# Patient Record
Sex: Female | Born: 1990 | Race: White | Hispanic: No | Marital: Married | Smoking: Former smoker
Health system: Southern US, Community
[De-identification: ages and names within clinical notes are randomized; demographics above are authoritative.]

## PROBLEM LIST (undated history)

## (undated) DIAGNOSIS — J45909 Unspecified asthma, uncomplicated: Secondary | ICD-10-CM

## (undated) DIAGNOSIS — F988 Other specified behavioral and emotional disorders with onset usually occurring in childhood and adolescence: Secondary | ICD-10-CM

## (undated) DIAGNOSIS — K219 Gastro-esophageal reflux disease without esophagitis: Secondary | ICD-10-CM

## (undated) DIAGNOSIS — E119 Type 2 diabetes mellitus without complications: Secondary | ICD-10-CM

## (undated) DIAGNOSIS — K589 Irritable bowel syndrome without diarrhea: Secondary | ICD-10-CM

## (undated) DIAGNOSIS — N915 Oligomenorrhea, unspecified: Secondary | ICD-10-CM

## (undated) DIAGNOSIS — G43909 Migraine, unspecified, not intractable, without status migrainosus: Secondary | ICD-10-CM

## (undated) DIAGNOSIS — F329 Major depressive disorder, single episode, unspecified: Secondary | ICD-10-CM

## (undated) DIAGNOSIS — N390 Urinary tract infection, site not specified: Secondary | ICD-10-CM

## (undated) DIAGNOSIS — F419 Anxiety disorder, unspecified: Secondary | ICD-10-CM

## (undated) DIAGNOSIS — Z22322 Carrier or suspected carrier of Methicillin resistant Staphylococcus aureus: Secondary | ICD-10-CM

## (undated) DIAGNOSIS — K311 Adult hypertrophic pyloric stenosis: Secondary | ICD-10-CM

## (undated) DIAGNOSIS — F32A Depression, unspecified: Secondary | ICD-10-CM

## (undated) HISTORY — DX: Other specified behavioral and emotional disorders with onset usually occurring in childhood and adolescence: F98.8

## (undated) HISTORY — PX: WISDOM TOOTH EXTRACTION: SHX21

## (undated) HISTORY — DX: Anxiety disorder, unspecified: F41.9

## (undated) HISTORY — DX: Oligomenorrhea, unspecified: N91.5

## (undated) HISTORY — DX: Depression, unspecified: F32.A

## (undated) HISTORY — DX: Major depressive disorder, single episode, unspecified: F32.9

## (undated) HISTORY — PX: COLPOSCOPY: SHX161

## (undated) HISTORY — DX: Irritable bowel syndrome, unspecified: K58.9

## (undated) HISTORY — PX: TONSILLECTOMY: SUR1361

## (undated) HISTORY — DX: Gastro-esophageal reflux disease without esophagitis: K21.9

## (undated) HISTORY — PX: DILATION AND CURETTAGE OF UTERUS: SHX78

---

## 2010-05-12 ENCOUNTER — Emergency Department: Payer: Self-pay | Admitting: Emergency Medicine

## 2010-05-23 ENCOUNTER — Emergency Department: Payer: Self-pay | Admitting: Emergency Medicine

## 2010-06-22 ENCOUNTER — Emergency Department (HOSPITAL_COMMUNITY)
Admission: EM | Admit: 2010-06-22 | Discharge: 2010-06-22 | Payer: Self-pay | Source: Home / Self Care | Admitting: Emergency Medicine

## 2010-06-24 LAB — POCT PREGNANCY, URINE: Preg Test, Ur: NEGATIVE

## 2010-07-09 ENCOUNTER — Emergency Department: Payer: Self-pay | Admitting: Emergency Medicine

## 2010-08-16 ENCOUNTER — Emergency Department: Payer: Self-pay | Admitting: Emergency Medicine

## 2010-10-01 ENCOUNTER — Ambulatory Visit: Payer: Self-pay | Admitting: Family Medicine

## 2010-10-21 ENCOUNTER — Ambulatory Visit: Payer: Self-pay | Admitting: Gastroenterology

## 2010-10-22 LAB — PATHOLOGY REPORT

## 2010-10-23 ENCOUNTER — Ambulatory Visit: Payer: Self-pay | Admitting: Gastroenterology

## 2010-11-18 ENCOUNTER — Emergency Department: Payer: Self-pay

## 2010-12-21 ENCOUNTER — Ambulatory Visit: Payer: Self-pay | Admitting: Gastroenterology

## 2011-01-22 ENCOUNTER — Emergency Department: Payer: Self-pay | Admitting: Emergency Medicine

## 2011-04-19 ENCOUNTER — Emergency Department: Payer: Self-pay | Admitting: Emergency Medicine

## 2011-07-22 ENCOUNTER — Emergency Department: Payer: Self-pay | Admitting: Unknown Physician Specialty

## 2011-07-22 LAB — URINALYSIS, COMPLETE
Bacteria: NONE SEEN
Bilirubin,UR: NEGATIVE
Blood: NEGATIVE
Glucose,UR: NEGATIVE mg/dL (ref 0–75)
Ketone: NEGATIVE
Leukocyte Esterase: NEGATIVE
Nitrite: NEGATIVE
Ph: 8 (ref 4.5–8.0)
Protein: NEGATIVE
RBC,UR: 1 /HPF (ref 0–5)
Specific Gravity: 1.006 (ref 1.003–1.030)
Squamous Epithelial: 1
WBC UR: <1 /HPF (ref 0–5)

## 2011-07-22 LAB — PREGNANCY, URINE: Pregnancy Test, Urine: NEGATIVE m[IU]/mL

## 2011-07-23 LAB — COMPREHENSIVE METABOLIC PANEL
Albumin: 3.7 g/dL (ref 3.4–5.0)
Alkaline Phosphatase: 101 U/L (ref 50–136)
Anion Gap: 12 (ref 7–16)
BUN: 11 mg/dL (ref 7–18)
Bilirubin,Total: 0.3 mg/dL (ref 0.2–1.0)
Calcium, Total: 8.7 mg/dL (ref 8.5–10.1)
Chloride: 106 mmol/L (ref 98–107)
Co2: 23 mmol/L (ref 21–32)
Creatinine: 0.79 mg/dL (ref 0.60–1.30)
EGFR (African American): >60
EGFR (Non-African Amer.): >60
Glucose: 99 mg/dL (ref 65–99)
Osmolality: 281 (ref 275–301)
Potassium: 3.9 mmol/L (ref 3.5–5.1)
SGOT(AST): 19 U/L (ref 15–37)
SGPT (ALT): 24 U/L
Sodium: 141 mmol/L (ref 136–145)
Total Protein: 7.5 g/dL (ref 6.4–8.2)

## 2011-07-23 LAB — CBC
HCT: 38.6 % (ref 35.0–47.0)
HGB: 12.9 g/dL (ref 12.0–16.0)
MCH: 27.1 pg (ref 26.0–34.0)
MCHC: 33.4 g/dL (ref 32.0–36.0)
MCV: 81 fL (ref 80–100)
Platelet: 226 10*3/uL (ref 150–440)
RBC: 4.76 10*6/uL (ref 3.80–5.20)
RDW: 15.5 % — ABNORMAL HIGH (ref 11.5–14.5)
WBC: 11.5 10*3/uL — ABNORMAL HIGH (ref 3.6–11.0)

## 2011-07-23 LAB — HCG, QUANTITATIVE, PREGNANCY: Beta Hcg, Quant.: <1 m[IU]/mL — ABNORMAL LOW

## 2011-07-23 LAB — WET PREP, GENITAL

## 2011-08-23 ENCOUNTER — Emergency Department: Payer: Self-pay | Admitting: Internal Medicine

## 2011-08-23 LAB — PREGNANCY, URINE: Pregnancy Test, Urine: NEGATIVE m[IU]/mL

## 2011-10-17 ENCOUNTER — Emergency Department: Payer: Self-pay | Admitting: Emergency Medicine

## 2011-11-01 ENCOUNTER — Emergency Department: Payer: Self-pay | Admitting: Emergency Medicine

## 2011-12-09 ENCOUNTER — Emergency Department: Payer: Self-pay | Admitting: Emergency Medicine

## 2011-12-09 LAB — PREGNANCY, URINE: Pregnancy Test, Urine: NEGATIVE m[IU]/mL

## 2012-01-01 ENCOUNTER — Emergency Department: Payer: Self-pay | Admitting: Emergency Medicine

## 2012-01-01 LAB — URINALYSIS, COMPLETE
Bacteria: NONE SEEN
Bilirubin,UR: NEGATIVE
Blood: NEGATIVE
Glucose,UR: NEGATIVE mg/dL (ref 0–75)
Ketone: NEGATIVE
Leukocyte Esterase: NEGATIVE
Nitrite: NEGATIVE
Ph: 6 (ref 4.5–8.0)
Protein: NEGATIVE
RBC,UR: NONE SEEN /HPF (ref 0–5)
Specific Gravity: 1.016 (ref 1.003–1.030)
Squamous Epithelial: 5
WBC UR: NONE SEEN /HPF (ref 0–5)

## 2012-01-01 LAB — COMPREHENSIVE METABOLIC PANEL
Albumin: 3.5 g/dL (ref 3.4–5.0)
Alkaline Phosphatase: 106 U/L (ref 50–136)
Anion Gap: 9 (ref 7–16)
BUN: 9 mg/dL (ref 7–18)
Bilirubin,Total: 0.2 mg/dL (ref 0.2–1.0)
Calcium, Total: 8.8 mg/dL (ref 8.5–10.1)
Chloride: 107 mmol/L (ref 98–107)
Co2: 25 mmol/L (ref 21–32)
Creatinine: 0.73 mg/dL (ref 0.60–1.30)
EGFR (African American): >60
EGFR (Non-African Amer.): >60
Glucose: 95 mg/dL (ref 65–99)
Osmolality: 280 (ref 275–301)
Potassium: 3.8 mmol/L (ref 3.5–5.1)
SGOT(AST): 16 U/L (ref 15–37)
SGPT (ALT): 19 U/L
Sodium: 141 mmol/L (ref 136–145)
Total Protein: 7.1 g/dL (ref 6.4–8.2)

## 2012-01-01 LAB — CBC
HCT: 37.4 % (ref 35.0–47.0)
HGB: 12.4 g/dL (ref 12.0–16.0)
MCH: 27.2 pg (ref 26.0–34.0)
MCHC: 33.1 g/dL (ref 32.0–36.0)
MCV: 82 fL (ref 80–100)
Platelet: 234 10*3/uL (ref 150–440)
RBC: 4.55 10*6/uL (ref 3.80–5.20)
RDW: 16 % — ABNORMAL HIGH (ref 11.5–14.5)
WBC: 13.5 10*3/uL — ABNORMAL HIGH (ref 3.6–11.0)

## 2012-01-01 LAB — HCG, QUANTITATIVE, PREGNANCY: Beta Hcg, Quant.: 13898 m[IU]/mL — ABNORMAL HIGH

## 2012-01-01 LAB — LIPASE, BLOOD: Lipase: 120 U/L (ref 73–393)

## 2012-01-04 ENCOUNTER — Emergency Department: Payer: Self-pay | Admitting: Emergency Medicine

## 2012-01-04 LAB — HCG, QUANTITATIVE, PREGNANCY: Beta Hcg, Quant.: 21284 m[IU]/mL — ABNORMAL HIGH

## 2012-01-04 LAB — CBC
HCT: 38.3 % (ref 35.0–47.0)
HGB: 13 g/dL (ref 12.0–16.0)
MCH: 27.8 pg (ref 26.0–34.0)
MCHC: 33.9 g/dL (ref 32.0–36.0)
MCV: 82 fL (ref 80–100)
Platelet: 241 10*3/uL (ref 150–440)
RBC: 4.67 10*6/uL (ref 3.80–5.20)
RDW: 15.4 % — ABNORMAL HIGH (ref 11.5–14.5)
WBC: 10.2 10*3/uL (ref 3.6–11.0)

## 2012-01-05 LAB — URINALYSIS, COMPLETE
Bilirubin,UR: NEGATIVE
Glucose,UR: NEGATIVE mg/dL (ref 0–75)
Ketone: NEGATIVE
Leukocyte Esterase: NEGATIVE
Nitrite: NEGATIVE
Ph: 6 (ref 4.5–8.0)
Protein: NEGATIVE
RBC,UR: <1 /HPF (ref 0–5)
Specific Gravity: 1.01 (ref 1.003–1.030)
Squamous Epithelial: NONE SEEN
WBC UR: 1 /HPF (ref 0–5)

## 2012-01-13 ENCOUNTER — Encounter: Payer: Self-pay | Admitting: Obstetrics and Gynecology

## 2012-01-28 ENCOUNTER — Emergency Department: Payer: Self-pay | Admitting: Unknown Physician Specialty

## 2012-01-29 LAB — URINALYSIS, COMPLETE
Bacteria: NONE SEEN
Bilirubin,UR: NEGATIVE
Blood: NEGATIVE
Glucose,UR: NEGATIVE mg/dL (ref 0–75)
Ketone: NEGATIVE
Leukocyte Esterase: NEGATIVE
Nitrite: NEGATIVE
Ph: 6 (ref 4.5–8.0)
Protein: NEGATIVE
RBC,UR: <1 /HPF (ref 0–5)
Specific Gravity: 1.004 (ref 1.003–1.030)
Squamous Epithelial: <1
WBC UR: NONE SEEN /HPF (ref 0–5)

## 2012-01-29 LAB — COMPREHENSIVE METABOLIC PANEL
Albumin: 3.5 g/dL (ref 3.4–5.0)
Alkaline Phosphatase: 84 U/L (ref 50–136)
Anion Gap: 8 (ref 7–16)
BUN: 9 mg/dL (ref 7–18)
Bilirubin,Total: 0.1 mg/dL — ABNORMAL LOW (ref 0.2–1.0)
Calcium, Total: 9.1 mg/dL (ref 8.5–10.1)
Chloride: 107 mmol/L (ref 98–107)
Co2: 24 mmol/L (ref 21–32)
Creatinine: 0.62 mg/dL (ref 0.60–1.30)
EGFR (African American): >60
EGFR (Non-African Amer.): >60
Glucose: 88 mg/dL (ref 65–99)
Osmolality: 276 (ref 275–301)
Potassium: 3.8 mmol/L (ref 3.5–5.1)
SGOT(AST): 19 U/L (ref 15–37)
SGPT (ALT): 21 U/L (ref 12–78)
Sodium: 139 mmol/L (ref 136–145)
Total Protein: 7.4 g/dL (ref 6.4–8.2)

## 2012-01-29 LAB — HCG, QUANTITATIVE, PREGNANCY: Beta Hcg, Quant.: 33403 m[IU]/mL — ABNORMAL HIGH

## 2012-01-29 LAB — PREGNANCY, URINE: Pregnancy Test, Urine: POSITIVE m[IU]/mL

## 2012-01-29 LAB — CBC
HCT: 37.5 % (ref 35.0–47.0)
HGB: 12.4 g/dL (ref 12.0–16.0)
MCH: 27.2 pg (ref 26.0–34.0)
MCHC: 33.1 g/dL (ref 32.0–36.0)
MCV: 82 fL (ref 80–100)
Platelet: 239 10*3/uL (ref 150–440)
RBC: 4.57 10*6/uL (ref 3.80–5.20)
RDW: 15.4 % — ABNORMAL HIGH (ref 11.5–14.5)
WBC: 13.2 10*3/uL — ABNORMAL HIGH (ref 3.6–11.0)

## 2012-03-01 ENCOUNTER — Emergency Department: Payer: Self-pay | Admitting: Emergency Medicine

## 2012-03-01 LAB — CBC
HCT: 36.5 % (ref 35.0–47.0)
HGB: 12.5 g/dL (ref 12.0–16.0)
MCH: 28.2 pg (ref 26.0–34.0)
MCHC: 34.3 g/dL (ref 32.0–36.0)
MCV: 82 fL (ref 80–100)
Platelet: 214 10*3/uL (ref 150–440)
RBC: 4.44 10*6/uL (ref 3.80–5.20)
RDW: 15.1 % — ABNORMAL HIGH (ref 11.5–14.5)
WBC: 12.7 10*3/uL — ABNORMAL HIGH (ref 3.6–11.0)

## 2012-03-01 LAB — URINALYSIS, COMPLETE
Bilirubin,UR: NEGATIVE
Blood: NEGATIVE
Glucose,UR: NEGATIVE mg/dL (ref 0–75)
Ketone: NEGATIVE
Leukocyte Esterase: NEGATIVE
Nitrite: NEGATIVE
Ph: 6 (ref 4.5–8.0)
Protein: NEGATIVE
RBC,UR: 4 /HPF (ref 0–5)
Specific Gravity: 1.016 (ref 1.003–1.030)
Squamous Epithelial: 1
WBC UR: 1 /HPF (ref 0–5)

## 2012-03-01 LAB — HCG, QUANTITATIVE, PREGNANCY: Beta Hcg, Quant.: 19413 m[IU]/mL — ABNORMAL HIGH

## 2012-03-22 ENCOUNTER — Emergency Department: Payer: Self-pay | Admitting: Emergency Medicine

## 2012-03-22 LAB — COMPREHENSIVE METABOLIC PANEL
Albumin: 3.1 g/dL — ABNORMAL LOW (ref 3.4–5.0)
Alkaline Phosphatase: 86 U/L (ref 50–136)
Anion Gap: 10 (ref 7–16)
BUN: 5 mg/dL — ABNORMAL LOW (ref 7–18)
Bilirubin,Total: 0.2 mg/dL (ref 0.2–1.0)
Calcium, Total: 9.1 mg/dL (ref 8.5–10.1)
Chloride: 109 mmol/L — ABNORMAL HIGH (ref 98–107)
Co2: 22 mmol/L (ref 21–32)
Creatinine: 0.43 mg/dL — ABNORMAL LOW (ref 0.60–1.30)
EGFR (African American): >60
EGFR (Non-African Amer.): >60
Glucose: 84 mg/dL (ref 65–99)
Osmolality: 278 (ref 275–301)
Potassium: 3.9 mmol/L (ref 3.5–5.1)
SGOT(AST): 6 U/L — ABNORMAL LOW (ref 15–37)
SGPT (ALT): 13 U/L (ref 12–78)
Sodium: 141 mmol/L (ref 136–145)
Total Protein: 7.1 g/dL (ref 6.4–8.2)

## 2012-03-22 LAB — URINALYSIS, COMPLETE
Bacteria: NONE SEEN
Bilirubin,UR: NEGATIVE
Blood: NEGATIVE
Glucose,UR: NEGATIVE mg/dL (ref 0–75)
Ketone: NEGATIVE
Leukocyte Esterase: NEGATIVE
Nitrite: NEGATIVE
Ph: 8 (ref 4.5–8.0)
Protein: NEGATIVE
RBC,UR: NONE SEEN /HPF (ref 0–5)
Specific Gravity: 1.014 (ref 1.003–1.030)
Squamous Epithelial: 1
WBC UR: NONE SEEN /HPF (ref 0–5)

## 2012-03-22 LAB — CBC
HCT: 37.5 % (ref 35.0–47.0)
HGB: 12.5 g/dL (ref 12.0–16.0)
MCH: 27.6 pg (ref 26.0–34.0)
MCHC: 33.3 g/dL (ref 32.0–36.0)
MCV: 83 fL (ref 80–100)
Platelet: 223 10*3/uL (ref 150–440)
RBC: 4.53 10*6/uL (ref 3.80–5.20)
RDW: 15.9 % — ABNORMAL HIGH (ref 11.5–14.5)
WBC: 12.5 10*3/uL — ABNORMAL HIGH (ref 3.6–11.0)

## 2012-03-22 LAB — LIPASE, BLOOD: Lipase: 127 U/L (ref 73–393)

## 2012-04-13 ENCOUNTER — Ambulatory Visit: Payer: Self-pay | Admitting: Obstetrics & Gynecology

## 2012-05-04 ENCOUNTER — Observation Stay: Payer: Self-pay

## 2012-05-24 ENCOUNTER — Observation Stay: Payer: Self-pay

## 2012-05-25 LAB — URINALYSIS, COMPLETE
Bilirubin,UR: NEGATIVE
Blood: NEGATIVE
Glucose,UR: NEGATIVE mg/dL (ref 0–75)
Ketone: NEGATIVE
Leukocyte Esterase: NEGATIVE
Nitrite: NEGATIVE
Ph: 7 (ref 4.5–8.0)
Protein: NEGATIVE
RBC,UR: 1 /HPF (ref 0–5)
Specific Gravity: 1.011 (ref 1.003–1.030)
Squamous Epithelial: <1
WBC UR: <1 /HPF (ref 0–5)

## 2012-05-28 ENCOUNTER — Ambulatory Visit: Payer: Self-pay | Admitting: Neurology

## 2012-05-30 ENCOUNTER — Observation Stay: Payer: Self-pay | Admitting: Obstetrics and Gynecology

## 2012-05-30 LAB — URINALYSIS, COMPLETE
Bacteria: NONE SEEN
Bilirubin,UR: NEGATIVE
Blood: NEGATIVE
Glucose,UR: NEGATIVE mg/dL (ref 0–75)
Ketone: NEGATIVE
Leukocyte Esterase: NEGATIVE
Nitrite: NEGATIVE
Ph: 6 (ref 4.5–8.0)
Protein: NEGATIVE
RBC,UR: 1 /HPF (ref 0–5)
Specific Gravity: 1.021 (ref 1.003–1.030)
Squamous Epithelial: <1
WBC UR: <1 /HPF (ref 0–5)

## 2012-06-09 ENCOUNTER — Observation Stay: Payer: Self-pay | Admitting: Obstetrics and Gynecology

## 2012-06-10 LAB — URINALYSIS, COMPLETE
Bacteria: NONE SEEN
Bilirubin,UR: NEGATIVE
Blood: NEGATIVE
Glucose,UR: NEGATIVE mg/dL (ref 0–75)
Leukocyte Esterase: NEGATIVE
Nitrite: NEGATIVE
Ph: 6 (ref 4.5–8.0)
Protein: NEGATIVE
RBC,UR: <1 /HPF (ref 0–5)
Specific Gravity: 1.01 (ref 1.003–1.030)
Squamous Epithelial: 1
WBC UR: 1 /HPF (ref 0–5)

## 2012-06-10 LAB — FETAL FIBRONECTIN
Appearance: NORMAL
Fetal Fibronectin: NEGATIVE

## 2012-06-15 ENCOUNTER — Observation Stay: Payer: Self-pay

## 2012-06-22 ENCOUNTER — Observation Stay: Payer: Self-pay | Admitting: Obstetrics & Gynecology

## 2012-06-26 ENCOUNTER — Observation Stay: Payer: Self-pay

## 2012-07-04 ENCOUNTER — Observation Stay: Payer: Self-pay | Admitting: Obstetrics and Gynecology

## 2012-07-08 ENCOUNTER — Observation Stay: Payer: Self-pay | Admitting: Obstetrics and Gynecology

## 2012-07-10 ENCOUNTER — Observation Stay: Payer: Self-pay

## 2012-07-11 ENCOUNTER — Observation Stay: Payer: Self-pay | Admitting: Obstetrics & Gynecology

## 2012-07-15 IMAGING — NM NUCLEAR MEDICINE HEPATOHBILIARY INCLUDE GB
2 series · 12 of 12 positions shown · non-contrast
Comparison: none

REASON FOR EXAM: abd pain
COMMENTS:

[Series 1000: gallbladder dynamic (results) · 4.80mm/px · 6 of 60 frames shown]
[frame 6/60]
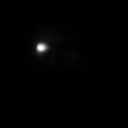
[frame 16/60]
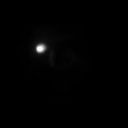
[frame 26/60]
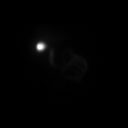
[frame 36/60]
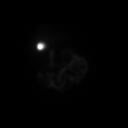
[frame 46/60]
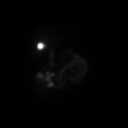
[frame 56/60]
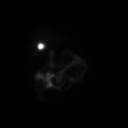

[Series 1000: gallbladder dynamic · 4.80mm/px · 6 of 60 frames shown]
[frame 6/60]
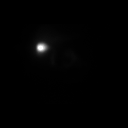
[frame 16/60]
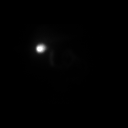
[frame 26/60]
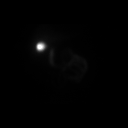
[frame 36/60]
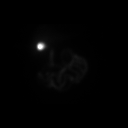
[frame 46/60]
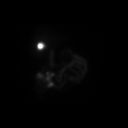
[frame 56/60]
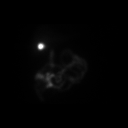

[12 of 12 positions shown; findings below may reference images not displayed]

PROCEDURE:     KNM - KNM HEPATO W/GB EJECT FRACTION  - October 23, 2010 [DATE]

RESULT:     The patient received a dose of 8.52 mCi of technetium 99m
Choletec. Images demonstrate prompt extraction of tracer from the blood pool
by the liver with gallbladder activity first seen by 15 minutes after
injection with common bile duct activity present by 15 to 20 minutes after
injection. Bowel localization is present by approximately 50 to 60 minutes
after injection. A continuous infusion of sincalide was initiated with a
total dose of 2.31 mcg administered intravenously. The gallbladder ejection
fraction in response to sincalide infusion is 69%.
IMPRESSION: 1.     Normal-appearing hepatobiliary scan and gallbladder ejection fraction.

## 2012-07-24 ENCOUNTER — Observation Stay: Payer: Self-pay

## 2012-08-01 ENCOUNTER — Observation Stay: Payer: Self-pay | Admitting: Obstetrics & Gynecology

## 2012-08-04 ENCOUNTER — Observation Stay: Payer: Self-pay | Admitting: Obstetrics and Gynecology

## 2012-08-08 ENCOUNTER — Observation Stay: Payer: Self-pay | Admitting: Obstetrics & Gynecology

## 2012-08-12 ENCOUNTER — Observation Stay: Payer: Self-pay | Admitting: Obstetrics and Gynecology

## 2012-08-13 ENCOUNTER — Inpatient Hospital Stay: Payer: Self-pay | Admitting: Obstetrics and Gynecology

## 2012-08-13 LAB — CBC WITH DIFFERENTIAL/PLATELET
Basophil #: 0.1 10*3/uL (ref 0.0–0.1)
Basophil %: 0.4 %
Eosinophil #: 0.1 10*3/uL (ref 0.0–0.7)
Eosinophil %: 0.3 %
HCT: 34.8 % — ABNORMAL LOW (ref 35.0–47.0)
HGB: 11.4 g/dL — ABNORMAL LOW (ref 12.0–16.0)
Lymphocyte #: 2.1 10*3/uL (ref 1.0–3.6)
Lymphocyte %: 12.2 %
MCH: 26 pg (ref 26.0–34.0)
MCHC: 32.7 g/dL (ref 32.0–36.0)
MCV: 79 fL — ABNORMAL LOW (ref 80–100)
Monocyte #: 0.8 x10 3/mm (ref 0.2–0.9)
Monocyte %: 4.5 %
Neutrophil #: 14.3 10*3/uL — ABNORMAL HIGH (ref 1.4–6.5)
Neutrophil %: 82.6 %
Platelet: 273 10*3/uL (ref 150–440)
RBC: 4.38 10*6/uL (ref 3.80–5.20)
RDW: 15.1 % — ABNORMAL HIGH (ref 11.5–14.5)
WBC: 17.3 10*3/uL — ABNORMAL HIGH (ref 3.6–11.0)

## 2012-08-15 LAB — HEMATOCRIT: HCT: 30 % — ABNORMAL LOW (ref 35.0–47.0)

## 2012-08-27 ENCOUNTER — Emergency Department: Payer: Self-pay | Admitting: Emergency Medicine

## 2013-02-19 ENCOUNTER — Ambulatory Visit: Payer: Self-pay | Admitting: Specialist

## 2013-04-20 ENCOUNTER — Emergency Department: Payer: Self-pay | Admitting: Emergency Medicine

## 2013-04-20 LAB — COMPREHENSIVE METABOLIC PANEL
Albumin: 3.5 g/dL (ref 3.4–5.0)
Alkaline Phosphatase: 113 U/L (ref 50–136)
Anion Gap: 4 — ABNORMAL LOW (ref 7–16)
BUN: 16 mg/dL (ref 7–18)
Bilirubin,Total: 0.4 mg/dL (ref 0.2–1.0)
Calcium, Total: 8.8 mg/dL (ref 8.5–10.1)
Chloride: 112 mmol/L — ABNORMAL HIGH (ref 98–107)
Co2: 23 mmol/L (ref 21–32)
Creatinine: 0.78 mg/dL (ref 0.60–1.30)
EGFR (African American): >60
EGFR (Non-African Amer.): >60
Glucose: 144 mg/dL — ABNORMAL HIGH (ref 65–99)
Osmolality: 281 (ref 275–301)
Potassium: 3.4 mmol/L — ABNORMAL LOW (ref 3.5–5.1)
SGOT(AST): 17 U/L (ref 15–37)
SGPT (ALT): 23 U/L (ref 12–78)
Sodium: 139 mmol/L (ref 136–145)
Total Protein: 7.5 g/dL (ref 6.4–8.2)

## 2013-04-20 LAB — URINALYSIS, COMPLETE
Bilirubin,UR: NEGATIVE
Glucose,UR: NEGATIVE mg/dL (ref 0–75)
Ketone: NEGATIVE
Leukocyte Esterase: NEGATIVE
Nitrite: NEGATIVE
Ph: 5 (ref 4.5–8.0)
Protein: 30
RBC,UR: 7 /HPF (ref 0–5)
Specific Gravity: 1.025 (ref 1.003–1.030)
Squamous Epithelial: 1
WBC UR: 3 /HPF (ref 0–5)

## 2013-04-20 LAB — CBC
HCT: 37.3 % (ref 35.0–47.0)
HGB: 12.1 g/dL (ref 12.0–16.0)
MCH: 24.4 pg — ABNORMAL LOW (ref 26.0–34.0)
MCHC: 32.4 g/dL (ref 32.0–36.0)
MCV: 75 fL — ABNORMAL LOW (ref 80–100)
Platelet: 294 10*3/uL (ref 150–440)
RBC: 4.95 10*6/uL (ref 3.80–5.20)
RDW: 18.4 % — ABNORMAL HIGH (ref 11.5–14.5)
WBC: 10.3 10*3/uL (ref 3.6–11.0)

## 2013-04-20 LAB — PREGNANCY, URINE: Pregnancy Test, Urine: NEGATIVE m[IU]/mL

## 2013-04-20 LAB — GC/CHLAMYDIA PROBE AMP

## 2013-04-20 LAB — WET PREP, GENITAL

## 2013-04-27 ENCOUNTER — Emergency Department: Payer: Self-pay | Admitting: Emergency Medicine

## 2013-04-27 LAB — BASIC METABOLIC PANEL
Anion Gap: 6 — ABNORMAL LOW (ref 7–16)
BUN: 12 mg/dL (ref 7–18)
Calcium, Total: 8.6 mg/dL (ref 8.5–10.1)
Chloride: 110 mmol/L — ABNORMAL HIGH (ref 98–107)
Co2: 25 mmol/L (ref 21–32)
Creatinine: 0.84 mg/dL (ref 0.60–1.30)
EGFR (African American): >60
EGFR (Non-African Amer.): >60
Glucose: 104 mg/dL — ABNORMAL HIGH (ref 65–99)
Osmolality: 281 (ref 275–301)
Potassium: 3.8 mmol/L (ref 3.5–5.1)
Sodium: 141 mmol/L (ref 136–145)

## 2013-04-27 LAB — CBC
HCT: 36.9 % (ref 35.0–47.0)
HGB: 12.2 g/dL (ref 12.0–16.0)
MCH: 24.7 pg — ABNORMAL LOW (ref 26.0–34.0)
MCHC: 32.9 g/dL (ref 32.0–36.0)
MCV: 75 fL — ABNORMAL LOW (ref 80–100)
Platelet: 268 10*3/uL (ref 150–440)
RBC: 4.92 10*6/uL (ref 3.80–5.20)
RDW: 17.5 % — ABNORMAL HIGH (ref 11.5–14.5)
WBC: 10.1 10*3/uL (ref 3.6–11.0)

## 2013-04-27 LAB — TROPONIN I: Troponin-I: <0.02 ng/mL

## 2013-05-16 DIAGNOSIS — F32A Depression, unspecified: Secondary | ICD-10-CM | POA: Insufficient documentation

## 2013-05-16 DIAGNOSIS — F419 Anxiety disorder, unspecified: Secondary | ICD-10-CM | POA: Insufficient documentation

## 2013-06-07 HISTORY — PX: HYSTEROSCOPY: SHX211

## 2013-06-30 ENCOUNTER — Emergency Department: Payer: Self-pay | Admitting: Emergency Medicine

## 2013-07-03 DIAGNOSIS — K219 Gastro-esophageal reflux disease without esophagitis: Secondary | ICD-10-CM | POA: Insufficient documentation

## 2013-07-03 DIAGNOSIS — K5904 Chronic idiopathic constipation: Secondary | ICD-10-CM | POA: Insufficient documentation

## 2013-07-31 DIAGNOSIS — J329 Chronic sinusitis, unspecified: Secondary | ICD-10-CM | POA: Insufficient documentation

## 2013-07-31 DIAGNOSIS — J069 Acute upper respiratory infection, unspecified: Secondary | ICD-10-CM | POA: Insufficient documentation

## 2013-09-27 IMAGING — US US OB < 14 WEEKS - US OB TV
1 series · 13 of 28 positions shown · non-contrast
Comparison: none

REASON FOR EXAM: APPROX 7 WEEKS, VAG SPOTTING
COMMENTS:   May transport without cardiac monitor

PROCEDURE:     US  - US OB LESS THAN 14 WEEKS/W TRANS  - January 05, 2012  [DATE]
RESULT:     Comparison: None.
TECHNIQUE: Multiple grayscale and color Doppler images were obtained of the
pelvis via transabdominal and endovaginal ultrasound.

[Series 1: us ob < 14 weeks - us ob tv · 0.26mm/px · 83 acquisitions, 13 frames shown]
[im 4/83]
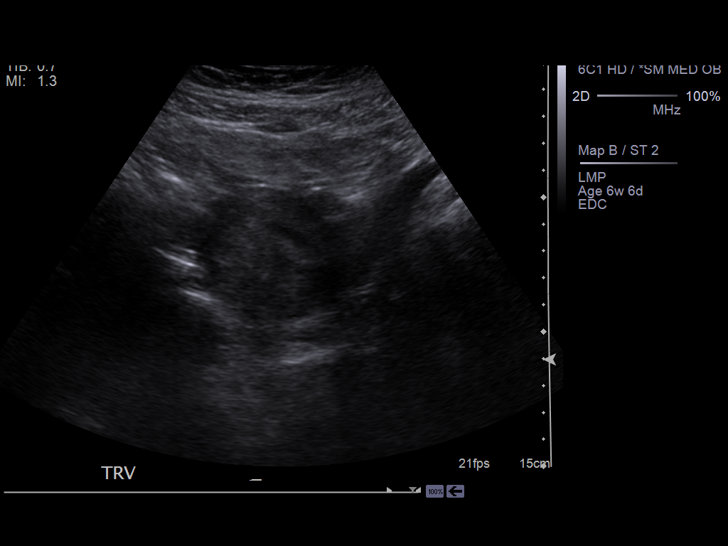
[im 10/83]
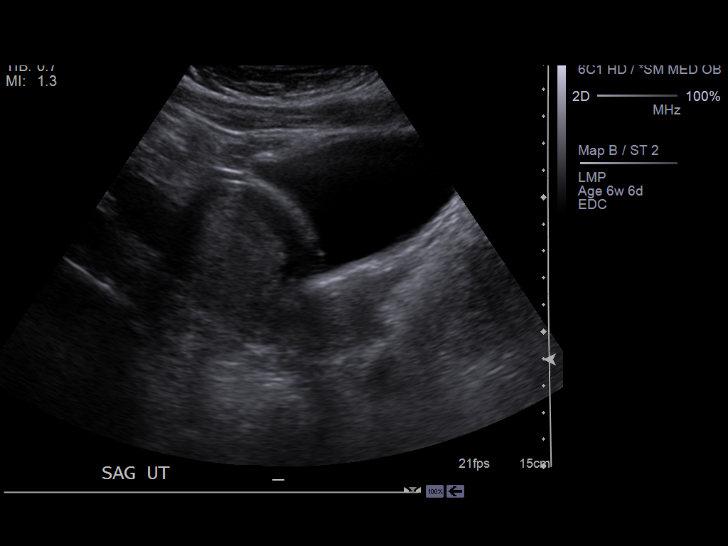
[im 16/83]
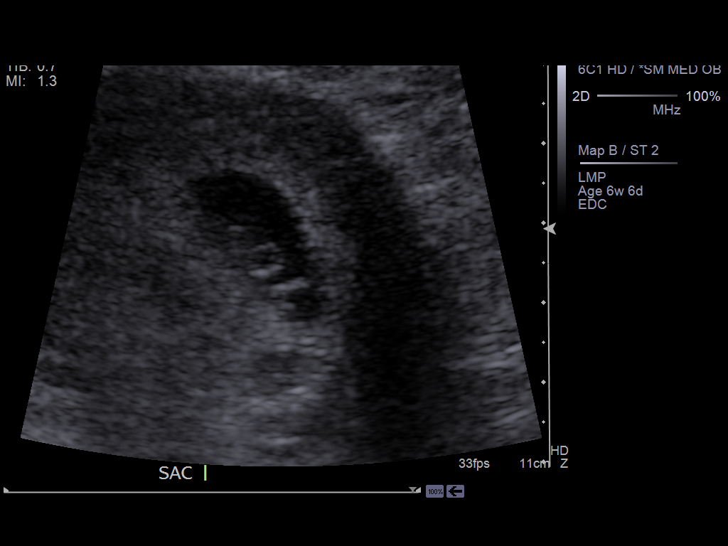
[im 22/83]
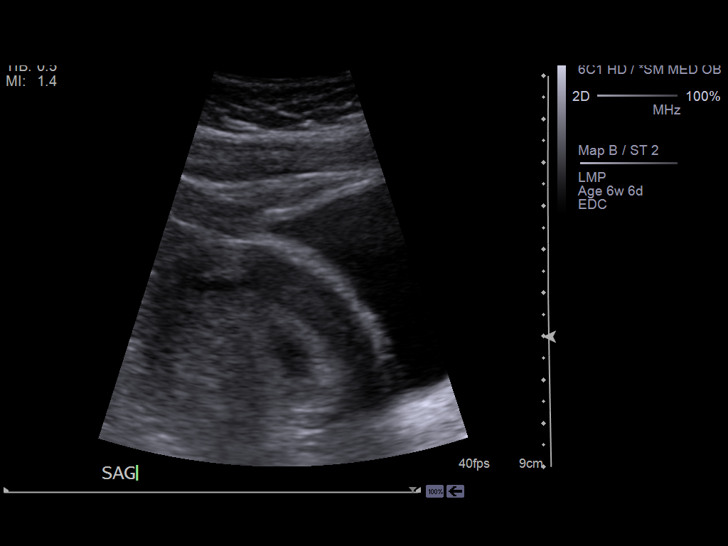
[im 28/83]
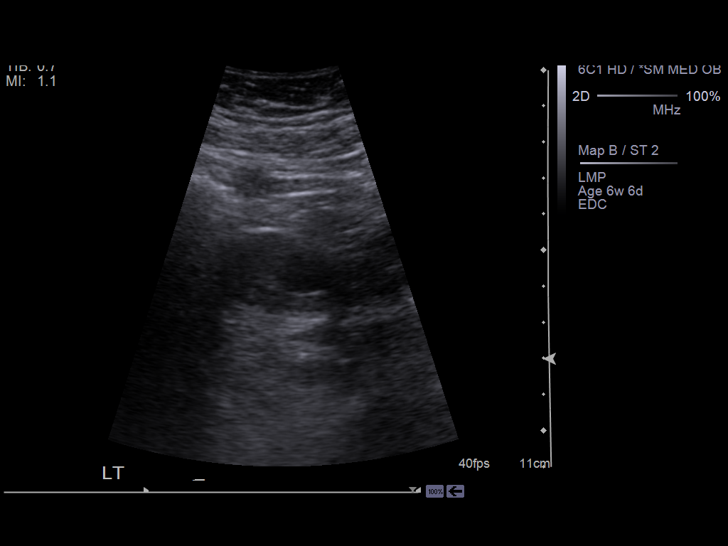
[im 34/83]
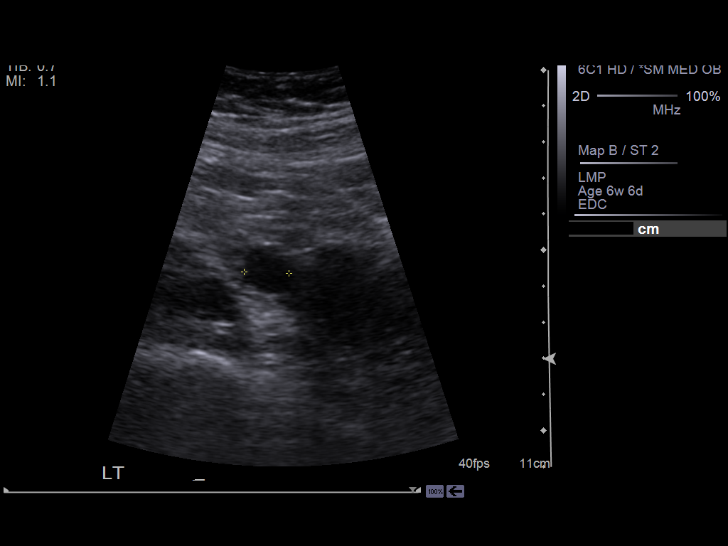
[im 43/83]
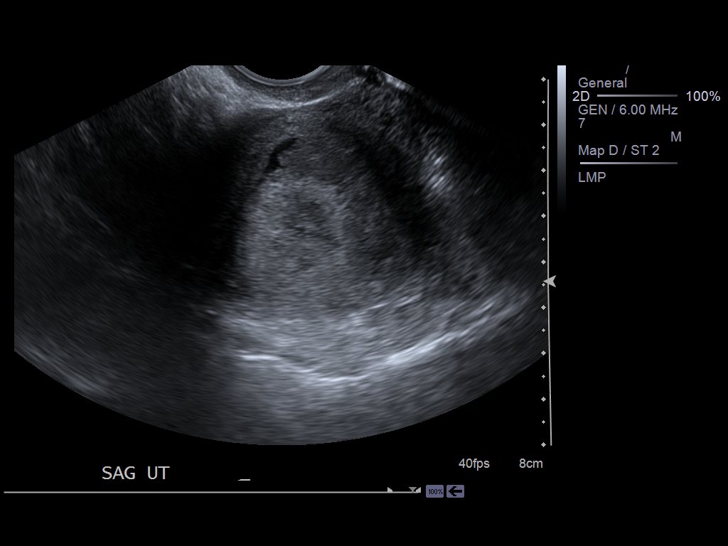
[im 49/83]
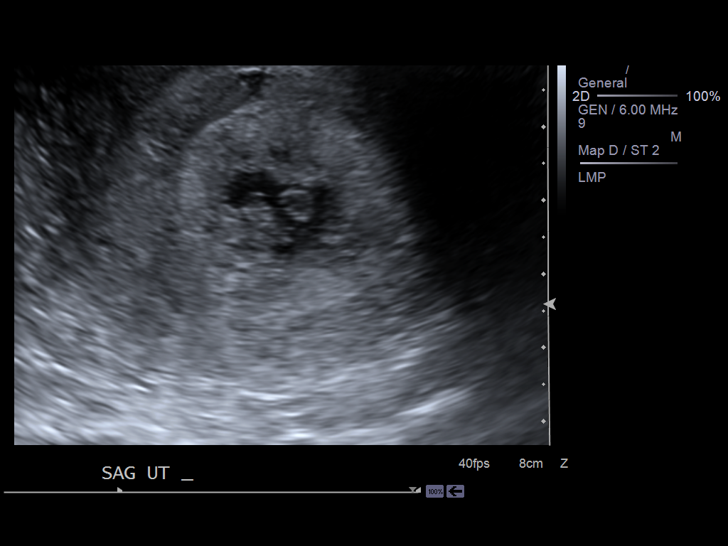
[im 55/83]
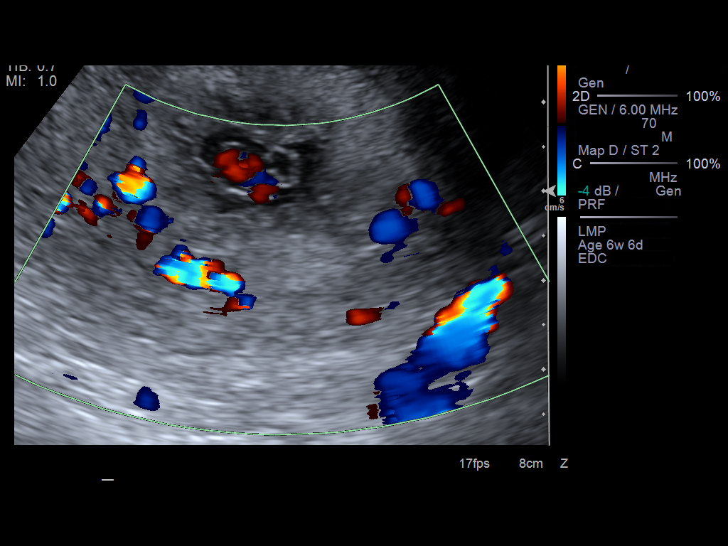
[im 61/83]
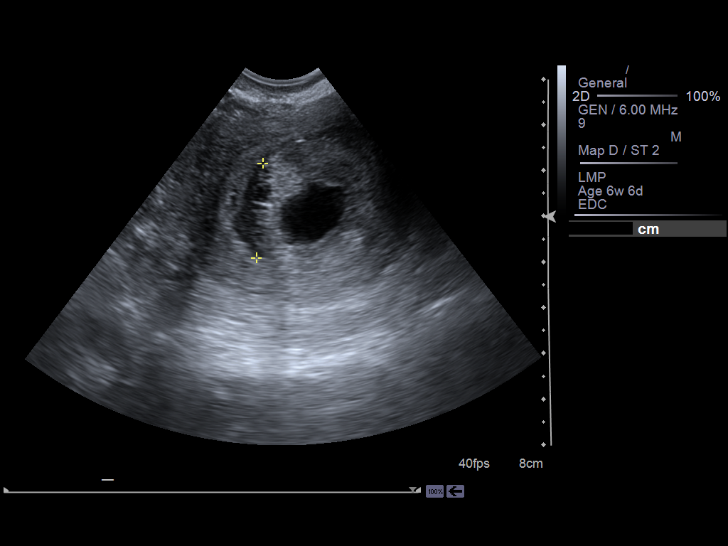
[im 67/83]
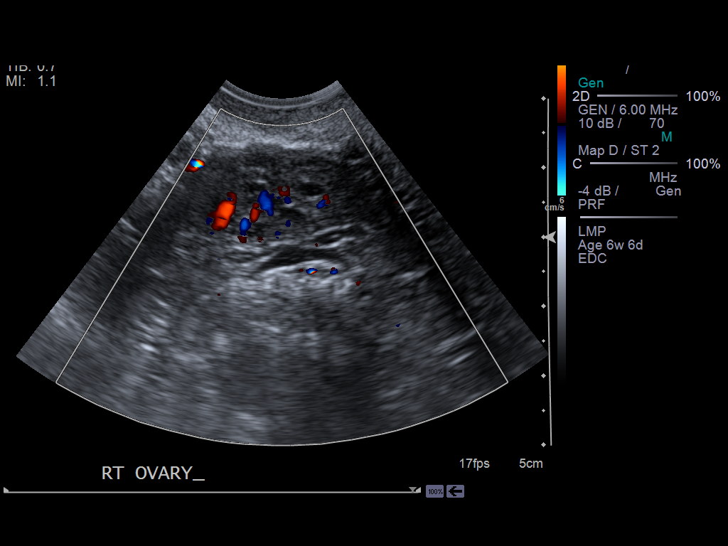
[im 73/83]
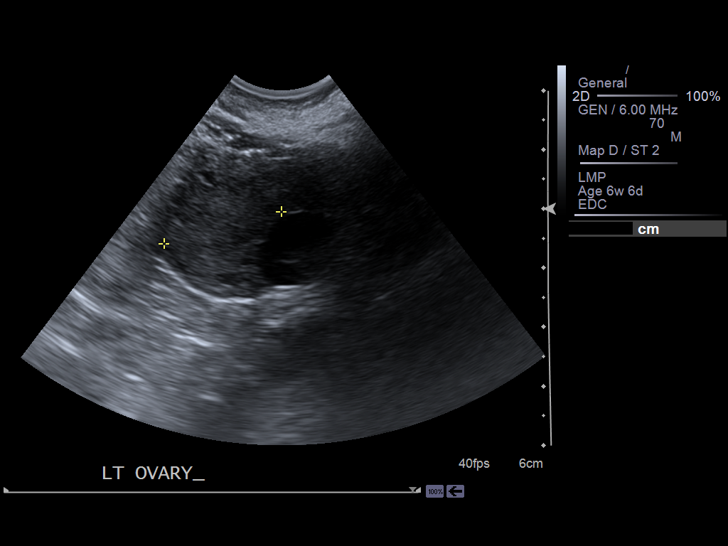
[im 79/83]
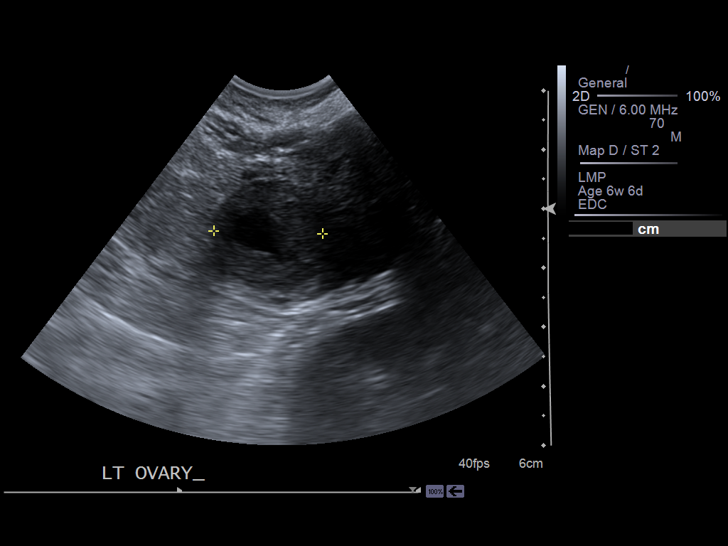

[13 of 28 positions shown; findings below may reference images not displayed]

FINDINGS: There is a gestational sac within the pelvis. A fetal pole is present, with
crown-rump length of 0.82 cm. This correlates with estimated gestational age
of 6 weeks 5 days. A yolk sac is present. Fetal heart rate was 131 beats per
minute. Small to moderate sized hypoechoic collection along the periphery of
the gestational sac likely represents a subchorionic hemorrhage. It measures
2.1 x 2.1 x 0.9 cm.

The right ovary measures 2.8 x 1.2 x 1.8 cm. The left ovary measures 2.1 x
1.6 x 1.8 cm. There is a complex cyst in the left ovary which likely
represents the corpus luteum cyst. There is a hypoechoic structure in the
left ovary which may represent a complicated cyst. It measures 1.6 x 1.5 x
1.1 cm.
IMPRESSION: 1. Single live intrauterine pregnancy with estimated gestational age of 6
weeks 5 days.
2. Small to moderate-sized subchorionic hemorrhage. Followup ultrasound is
suggested.
3. There is what likely represents a small complicated cyst in the left
ovary, in addition to the corpus luteum cyst. Followup ultrasound is
suggested to ensure resolution.

## 2013-10-19 ENCOUNTER — Ambulatory Visit: Payer: Self-pay | Admitting: Obstetrics and Gynecology

## 2013-10-27 ENCOUNTER — Emergency Department: Payer: Self-pay | Admitting: Emergency Medicine

## 2013-11-20 DIAGNOSIS — G4700 Insomnia, unspecified: Secondary | ICD-10-CM | POA: Insufficient documentation

## 2013-12-14 IMAGING — US US OB LIMITED
1 series · 14 of 24 positions shown · non-contrast
Comparison: none

REASON FOR EXAM: 18 weeks with vomiting and pelvic pain
COMMENTS:   May transport without cardiac monitor

[Series 1: us ob limited · 0.26mm/px · 14 of 24 slices shown]
[im 1/24]
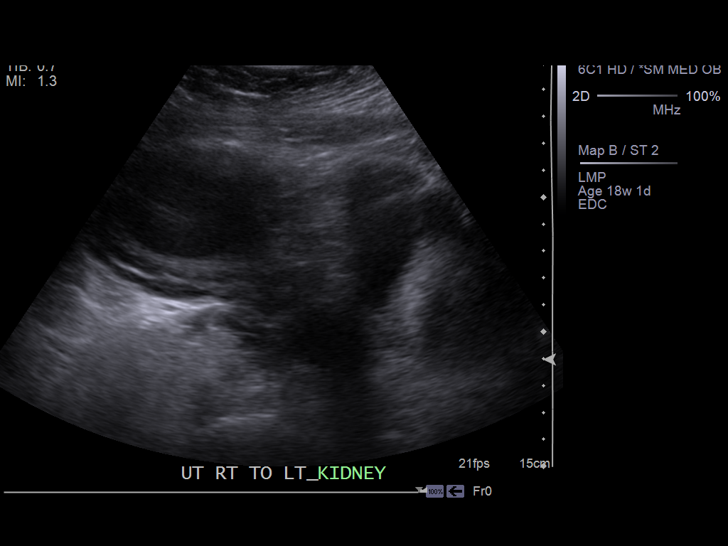
[im 3/24]
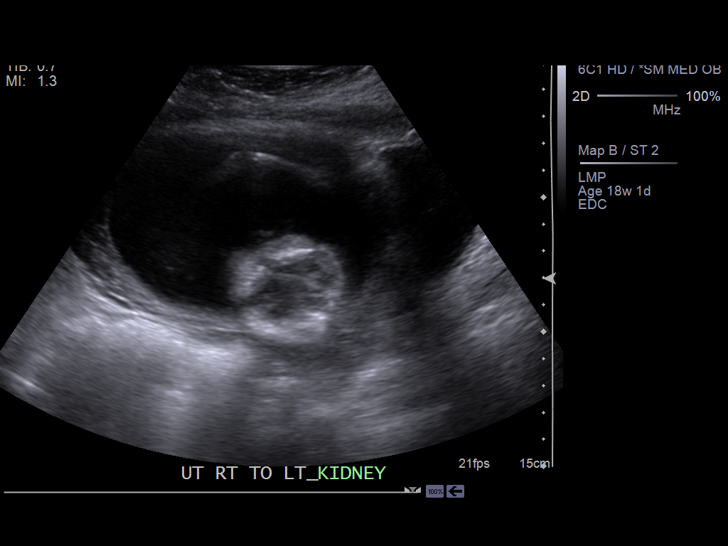
[im 5/24]
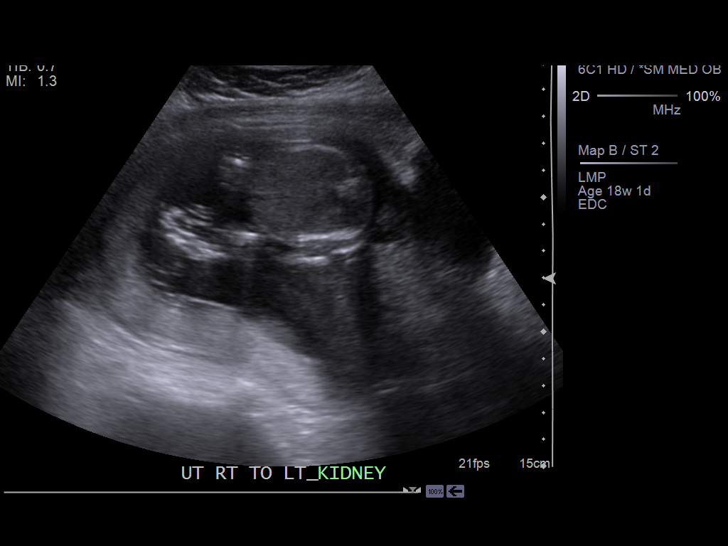
[im 7/24]
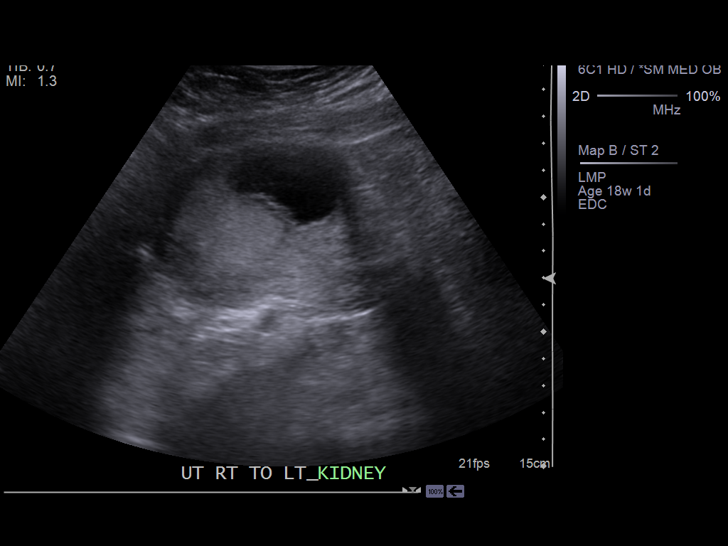
[im 8/24]
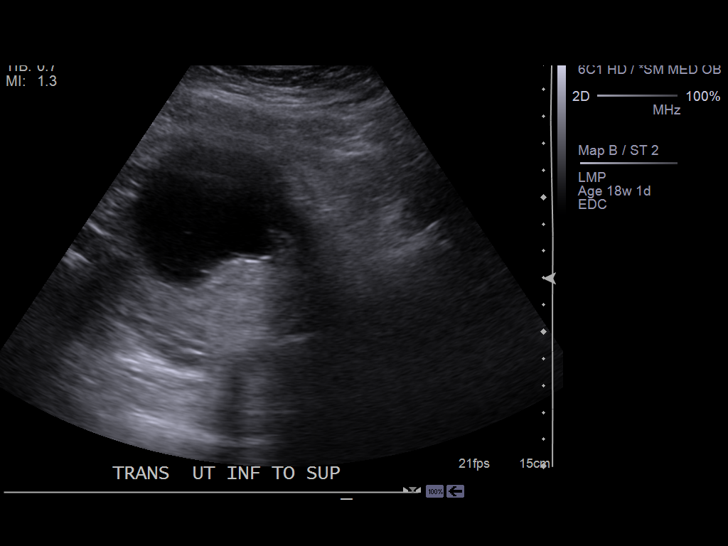
[im 10/24]
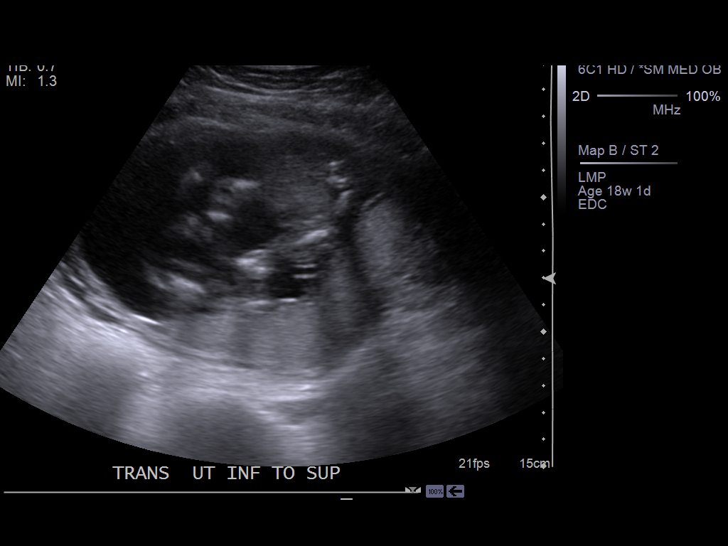
[im 12/24]
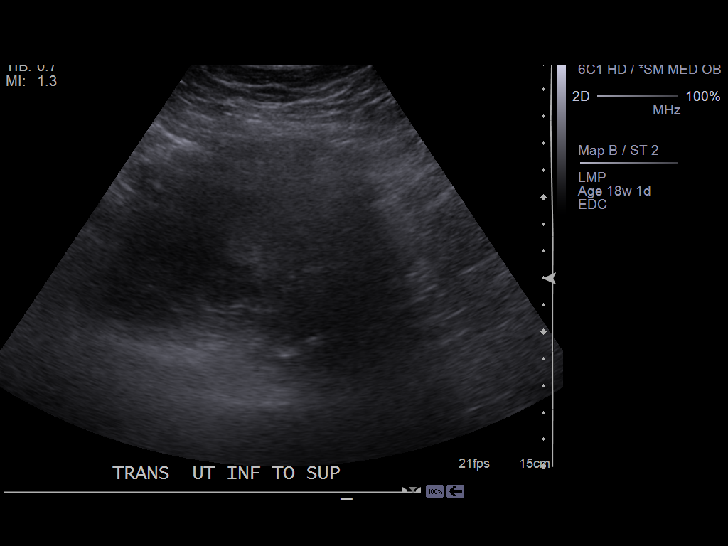
[im 13/24]
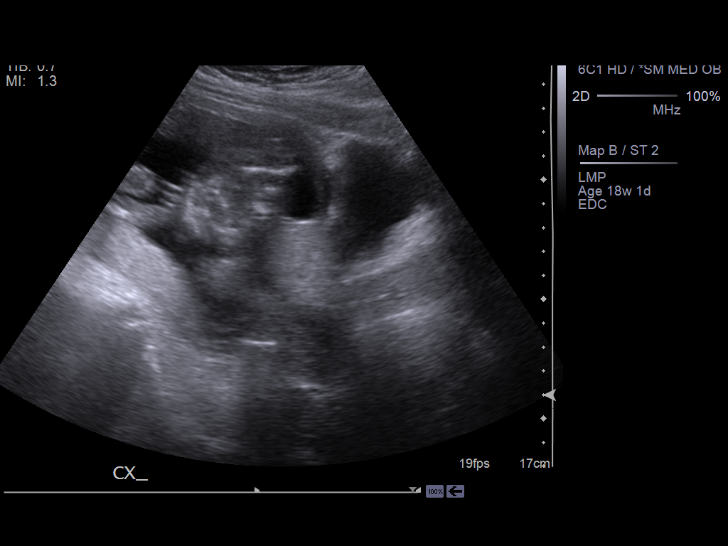
[im 15/24]
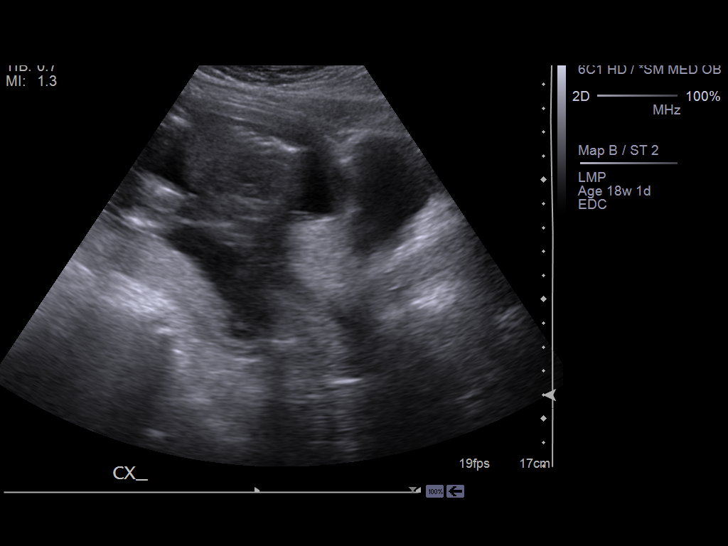
[im 17/24]
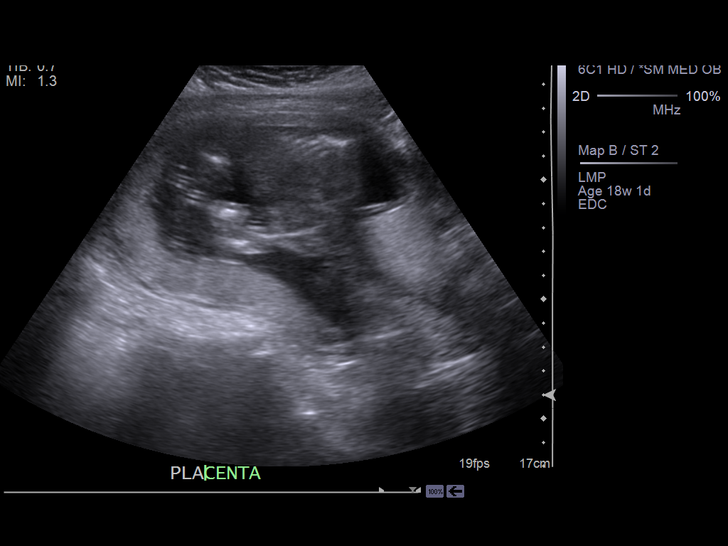
[im 19/24]
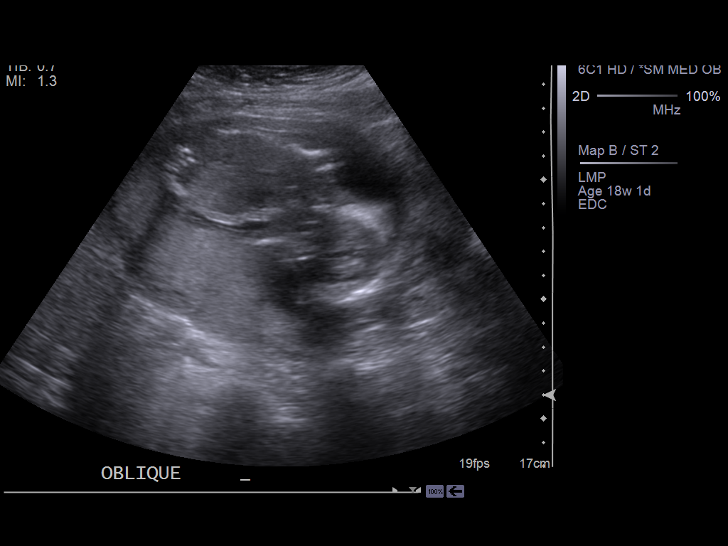
[im 20/24]
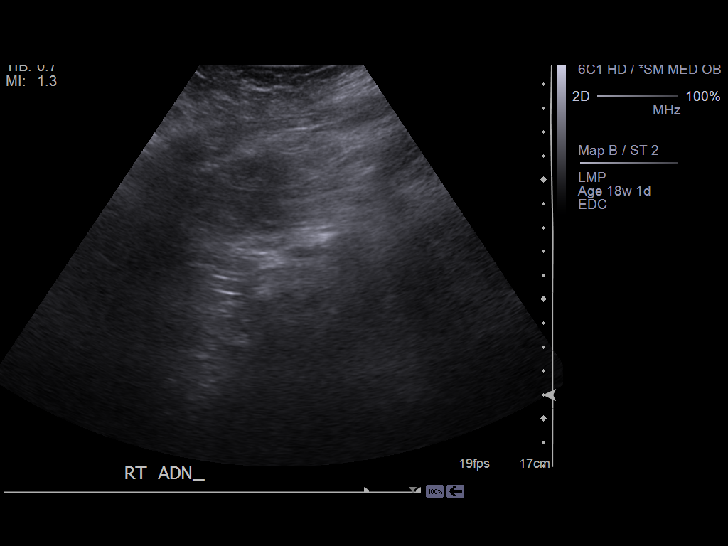
[im 22/24]
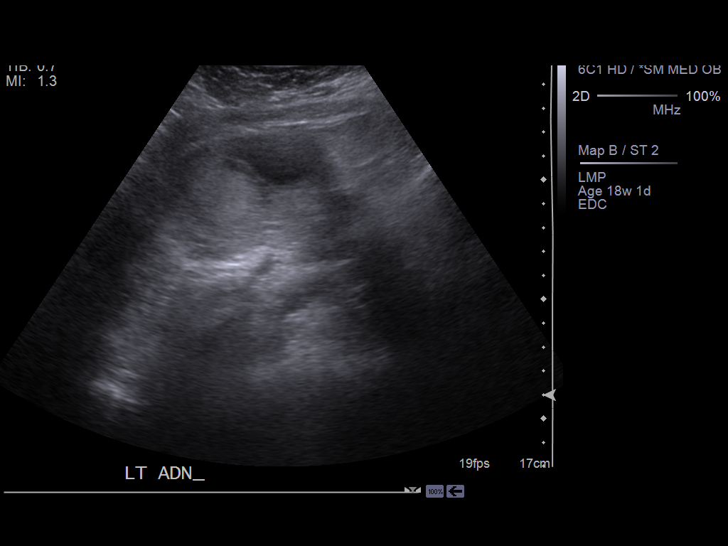
[im 24/24]
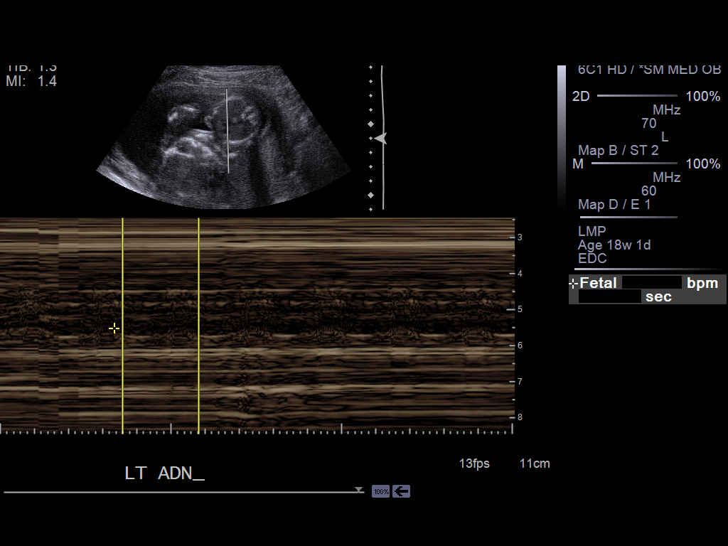

[14 of 24 positions shown; findings below may reference images not displayed]

PROCEDURE:     US  - US LIMITED OB  - March 23, 2012  [DATE]

RESULT:     Limited pelvic sonogram with limited OB protocol is performed
emergently demonstrating a single intrauterine gestational sac with a single
fetus showing a heart rate of 135 beats per minute. The placenta is
posterior in position with a grade 1 morphology with the inferior tip 3 cm
from the internal cervical os. The cervix is 4.6 cm in length and closed.
IMPRESSION: Single live intrauterine gestation as described above.

[REDACTED]

## 2014-01-02 ENCOUNTER — Emergency Department: Payer: Self-pay | Admitting: Emergency Medicine

## 2014-01-02 LAB — CBC WITH DIFFERENTIAL/PLATELET
Basophil #: 0.1 10*3/uL (ref 0.0–0.1)
Basophil %: 0.9 %
Eosinophil #: 0.1 10*3/uL (ref 0.0–0.7)
Eosinophil %: 1 %
HCT: 37 % (ref 35.0–47.0)
HGB: 11.6 g/dL — ABNORMAL LOW (ref 12.0–16.0)
Lymphocyte #: 3.8 10*3/uL — ABNORMAL HIGH (ref 1.0–3.6)
Lymphocyte %: 30.7 %
MCH: 25.1 pg — ABNORMAL LOW (ref 26.0–34.0)
MCHC: 31.3 g/dL — ABNORMAL LOW (ref 32.0–36.0)
MCV: 80 fL (ref 80–100)
Monocyte #: 0.7 x10 3/mm (ref 0.2–0.9)
Monocyte %: 5.8 %
Neutrophil #: 7.6 10*3/uL — ABNORMAL HIGH (ref 1.4–6.5)
Neutrophil %: 61.6 %
Platelet: 266 10*3/uL (ref 150–440)
RBC: 4.61 10*6/uL (ref 3.80–5.20)
RDW: 16.7 % — ABNORMAL HIGH (ref 11.5–14.5)
WBC: 12.3 10*3/uL — ABNORMAL HIGH (ref 3.6–11.0)

## 2014-01-02 LAB — COMPREHENSIVE METABOLIC PANEL
Albumin: 3.1 g/dL — ABNORMAL LOW (ref 3.4–5.0)
Alkaline Phosphatase: 108 U/L
Anion Gap: 6 — ABNORMAL LOW (ref 7–16)
BUN: 8 mg/dL (ref 7–18)
Bilirubin,Total: 0.2 mg/dL (ref 0.2–1.0)
Calcium, Total: 8.8 mg/dL (ref 8.5–10.1)
Chloride: 110 mmol/L — ABNORMAL HIGH (ref 98–107)
Co2: 26 mmol/L (ref 21–32)
Creatinine: 0.77 mg/dL (ref 0.60–1.30)
EGFR (African American): >60
EGFR (Non-African Amer.): >60
Glucose: 116 mg/dL — ABNORMAL HIGH (ref 65–99)
Osmolality: 282 (ref 275–301)
Potassium: 4 mmol/L (ref 3.5–5.1)
SGOT(AST): 20 U/L (ref 15–37)
SGPT (ALT): 24 U/L
Sodium: 142 mmol/L (ref 136–145)
Total Protein: 7.1 g/dL (ref 6.4–8.2)

## 2014-01-02 LAB — URINALYSIS, COMPLETE
Bilirubin,UR: NEGATIVE
Blood: NEGATIVE
Glucose,UR: NEGATIVE mg/dL (ref 0–75)
Ketone: NEGATIVE
Leukocyte Esterase: NEGATIVE
Nitrite: NEGATIVE
Ph: 7 (ref 4.5–8.0)
Protein: NEGATIVE
RBC,UR: <1 /HPF (ref 0–5)
Specific Gravity: 1.02 (ref 1.003–1.030)
Squamous Epithelial: 1
WBC UR: NONE SEEN /HPF (ref 0–5)

## 2014-01-03 LAB — GC/CHLAMYDIA PROBE AMP

## 2014-01-03 LAB — WET PREP, GENITAL

## 2014-01-04 IMAGING — US ABDOMEN ULTRASOUND
1 series · 13 of 25 positions shown · non-contrast
Comparison: none

REASON FOR EXAM: nausae vomiting RUQ pain  78wks pregnant
COMMENTS:

[Series 1: abdomen ultrasound · 0.31mm/px · 13 of 102 slices shown]
[im 1/102]
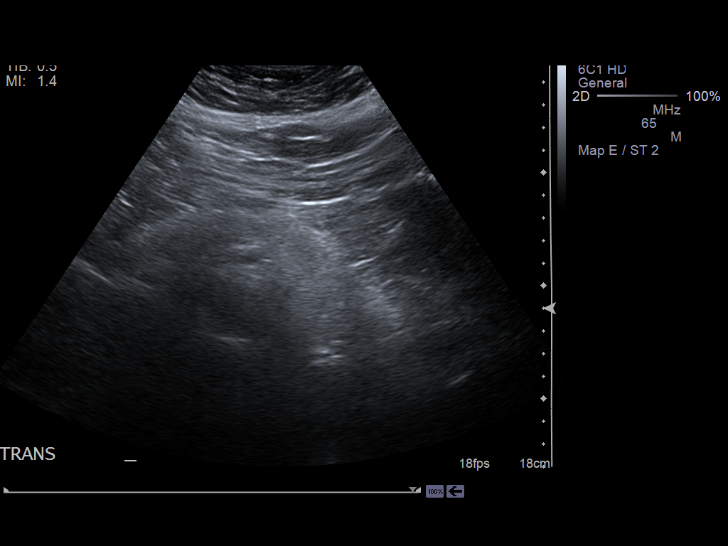
[im 9/102]
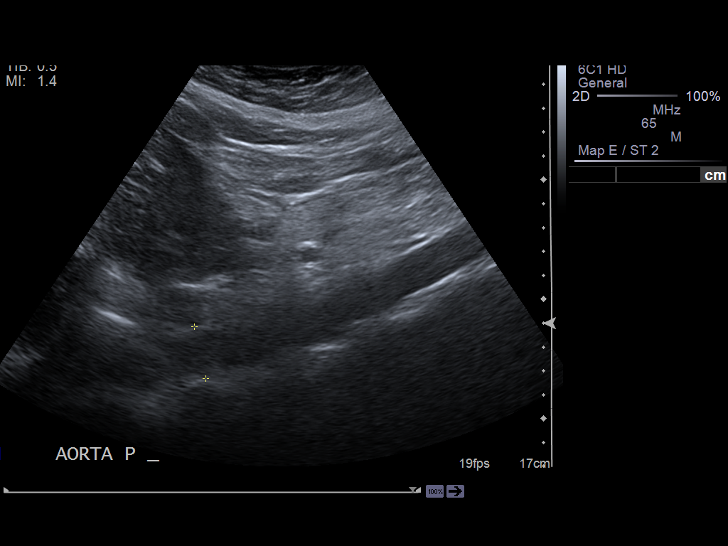
[im 17/102]
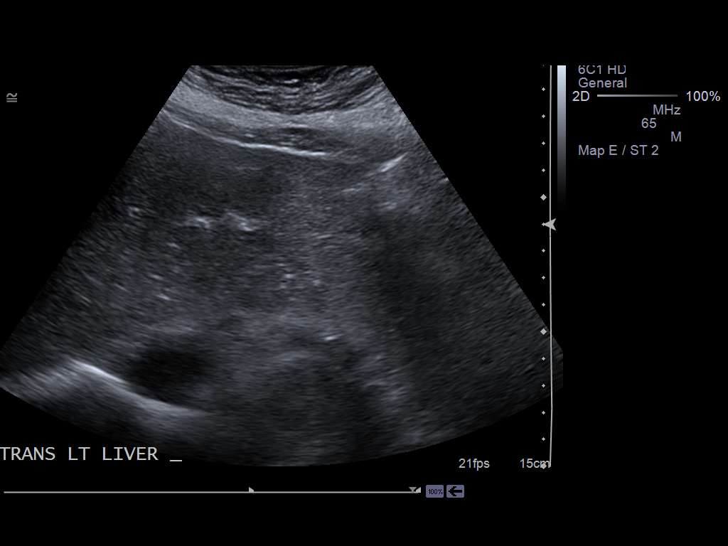
[im 26/102]
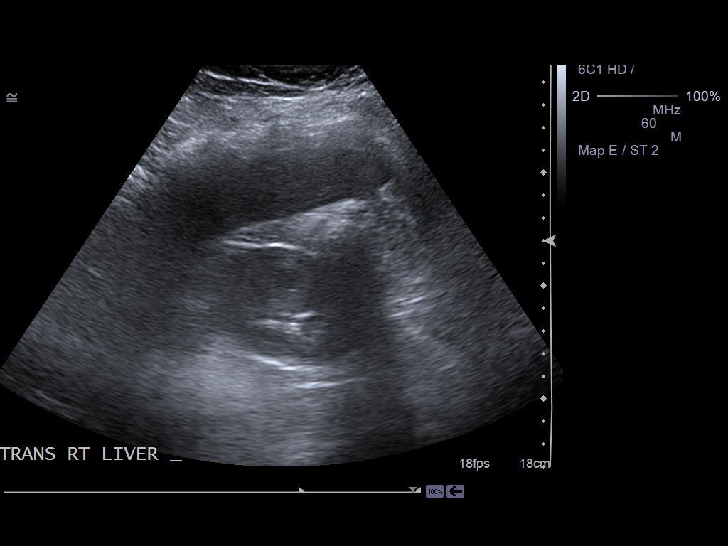
[im 34/102]
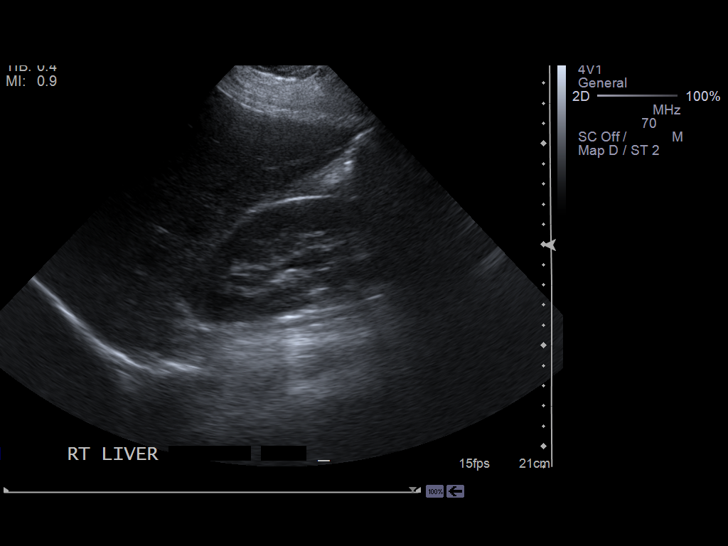
[im 43/102]
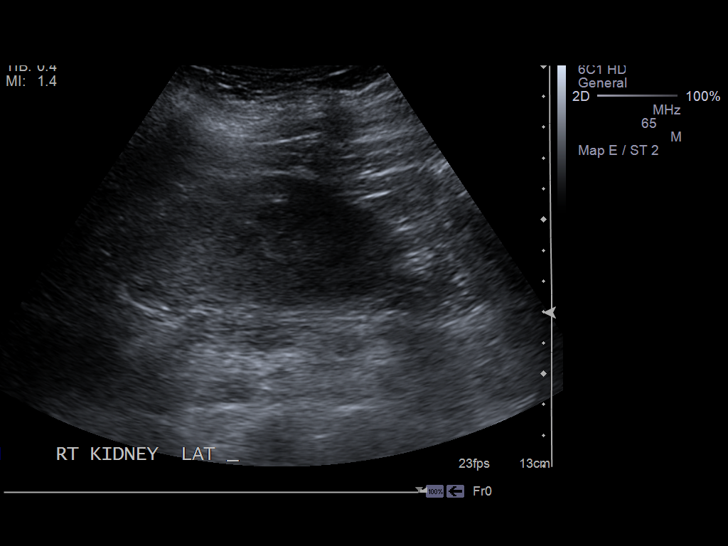
[im 51/102]
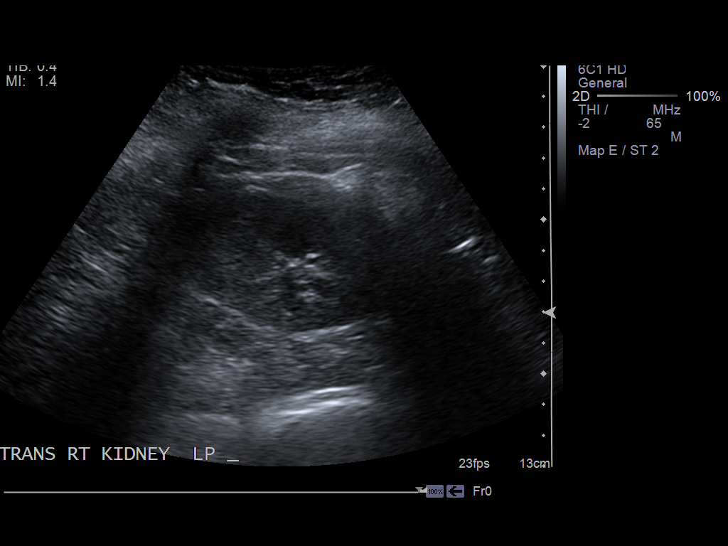
[im 59/102]
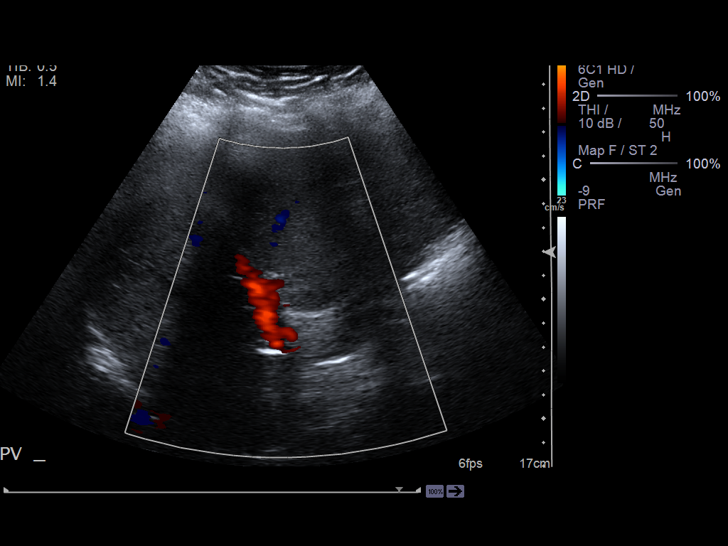
[im 68/102]
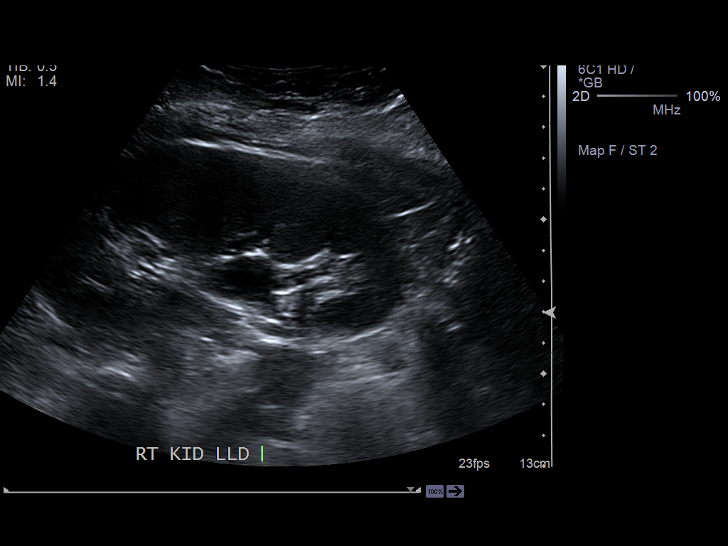
[im 76/102]
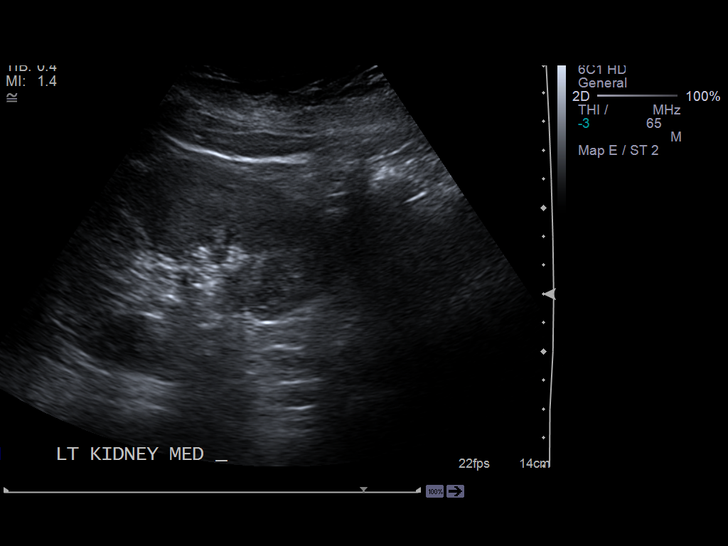
[im 85/102]
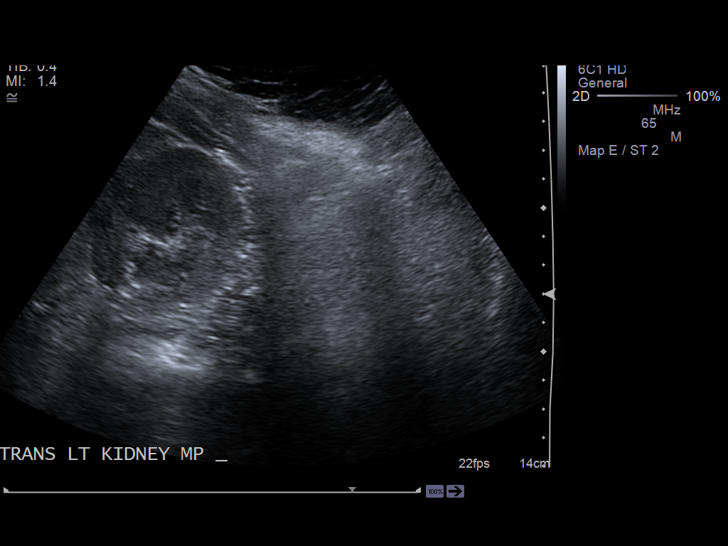
[im 93/102]
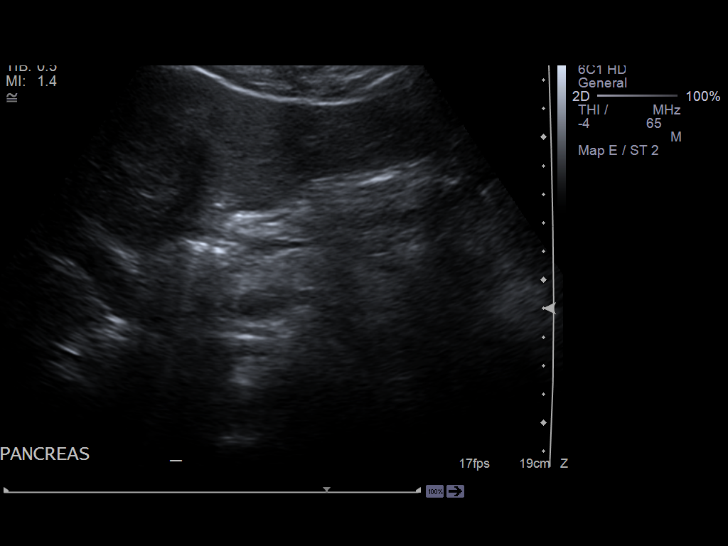
[im 102/102]
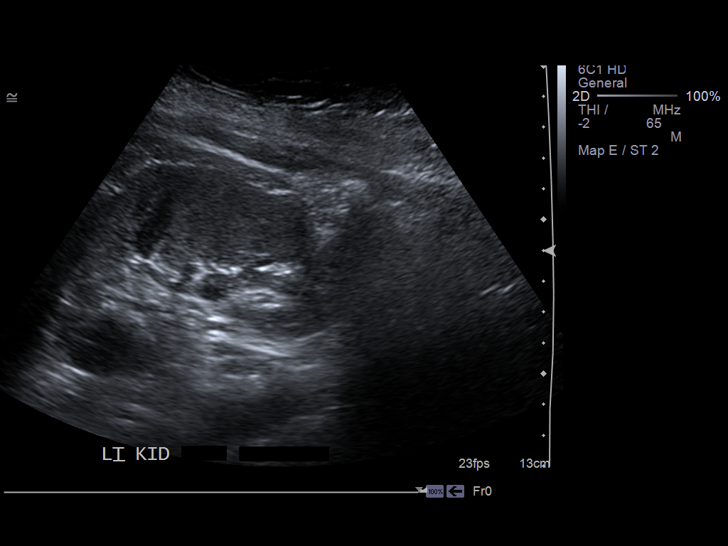

[13 of 25 positions shown; findings below may reference images not displayed]

PROCEDURE:     US  - US ABDOMEN GENERAL SURVEY  - April 13, 2012  [DATE]

RESULT:     The liver exhibits normal echotexture with no focal mass or
ductal dilation. Portal venous flow is normal in direction toward the liver.
Evaluation of the pancreas was limited due to the presence of bowel gas. The
gallbladder is adequately distended with no evidence of stones, wall
thickening, or pericholecystic fluid. The common bile duct is normal at
mm in diameter. There is no positive sonographic Murphy's sign. The spleen
is top normal in size but appropriate for the patient's body habitus.
Measures 12.1 cm in greatest dimension. The abdominal aorta and inferior
vena cava are normal in appearance.

The kidneys are normal in contour. There is mild splitting of the central
echo complex these bilaterally with mild fullness of the renal pelves which
may reflect very mild hydronephrosis. The right kidney measures 12.8 cm in
length and the left kidney 11.5 cm in length.
IMPRESSION: 1. There is no evidence of acute hepatobiliary abnormality. Evaluation the
pancreas was limited due to the patient's body habitus as well as bowel gas.
2. There is mild prominence of the intrarenal collecting systems bilaterally
which could reflect very mild hydronephrosis.
3. There is no evidence of ascites.

[REDACTED]

## 2014-01-07 ENCOUNTER — Ambulatory Visit: Payer: Self-pay | Admitting: Obstetrics and Gynecology

## 2014-01-07 LAB — HEMOGLOBIN: HGB: 12 g/dL (ref 12.0–16.0)

## 2014-01-11 ENCOUNTER — Ambulatory Visit: Payer: Self-pay | Admitting: Obstetrics and Gynecology

## 2014-01-15 LAB — PATHOLOGY REPORT

## 2014-03-03 ENCOUNTER — Emergency Department: Payer: Self-pay | Admitting: Student

## 2014-03-08 ENCOUNTER — Ambulatory Visit: Payer: Self-pay | Admitting: Physician Assistant

## 2014-03-08 LAB — RAPID STREP-A WITH REFLX: Micro Text Report: POSITIVE

## 2014-07-02 LAB — HM PAP SMEAR: HM PAP: NEGATIVE

## 2014-08-09 DIAGNOSIS — M5442 Lumbago with sciatica, left side: Secondary | ICD-10-CM | POA: Insufficient documentation

## 2014-08-21 ENCOUNTER — Emergency Department: Payer: Self-pay | Admitting: Emergency Medicine

## 2014-09-24 NOTE — Consult Note (Signed)
Referral Information:   Reason for Referral 24yo G3P0020 at [redacted]w[redacted]d by LMP of 11/18/2011 with significant history of depression/anxiety and elevated BMI presents for pregnancy recommendations.    Referring Physician [facility]    Prenatal Hx Was on multiple medications including Adderall, Amitiza, Klonopin, Lysteda, Nexium, Trazodone, Zofran, and Zoloft.  Currently only taking Trazodone and Zoloft.  Takes the Klonopin PRN as well as albuterol and the advair discus PRN for asthma.  Sees a psychiatrist for medication management and a therapist on a regular basis.    Past Obstetrical Hx History of SAB x 2, both about 4 weeks (patient states she had a positive pregnancy test) at age 65 and 53.   Home Medications: Medication Instructions Status  Zoloft 150 milligram(s) orally once a day Active  Nexium 40 mg oral delayed release capsule 1 cap(s) orally once a day Active  Prenatal Multivitamins oral tablet 1 tab(s) orally once a day Active  trazodone 100 mg oral tablet tab(s) orally once a day, As Needed Active   Allergies:   Amoxicillin: Rash  Augmentin ES-600: Rash  Cefzil: Rash  Vital Signs/Notes:  Nursing Vital Signs: **Vital Signs.:   08-Aug-13 09:28   Vital Signs Type Routine   Temperature Temperature (F) 98.2   Celsius 36.7   Temperature Source oral   Pulse Pulse 45   Pulse source if not from Vital Sign Device dinamap   Respirations Respirations 16   Systolic BP Systolic BP 94   Diastolic BP (mmHg) Diastolic BP (mmHg) 49   Mean BP 64   BP Source  if not from Vital Sign Device dinamap, rt arm large cuff, pt sitting (pt did not eat today)    10:51   Vital Signs Type Recheck   Pulse Pulse 47   Pulse source if not from Vital Sign Device dinamap   Respirations Respirations 14   Systolic BP Systolic BP 103   Diastolic BP (mmHg) Diastolic BP (mmHg) 57   Mean BP 72   BP Source  if not from Vital Sign Device dinamap left arm large cuff pt sitting   Perinatal Consult:   PGyn  Hx Hx of abnormal PAPs - no intervention.  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  Depression/anxiety with medication exposure 2.  Elevated BMI  3.  MRSA  4.  ? Menorrhagia - this was not disclosed.  Note that recent Hct is 39.    Recommendations 1.  Depression/anxiety - had genetic counseling to review medication exposure.  See consult under separate cover.  Continue current meds as well as treatment with therapist and psychiatrist. 2.  Elevated BMI - encouraged additional 1mg  folic acid.  Recommend baseline p/c ratio, EKG, TFTs and screening for sleep apnea.  Patient reportedly does snore and has daytime somnulence.  Also recommend early glucola, limiting weight gain to <15#,  detailed ultrasound at about 18 weeks, montly growth US after 28 weeks and weekly NSTs after 36 weeks with delivery no later than 41 weeks. 3.  MRSA - could consider nasal swabs to determine if patient is still colonized and decolonization treatment if appropriate 4  ? Menorrhagia - if bleeding history is significant, consider workup for Von Willebrand's.   Plan:   Genetic Counseling yes    Prenatal Diagnosis Options Level II US    Ultrasound at what gestational ages Monthly > 28 weeks    Additional Testing Thyroid panel, Folate/prenatal vitamins     Total Time Spent with Patient 15 minutes    >50% of visit spent in couseling/coordination of care yes    Office Use Only [address]  Level 1 (15min) NEW office consult prob focused   Coding Description: MATERNAL CONDITIONS/HISTORY INDICATION(S).   Obesity - BMI greater than equal to 30.   OTHER: Depression/anxiety.  Electronic Signatures: Kirby FunkEllestad, Griffin Dewilde (MD)  (Signed 08-Aug-13 12:13)  Authored: Referral, Home Medications, Allergies, Vital Signs/Notes, Consult, Exam, Impression, Plan, Billing, Coding Description   Last Updated: 08-Aug-13 12:13 by Kirby FunkEllestad, Shalise Rosado (MD)

## 2014-09-28 NOTE — Op Note (Signed)
PATIENT NAME:  Yvonne Rios, Yvonne Rios MR#:  [id number] DATE OF BIRTH:  1991/03/01  DATE OF PROCEDURE:  01/11/2014  PREOPERATIVE DIAGNOSIS: Abnormal uterine bleeding.   POSTOPERATIVE DIAGNOSIS: Abnormal uterine bleeding.   OPERATION PERFORMED: Hysteroscopy, dilatation and curettage, removal of Mirena intrauterine device.  ANESTHESIA USED: General.  PRIMARY SURGEON: Florina OuAndreas M. Bonney AidStaebler, MD  ESTIMATED BLOOD LOSS: Minimal.  OPERATIVE FLUIDS: 800 mL  PREOPERATIVE ANTIBIOTICS: None.   COMPLICATIONS: None.   INTRAOPERATIVE FINDINGS: The hysteroscopy revealed normal intracavitary findings with normal tubal ostia, normal endometrial cavity, and IUD strings just past the cervix, which were easily removed using ring forceps.  PATIENT CONDITION FOLLOWING THE PROCEDURE: Stable.   PROCEDURE IN DETAIL: Risks, benefits, and alternatives of the procedure were discussed with the patient prior to proceeding to the operating room. The patient was taken to the operating room where she was placed under general endotracheal anesthesia via an LMA airway. She was prepped and draped in the usual sterile fashion after being positioned in the dorsal lithotomy position using Allen stirrups. Prior to the beginning of the case, a timeout was performed. The patient's bladder was straight catheterized with a red rubber catheter. Attention was turned to the patient's pelvis. An operative speculum was placed. The anterior lip of the cervix was visualized, grasped with a single-tooth tenaculum. The IUD strings were visualized just past the cervix, grasped with ring forceps, and removed without difficulty. Following removal of the IUD, the cervix was sequentially dilated using Pratt dilators. A hysteroscope was then advanced into the endometrial cavity, noting normal cavity contour with normal tubal ostia bilaterally, normal fundus without evidence of polyps or other structural lesions. The hysteroscope was then removed, sharp  curettage was performed. The single-tooth tenaculum was removed. The cervical os and tenaculum sites were noted to be hemostatic. Sponge, needle, and instrument counts were correct x 2. The patient tolerated the procedure well and was taken to the recovery room in stable condition.    ____________________________ Florina OuAndreas M. Bonney AidStaebler, MD ams:sk D: 01/14/2014 17:37:22 ET T: 01/14/2014 23:58:55 ET JOB#: [id number]  cc: Florina OuAndreas M. Bonney AidStaebler, MD, <Dictator> Carmel SacramentoANDREAS Cathrine MusterM Kala Gassmann MD ELECTRONICALLY SIGNED 01/17/2014 11:41

## 2014-10-15 DIAGNOSIS — J4520 Mild intermittent asthma, uncomplicated: Secondary | ICD-10-CM | POA: Insufficient documentation

## 2014-10-15 NOTE — H&P (Signed)
L&D Evaluation:  History:   HPI 24 year old G3P0 at 24 weeks to L&D with c/o sharp side pains that have been going on for a few days. [facility], obesityh (BMI >40), and anxiety and depression. Other complaints that pt has is back pain off and on and constipation. Pt describes pain on sides, that radiates to her back bilaterally.  She has also been constipated more recently, she normally has BM at least every 2 days and hasn't gone in a few days.    Presents with abdominal pain    Patient's Medical History anxiety/depression, obesity    Patient's Surgical History none    Medications Pre Natal Vitamins  folic acid, zoloft, klonopin, trazadone, miralax    Allergies amoxicillin, augmentin, cefzil    Social History none    Family History Non-Contributory   ROS:   ROS see HPI   Exam:   Vital Signs stable    Urine Protein not completed    General no apparent distress    Mental Status clear    Chest nl effort    Abdomen gravid, non-tender    Back no CVAT    Edema no edema    Pelvic no external lesions, cervix closed and thick    FHT normal rate with no decels    Ucx absent   Impression:   Impression normal discomforts of preganancy   Plan:   Plan EFM/NST, discharge    Comments reassurrance given to pt and educated about normal discomforts of pregnancy Pt also encouraged to start stool softener OTC Has appt in 4 days    Follow Up Appointment already scheduled   Electronic Signatures: Shella Maximutnam, Aliza Moret (CNM)  (Signed [phone]-Nov-13 23:40)  Authored: L&D Evaluation   Last Updated: 28-Nov-13 23:40 by Shella MaximPutnam, Kaniyah Lisby (CNM)

## 2014-10-15 NOTE — H&P (Signed)
L&D Evaluation:  History:   HPI 24 year old G3P0 at 2027 4 weeks to L&D with complaints of cramping.  She started cramping this morning, then had intercourse, and noted some light post coital spotting, no further spotting but some more cramping this evening.  She reports +FM, no LOF.   [facility], obesityh (BMI >40), and anxiety and depression.    Presents with abdominal pain, vaginal bleeding    Patient's Medical History anxiety/depression, obesity    Patient's Surgical History none    Medications Pre Natal Vitamins  folic acid, zoloft, klonopin, trazadone, miralax    Allergies amoxicillin, augmentin, cefzil    Social History none    Family History Non-Contributory   ROS:   ROS All systems were reviewed.  HEENT, CNS, GI, GU, Respiratory, CV, Renal and Musculoskeletal systems were found to be normal., unless otherwise noted in HPI   Exam:   Vital Signs stable    Urine Protein not completed    General no apparent distress    Mental Status clear    Chest nl effort    Abdomen gravid, non-tender    Back no CVAT    Edema no edema    Pelvic no external lesions, cervix closed and thick, no bleeding noted on speculum exam    FHT normal rate with no decels    Ucx absent   Impression:   Impression normal discomforts of preganancy and postcoitalspotting   Plan:   Plan EFM/NST, discharge    Comments - reassurance has follow up on 06/08/2012 - UA negative for UTI    Follow Up Appointment already scheduled   Electronic Signatures: Lorrene ReidStaebler, Courteney Alderete M (MD)  (Signed 24-Dec-13 20:15)  Authored: L&D Evaluation   Last Updated: 24-Dec-13 20:15 by Lorrene ReidStaebler, Jleigh Striplin M (MD)
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
## 2014-10-15 NOTE — H&P (Signed)
L&D Evaluation:  History Expanded:  HPI 24 yo G3P0020 whose EDC = 08/24/12.  [facility].  Presents with contractions.   Blood Type (Maternal) O positive   Maternal Varicella Immune   Rubella Results (Maternal) immune   EDC 24-Aug-2012   Presents with contractions   Patient's Medical History Obesity, asthma, anxiety, depression, Pt has 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   Patient's Surgical History Pyloric stenosis   Medications Pre [place]atal Vitamins  Iron  Zoloft, nexium, klonapin, trazone, zantac   Allergies amoxicillin and augmentin - rash   Current Prenatal Course Notable For obesity   Exam:  Vital Signs stable   General no apparent distress   Mental Status clear   Chest clear   Heart normal sinus rhythm   Abdomen gravid, non-tender   Estimated Fetal Weight Average for gestational age   Pelvic cervix closed and thick   FHT normal rate with no decels, excellent NST   Ucx absent   Ucx Pain Scale 0   Impression:  Impression False labor   Plan:  Comments will allow to go home   Electronic Signatures: Towana Badgerosenow, Philip J (MD)  (Signed 17-Feb-14 13:16)  Authored: L&D Evaluation   Last Updated: 17-Feb-14 13:16 by Towana Badgerosenow, Philip J (MD)
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
## 2014-11-21 DIAGNOSIS — J45909 Unspecified asthma, uncomplicated: Secondary | ICD-10-CM | POA: Insufficient documentation

## 2014-11-29 ENCOUNTER — Encounter: Payer: Self-pay | Admitting: *Deleted

## 2014-11-29 ENCOUNTER — Emergency Department
Admission: EM | Admit: 2014-11-29 | Discharge: 2014-11-29 | Disposition: A | Discharge status: Home / Self Care | Payer: Medicaid Other | Attending: Emergency Medicine | Admitting: Emergency Medicine

## 2014-11-29 ENCOUNTER — Emergency Department: Payer: Medicaid Other

## 2014-11-29 DIAGNOSIS — R05 Cough: Secondary | ICD-10-CM | POA: Diagnosis present

## 2014-11-29 DIAGNOSIS — H1013 Acute atopic conjunctivitis, bilateral: Secondary | ICD-10-CM | POA: Diagnosis not present

## 2014-11-29 DIAGNOSIS — J209 Acute bronchitis, unspecified: Secondary | ICD-10-CM

## 2014-11-29 DIAGNOSIS — J45901 Unspecified asthma with (acute) exacerbation: Secondary | ICD-10-CM | POA: Insufficient documentation

## 2014-11-29 DIAGNOSIS — Z880 Allergy status to penicillin: Secondary | ICD-10-CM | POA: Diagnosis not present

## 2014-11-29 DIAGNOSIS — R059 Cough, unspecified: Secondary | ICD-10-CM

## 2014-11-29 MED ORDER — AZITHROMYCIN 250 MG PO TABS
500.0000 mg | ORAL_TABLET | Freq: Once | ORAL | Status: AC
  Administered 2014-11-29: 500 mg via ORAL

## 2014-11-29 MED ORDER — CETIRIZINE HCL 5 MG/5ML PO SYRP
5.0000 mg | ORAL_SOLUTION | Freq: Once | ORAL | Status: DC
  Filled 2014-11-29: qty 5

## 2014-11-29 MED ORDER — AZITHROMYCIN 250 MG PO TABS: 500.0000 mg | ORAL_TABLET | Freq: Every day | ORAL | Status: AC

## 2014-11-29 MED ORDER — CETIRIZINE HCL 10 MG PO TABS: 10.0000 mg | ORAL_TABLET | Freq: Every day | ORAL | Status: DC

## 2014-11-29 MED ORDER — AZITHROMYCIN 250 MG PO TABS
ORAL_TABLET | ORAL | Status: AC
  Filled 2014-11-29: qty 2

## 2014-11-29 MED ORDER — CETIRIZINE HCL 10 MG PO TABS
ORAL_TABLET | ORAL | Status: AC
  Administered 2014-11-29: 10 mg
  Filled 2014-11-29: qty 1

## 2014-11-29 MED ORDER — NON FORMULARY: 10.0000 mg | Freq: Once | Status: DC

## 2014-11-29 MED ORDER — IPRATROPIUM-ALBUTEROL 0.5-2.5 (3) MG/3ML IN SOLN: 3.0000 mL | Freq: Once | RESPIRATORY_TRACT | Status: DC

## 2014-11-29 NOTE — ED Notes (Signed)
Onset of symptoms three weeks to a month ago with cough, productive occasionally, red itchy eye and watery eyes and nose. Patient denies smoking or second hand smoke exposure.

## 2014-11-29 NOTE — Discharge Instructions (Signed)
Acute Bronchitis Bronchitis is inflammation of the airways that extend from the windpipe into the lungs (bronchi). The inflammation often causes mucus to develop. This leads to a cough, which is the most common symptom of bronchitis.  In acute bronchitis, the condition usually develops suddenly and goes away over time, usually in a couple weeks. Smoking, allergies, and asthma can make bronchitis worse. Repeated episodes of bronchitis may cause further lung problems.  CAUSES Acute bronchitis is most often caused by the same virus that causes a cold. The virus can spread from person to person (contagious) through coughing, sneezing, and touching contaminated objects. SIGNS AND SYMPTOMS   Cough.   Fever.   Coughing up mucus.   Body aches.   Chest congestion.   Chills.   Shortness of breath.   Sore throat.  DIAGNOSIS  Acute bronchitis is usually diagnosed through a physical exam. Your health care provider will also ask you questions about your medical history. Tests, such as chest X-rays, are sometimes done to rule out other conditions.  TREATMENT  Acute bronchitis usually goes away in a couple weeks. Oftentimes, no medical treatment is necessary. Medicines are sometimes given for relief of fever or cough. Antibiotic medicines are usually not needed but may be prescribed in certain situations. In some cases, an inhaler may be recommended to help reduce shortness of breath and control the cough. A cool mist vaporizer may also be used to help thin bronchial secretions and make it easier to clear the chest.  HOME CARE INSTRUCTIONS  Get plenty of rest.   Drink enough fluids to keep your urine clear or pale yellow (unless you have a medical condition that requires fluid restriction). Increasing fluids may help thin your respiratory secretions (sputum) and reduce chest congestion, and it will prevent dehydration.   Take medicines only as directed by your health care provider.  If  you were prescribed an antibiotic medicine, finish it all even if you start to feel better.  Avoid smoking and secondhand smoke. Exposure to cigarette smoke or irritating chemicals will make bronchitis worse. If you are a smoker, consider using nicotine gum or skin patches to help control withdrawal symptoms. Quitting smoking will help your lungs heal faster.   Reduce the chances of another bout of acute bronchitis by washing your hands frequently, avoiding people with cold symptoms, and trying not to touch your hands to your mouth, nose, or eyes.   Keep all follow-up visits as directed by your health care provider.  SEEK MEDICAL CARE IF: Your symptoms do not improve after 1 week of treatment.  SEEK IMMEDIATE MEDICAL CARE IF:  You develop an increased fever or chills.   You have chest pain.   You have severe shortness of breath.  You have bloody sputum.   You develop dehydration.  You faint or repeatedly feel like you are going to pass out.  You develop repeated vomiting.  You develop a severe headache. MAKE SURE YOU:   Understand these instructions.  Will watch your condition.  Will get help right away if you are not doing well or get worse. Document Released: 07/01/2004 Document Revised: 10/08/2013 Document Reviewed: 11/14/2012 [facility] Patient Information 2015 [place], [place]. This information is not intended to replace advice given to you by your health care provider. Make sure you discuss any questions you have with your health care provider.  Allergic Conjunctivitis The conjunctiva is a thin membrane that covers the visible white part of the eyeball and the underside of the eyelids.  This membrane protects and lubricates the eye. The membrane has small blood vessels running through it that can normally be seen. When the conjunctiva becomes inflamed, the condition is called conjunctivitis. In response to the inflammation, the conjunctival blood vessels become swollen.  The swelling results in redness in the normally white part of the eye. The blood vessels of this membrane also react when a person has allergies and is then called allergic conjunctivitis. This condition usually lasts for as long as the allergy persists. Allergic conjunctivitis cannot be passed to another person (non-contagious). The likelihood of bacterial infection is great and the cause is not likely due to allergies if the inflamed eye has:  A sticky discharge.  Discharge or sticking together of the lids in the morning.  Scaling or flaking of the eyelids where the eyelashes come out.  Red swollen eyelids. CAUSES   Viruses.  Irritants such as foreign bodies.  Chemicals.  General allergic reactions.  Inflammation or serious diseases in the inside or the outside of the eye or the orbit (the boney cavity in which the eye sits) can cause a "red eye." SYMPTOMS   Eye redness.  Tearing.  Itchy eyes.  Burning feeling in the eyes.  Clear drainage from the eye.  Allergic reaction due to pollens or ragweed sensitivity. Seasonal allergic conjunctivitis is frequent in the spring when pollens are in the air and in the fall. DIAGNOSIS  This condition, in its many forms, is usually diagnosed based on the history and an ophthalmological exam. It usually involves both eyes. If your eyes react at the same time every year, allergies may be the cause. While most "red eyes" are due to allergy or an infection, the role of an eye (ophthalmological) exam is important. The exam can rule out serious diseases of the eye or orbit. TREATMENT   Non-antibiotic eye drops, ointments, or medications by mouth may be prescribed if the ophthalmologist is sure the conjunctivitis is due to allergies alone.  Over-the-counter drops and ointments for allergic symptoms should be used only after other causes of conjunctivitis have been ruled out, or as your caregiver suggests. Medications by mouth are often  prescribed if other allergy-related symptoms are present. If the ophthalmologist is sure that the conjunctivitis is due to allergies alone, treatment is normally limited to drops or ointments to reduce itching and burning. HOME CARE INSTRUCTIONS   Wash hands before and after applying drops or ointments, or touching the inflamed eye(s) or eyelids.  Do not let the eye dropper tip or ointment tube touch the eyelid when putting medicine in your eye.  Stop using your soft contact lenses and throw them away. Use a new pair of lenses when recovery is complete. You should run through sterilizing cycles at least three times before use after complete recovery if the old soft contact lenses are to be used. Hard contact lenses should be stopped. They need to be thoroughly sterilized before use after recovery.  Itching and burning eyes due to allergies is often relieved by using a cool cloth applied to closed eye(s). SEEK MEDICAL CARE IF:   Your problems do not go away after two or three days of treatment.  Your lids are sticky (especially in the morning when you wake up) or stick together.  Discharge develops. Antibiotics may be needed either as drops, ointment, or by mouth.  You have extreme light sensitivity.  An oral temperature above 102 F (38.9 C) develops.  Pain in or around the eye or  any other visual symptom develops. MAKE SURE YOU:   Understand these instructions.  Will watch your condition.  Will get help right away if you are not doing well or get worse. Document Released: 08/14/2002 Document Revised: 08/16/2011 Document Reviewed: 07/10/2007 [facility] Patient Information 2015 [place], [place]. This information is not intended to replace advice given to you by your health care provider. Make sure you discuss any questions you have with your health care provider.

## 2014-11-29 NOTE — ED Provider Notes (Signed)
[facility] Emergency Department Provider Note  ____________________________________________  Time seen: 2:30 AM  I have reviewed the triage vital signs and the nursing notes.   HISTORY  Chief Complaint Cough      HPI Yvonne Rios is a 24 y.o. female presents with one-month history of cough bilateral eye "itchy and watering". Patient also admits to intermittent wheezing has a history of asthma.     Past medical history Asthma  There are no active problems to display for this patient.   No past surgical history on file.  No current outpatient prescriptions on file.  Allergies Amoxil and Augmentin  No family history on file.  Social History History  Substance Use Topics  . Smoking status: Never Smoker   . Smokeless tobacco: Not on file  . Alcohol Use: Yes    Review of Systems  Constitutional: Negative for fever. Eyes: Negative for visual changes. ENT: Negative for sore throat. Cardiovascular: Negative for chest pain. Respiratory: Negative for shortness of breath. Positive for wheezing and cough Gastrointestinal: Negative for abdominal pain, vomiting and diarrhea. Genitourinary: Negative for dysuria. Musculoskeletal: Negative for back pain. Skin: Negative for rash. Neurological: Negative for headaches, focal weakness or numbness.   10-point ROS otherwise negative.  ____________________________________________   PHYSICAL EXAM:  VITAL SIGNS: ED Triage Vitals  Enc Vitals Group     BP 11/29/14 0035 128/65 mmHg     Pulse Rate 11/29/14 0035 79     Resp 11/29/14 0035 20     Temp 11/29/14 0035 98.6 F (37 C)     Temp src --      SpO2 11/29/14 0035 97 %     Weight 11/29/14 0035 330 lb (149.687 kg)     Height 11/29/14 0035 5\' 8"  (1.727 m)     Head Cir --      Peak Flow --      Pain Score 11/29/14 0036 8     Pain Loc --      Pain Edu? --      Excl. in GC? --      Constitutional: Alert and oriented. Well  appearing and in no distress. Eyes: Conjunctivae are normal. PERRL. Normal extraocular movements. ENT   Head: Normocephalic and atraumatic.   Nose: No congestion/rhinnorhea.   Mouth/Throat: Mucous membranes are moist.   Neck: No stridor. Hematological/Lymphatic/Immunilogical: No cervical lymphadenopathy. Cardiovascular: Normal rate, regular rhythm. Normal and symmetric distal pulses are present in all extremities. No murmurs, rubs, or gallops. Respiratory: Normal respiratory effort without tachypnea nor retractions. Breath sounds are clear and equal bilaterally. Mild expiratory wheeze noted Gastrointestinal: Soft and nontender. No distention. There is no CVA tenderness. Genitourinary: deferred Musculoskeletal: Nontender with normal range of motion in all extremities. No joint effusions.  No lower extremity tenderness nor edema. Neurologic:  Normal speech and language. No gross focal neurologic deficits are appreciated. Speech is normal.  Skin:  Skin is warm, dry and intact. No rash noted. Psychiatric: Mood and affect are normal. Speech and behavior are normal. Patient exhibits appropriate insight and judgment.  ____________________________________________     RADIOLOGY  Chest x-ray revealed no acute cardiopulmonary process per radiologist  ____________________________________________     INITIAL IMPRESSION / ASSESSMENT AND PLAN / ED COURSE  Pertinent labs & imaging results that were available during my care of the patient were reviewed by me and considered in my medical decision making (see chart for details).  History physical exam consistent with possible allergic bronchitis and conjunctivitis as such patient  will be prescribed Zyrtec  ____________________________________________   FINAL CLINICAL IMPRESSION(S) / ED DIAGNOSES  Final diagnoses:  Acute bronchitis, unspecified organism  Allergic conjunctivitis, bilateral      Darci Current, MD 11/29/14  [phone]

## 2014-11-29 NOTE — ED Notes (Signed)
Pt has a productive cough for 2-3 weeks.  States coughing up yellow phelgm.  Pt also has itchy watery eyes.

## 2014-12-23 ENCOUNTER — Ambulatory Visit
Admission: EM | Admit: 2014-12-23 | Discharge: 2014-12-23 | Disposition: A | Discharge status: Home / Self Care | Payer: Medicaid Other | Attending: Family Medicine | Admitting: Family Medicine

## 2014-12-23 DIAGNOSIS — H6502 Acute serous otitis media, left ear: Secondary | ICD-10-CM

## 2014-12-23 DIAGNOSIS — J0100 Acute maxillary sinusitis, unspecified: Secondary | ICD-10-CM

## 2014-12-23 HISTORY — DX: Unspecified asthma, uncomplicated: J45.909

## 2014-12-23 HISTORY — DX: Carrier or suspected carrier of methicillin resistant Staphylococcus aureus: Z22.322

## 2014-12-23 MED ORDER — SULFAMETHOXAZOLE-TRIMETHOPRIM 800-160 MG PO TABS: 1.0000 | ORAL_TABLET | Freq: Two times a day (BID) | ORAL | Status: DC

## 2014-12-23 NOTE — ED Notes (Signed)
Patient complains of nasal congestion, runny nose, headaches, watery eyes and itchy eyes x 1 month. Patient states that symptoms have been constant without relief from OTC treatment.

## 2014-12-23 NOTE — ED Provider Notes (Signed)
CSN: [id number]     Arrival date & time 12/23/14  1720 History   None    Chief Complaint  Patient presents with  . Nasal Congestion   (Consider location/radiation/quality/duration/timing/severity/associated sxs/prior Treatment) Patient is a 24 y.o. female presenting with URI. The history is provided by the patient.  URI Presenting symptoms: congestion, ear pain, facial pain and fatigue   Severity:  Moderate Onset quality:  Sudden Duration:  1 month Timing:  Constant Progression:  Worsening Chronicity:  New Associated symptoms: headaches and sinus pain   Associated symptoms: no neck pain and no wheezing     Past Medical History  Diagnosis Date  . MRSA (methicillin resistant staph aureus) culture positive   . Asthma    No past surgical history on file. No family history on file. History  Substance Use Topics  . Smoking status: Never Smoker   . Smokeless tobacco: Not on file  . Alcohol Use: 0.0 oz/week    0 Standard drinks or equivalent per week     Comment: rarely   OB History    No data available     Review of Systems  Constitutional: Positive for fatigue.  HENT: Positive for congestion and ear pain.   Respiratory: Negative for wheezing.   Musculoskeletal: Negative for neck pain.  Neurological: Positive for headaches.    Allergies  Amoxil and Augmentin  Home Medications   Prior to Admission medications   Medication Sig Start Date End Date Taking? Authorizing Provider  cetirizine (ZYRTEC) 10 MG tablet Take 1 tablet (10 mg total) by mouth daily. 11/29/14  Yes Darci Currentandolph N Brown, MD  sulfamethoxazole-trimethoprim (BACTRIM DS,SEPTRA DS) 800-160 MG per tablet Take 1 tablet by mouth 2 (two) times daily. 12/23/14   Payton Mccallumrlando Amogh Komatsu, MD   BP 112/67 mmHg  Pulse 60  Temp(Src) 98.4 F (36.9 C) (Oral)  Resp 18  Ht 5\' 8"  (1.727 m)  Wt 330 lb (149.687 kg)  BMI 50.19 kg/m2  SpO2 98%  LMP 12/06/2014 (Approximate) Physical Exam  Constitutional: She appears well-developed  and well-nourished. No distress.  HENT:  Head: Normocephalic and atraumatic.  Right Ear: Tympanic membrane, external ear and ear canal normal.  Left Ear: Tympanic membrane, external ear and ear canal normal.  Nose: Mucosal edema and rhinorrhea present. No nose lacerations, sinus tenderness, nasal deformity, septal deviation or nasal septal hematoma. No epistaxis.  No foreign bodies. Right sinus exhibits maxillary sinus tenderness and frontal sinus tenderness. Left sinus exhibits maxillary sinus tenderness and frontal sinus tenderness.  Mouth/Throat: Uvula is midline, oropharynx is clear and moist and mucous membranes are normal. No oropharyngeal exudate or posterior oropharyngeal erythema.  Eyes: Conjunctivae and EOM are normal. Pupils are equal, round, and reactive to light. Right eye exhibits no discharge. Left eye exhibits no discharge. No scleral icterus.  Neck: Normal range of motion. Neck supple. No thyromegaly present.  Cardiovascular: Normal rate, regular rhythm and normal heart sounds.   Pulmonary/Chest: Effort normal and breath sounds normal. No respiratory distress. She has no wheezes. She has no rales.  Lymphadenopathy:    She has no cervical adenopathy.  Skin: She is not diaphoretic.  Nursing note and vitals reviewed.   ED Course  Procedures (including critical care time) Labs Review Labs Reviewed - No data to display  Imaging Review No results found.   MDM   1. Acute maxillary sinusitis, recurrence not specified   2. Acute serous otitis media of left ear, recurrence not specified    Discharge Medication List as of  12/23/2014  7:22 PM    START taking these medications   Details  sulfamethoxazole-trimethoprim (BACTRIM DS,SEPTRA DS) 800-160 MG per tablet Take 1 tablet by mouth 2 (two) times daily., Starting 12/23/2014, Until Discontinued, Normal       Plan: 1. diagnosis reviewed with patient 2. rx as per orders; risks, benefits, potential side effects reviewed with  patient 3. Recommend supportive treatment with otc steroid nose spray 4. F/u prn if symptoms worsen or don't improve   Payton Mccallum, MD 12/23/14 1935

## 2015-01-11 DIAGNOSIS — N898 Other specified noninflammatory disorders of vagina: Secondary | ICD-10-CM | POA: Diagnosis not present

## 2015-01-11 DIAGNOSIS — Z3202 Encounter for pregnancy test, result negative: Secondary | ICD-10-CM | POA: Insufficient documentation

## 2015-01-11 DIAGNOSIS — N644 Mastodynia: Secondary | ICD-10-CM | POA: Insufficient documentation

## 2015-01-11 DIAGNOSIS — M549 Dorsalgia, unspecified: Secondary | ICD-10-CM | POA: Diagnosis not present

## 2015-01-12 ENCOUNTER — Emergency Department
Admission: EM | Admit: 2015-01-12 | Discharge: 2015-01-12 | Disposition: A | Discharge status: Home / Self Care | Payer: Medicaid Other | Attending: Emergency Medicine | Admitting: Emergency Medicine

## 2015-01-12 ENCOUNTER — Encounter: Payer: Self-pay | Admitting: Emergency Medicine

## 2015-01-12 DIAGNOSIS — N898 Other specified noninflammatory disorders of vagina: Secondary | ICD-10-CM

## 2015-01-12 LAB — CHLAMYDIA/NGC RT PCR (ARMC ONLY)
Chlamydia Tr: NOT DETECTED
N gonorrhoeae: NOT DETECTED

## 2015-01-12 LAB — URINALYSIS COMPLETE WITH MICROSCOPIC (ARMC ONLY)
Bacteria, UA: NONE SEEN
Bilirubin Urine: NEGATIVE
Glucose, UA: NEGATIVE mg/dL
Hgb urine dipstick: NEGATIVE
Ketones, ur: NEGATIVE mg/dL
Nitrite: NEGATIVE
PH: 8 (ref 5.0–8.0)
PROTEIN: NEGATIVE mg/dL
Specific Gravity, Urine: 1.01 (ref 1.005–1.030)

## 2015-01-12 LAB — WET PREP, GENITAL
CLUE CELLS WET PREP: NONE SEEN
Trich, Wet Prep: NONE SEEN
YEAST WET PREP: NONE SEEN

## 2015-01-12 LAB — POCT PREGNANCY, URINE: Preg Test, Ur: NEGATIVE

## 2015-01-12 NOTE — Discharge Instructions (Signed)
Please seek medical attention for any high fevers, chest pain, shortness of breath, change in behavior, persistent vomiting, bloody stool or any other new or concerning symptoms.  

## 2015-01-12 NOTE — ED Notes (Signed)
Pt reports breast tenderness for 5 days; vaginal discharge-clear; low back pain; urinary frequency;

## 2015-01-12 NOTE — ED Provider Notes (Signed)
[facility] Emergency Department Provider Note   ____________________________________________  Time seen: 63  I have reviewed the triage vital signs and the nursing notes.   HISTORY  Chief Complaint Breast Pain; Vaginal Discharge; and Back Pain   History limited by: Not Limited   HPI Yvonne Rios is a 24 y.o. female who presents to the emergency department today with concerns for breast tenderness and vaginal discharge. The patient states that the breast tenderness is been going on for roughly 1 week. It is located primarily in the right breast. She states she is also noticed some discharge from that breast. She has never had that happen to her in the past. In addition the patient states that yesterday she noticed a clear/white vaginal discharge. She denies any fevers.     Past Medical History  Diagnosis Date  . MRSA (methicillin resistant staph aureus) culture positive   . Asthma     There are no active problems to display for this patient.   History reviewed. No pertinent past surgical history.  Current Outpatient Rx  Name  Route  Sig  Dispense  Refill  . cetirizine (ZYRTEC) 10 MG tablet   Oral   Take 1 tablet (10 mg total) by mouth daily.   30 tablet   0   . sulfamethoxazole-trimethoprim (BACTRIM DS,SEPTRA DS) 800-160 MG per tablet   Oral   Take 1 tablet by mouth 2 (two) times daily.   20 tablet   0     Allergies Amoxil and Augmentin  History reviewed. No pertinent family history.  Social History History  Substance Use Topics  . Smoking status: Never Smoker   . Smokeless tobacco: Not on file  . Alcohol Use: 0.0 oz/week    0 Standard drinks or equivalent per week     Comment: rarely    Review of Systems  Constitutional: Negative for fever. Cardiovascular: Negative for chest pain. Respiratory: Negative for shortness of breath. Gastrointestinal: Negative for abdominal pain, vomiting and diarrhea. Genitourinary:  Negative for dysuria. Musculoskeletal: Negative for back pain. Skin: Negative for rash. Neurological: Negative for headaches, focal weakness or numbness.   10-point ROS otherwise negative.  ____________________________________________   PHYSICAL EXAM:  VITAL SIGNS: ED Triage Vitals  Enc Vitals Group     BP 01/12/15 0006 119/66 mmHg     Pulse Rate 01/12/15 0006 60     Resp --      Temp 01/12/15 0006 98.8 F (37.1 C)     Temp Source 01/12/15 0006 Oral     SpO2 01/12/15 0006 98 %     Weight 01/12/15 0006 230 lb (104.327 kg)     Height 01/12/15 0006  (1.727 m)     Head Cir --      Peak Flow --      Pain Score 01/12/15 0006 7   Constitutional: Alert and oriented. Well appearing and in no distress. Eyes: Conjunctivae are normal. PERRL. Normal extraocular movements. ENT   Head: Normocephalic and atraumatic.   Nose: No congestion/rhinnorhea.   Mouth/Throat: Mucous membranes are moist.   Neck: No stridor. Hematological/Lymphatic/Immunilogical: No cervical lymphadenopathy. Cardiovascular: Normal rate, regular rhythm.  No murmurs, rubs, or gallops. Respiratory: Normal respiratory effort without tachypnea nor retractions. Breath sounds are clear and equal bilaterally. No wheezes/rales/rhonchi. Gastrointestinal: Soft and nontender. No distention. There is no CVA tenderness. Genitourinary: No external lesions. No blood in vaginal vault. Small amount of clear to white fluid in vaginal vault. Musculoskeletal: Normal range of motion  in all extremities. No joint effusions.  No lower extremity tenderness nor edema. Neurologic:  Normal speech and language. No gross focal neurologic deficits are appreciated. Speech is normal.  Skin:  Skin is warm, dry and intact. No rash noted. Right breast exam without any fluctuance, tenderness or discharge elicited. Psychiatric: Mood and affect are normal. Speech and behavior are normal. Patient exhibits appropriate insight and  judgment.  ____________________________________________    LABS (pertinent positives/negatives)  Labs Reviewed  WET PREP, GENITAL - Abnormal; Notable for the following:    WBC, Wet Prep HPF POC MODERATE (*)    All other components within normal limits  URINALYSIS COMPLETEWITH MICROSCOPIC ([facility] ONLY) - Abnormal; Notable for the following:    Color, Urine YELLOW (*)    APPearance CLEAR (*)    Leukocytes, UA 1+ (*)    Squamous Epithelial / LPF 0-5 (*)    All other components within normal limits  CHLAMYDIA/NGC RT PCR ([facility] ONLY)  POCT PREGNANCY, URINE  POC URINE PREG, ED     ____________________________________________   EKG  None  ____________________________________________    RADIOLOGY  None  ____________________________________________   PROCEDURES  Procedure(s) performed: None  Critical Care performed: No  ____________________________________________   INITIAL IMPRESSION / ASSESSMENT AND PLAN / ED COURSE  Pertinent labs & imaging results that were available during my care of the patient were reviewed by me and considered in my medical decision making (see chart for details).  Patient presents to the emergency department today because of concerns for breast tenderness and vaginal discharge. Breast exam within normal limits without any concerning findings. Wet prep without any concerning clues cells, trichomoniasis or he seen. Discussed with patient that she should follow-up with her primary care doctor and OB/GYN.  ____________________________________________   FINAL CLINICAL IMPRESSION(S) / ED DIAGNOSES  Final diagnoses:  Vaginal discharge     Phineas Semen, MD 01/12/15 [phone]

## 2015-01-12 NOTE — ED Notes (Signed)
Pt. Going home with family. 

## 2015-03-04 ENCOUNTER — Ambulatory Visit
Admission: EM | Admit: 2015-03-04 | Discharge: 2015-03-04 | Disposition: A | Discharge status: Home / Self Care | Payer: Medicaid Other | Attending: Family Medicine | Admitting: Family Medicine

## 2015-03-04 ENCOUNTER — Encounter: Payer: Self-pay | Admitting: Emergency Medicine

## 2015-03-04 DIAGNOSIS — H6503 Acute serous otitis media, bilateral: Secondary | ICD-10-CM | POA: Diagnosis not present

## 2015-03-04 MED ORDER — CEFDINIR 300 MG PO CAPS: 300.0000 mg | ORAL_CAPSULE | Freq: Two times a day (BID) | ORAL | Status: DC

## 2015-03-04 NOTE — ED Provider Notes (Signed)
CSN: [id number]     Arrival date & time 03/04/15  0932 History   First MD Initiated Contact with Patient 03/04/15 1001     Chief Complaint  Patient presents with  . Otalgia  . Nasal Congestion   (Consider location/radiation/quality/duration/timing/severity/associated sxs/prior Treatment) Patient is a 24 y.o. female presenting with ear pain. The history is provided by the patient.  Otalgia Location:  Bilateral Quality:  Aching and pressure Severity:  Mild Onset quality:  Sudden Duration:  3 weeks Timing:  Constant Chronicity:  New Associated symptoms: congestion, rhinorrhea and sore throat     Past Medical History  Diagnosis Date  . MRSA (methicillin resistant staph aureus) culture positive   . Asthma    History reviewed. No pertinent past surgical history. History reviewed. No pertinent family history. Social History  Substance Use Topics  . Smoking status: Never Smoker   . Smokeless tobacco: None  . Alcohol Use: 0.0 oz/week    0 Standard drinks or equivalent per week     Comment: rarely   OB History    No data available     Review of Systems  HENT: Positive for congestion, ear pain, rhinorrhea and sore throat.     Allergies  Amoxil and Augmentin  Home Medications   Prior to Admission medications   Medication Sig Start Date End Date Taking? Authorizing Provider  albuterol (PROVENTIL HFA;VENTOLIN HFA) 108 (90 BASE) MCG/ACT inhaler Inhale 2 puffs into the lungs every 6 (six) hours as needed for wheezing or shortness of breath.   Yes Historical Provider, MD  beclomethasone (QVAR) 80 MCG/ACT inhaler Inhale 2 puffs into the lungs 2 (two) times daily.   Yes Historical Provider, MD  clonazePAM (KLONOPIN) 1 MG tablet Take 1 mg by mouth 3 (three) times daily as needed for anxiety.   Yes Historical Provider, MD  diazepam (VALIUM) 10 MG tablet Take 10 mg by mouth daily.   Yes Historical Provider, MD  lamoTRIgine (LAMICTAL) 200 MG tablet Take 200 mg by mouth 2 (two) times  daily.   Yes Historical Provider, MD  metoprolol (TOPROL-XL) 200 MG 24 hr tablet Take 200 mg by mouth daily.   Yes Historical Provider, MD  montelukast (SINGULAIR) 10 MG tablet Take 10 mg by mouth at bedtime.   Yes Historical Provider, MD  sertraline (ZOLOFT) 100 MG tablet Take 100 mg by mouth daily.   Yes Historical Provider, MD  cefdinir (OMNICEF) 300 MG capsule Take 1 capsule (300 mg total) by mouth 2 (two) times daily. 03/04/15   Payton Mccallum, MD  cetirizine (ZYRTEC) 10 MG tablet Take 1 tablet (10 mg total) by mouth daily. 11/29/14   Darci Current, MD  sulfamethoxazole-trimethoprim (BACTRIM DS,SEPTRA DS) 800-160 MG per tablet Take 1 tablet by mouth 2 (two) times daily. 12/23/14   Payton Mccallum, MD   Meds Ordered and Administered this Visit  Medications - No data to display  BP 124/91 mmHg  Pulse 60  Temp(Src) 98 F (36.7 C) (Tympanic)  Resp 16  Ht  (1.727 m)  Wt 315 lb (142.883 kg)  BMI 47.91 kg/m2  SpO2 99%  LMP 11/25/2014 (Approximate) No data found.   Physical Exam  Constitutional: She appears well-developed and well-nourished. No distress.  HENT:  Head: Normocephalic and atraumatic.  Right Ear: External ear normal. Tympanic membrane is erythematous and bulging. A middle ear effusion is present.  Left Ear: External ear normal. Tympanic membrane is erythematous and bulging. A middle ear effusion is present.  Nose: Rhinorrhea present.  Mouth/Throat: Uvula is midline. Posterior oropharyngeal erythema present. No oropharyngeal exudate, posterior oropharyngeal edema or tonsillar abscesses.  Neck: Neck supple.  Cardiovascular: Normal rate, regular rhythm and normal heart sounds.   Pulmonary/Chest: Effort normal. No respiratory distress. She has no wheezes. She has no rales.  Musculoskeletal: She exhibits no edema.  Neurological: She is alert.  Skin: No rash noted. She is not diaphoretic.  Nursing note and vitals reviewed.   ED Course  Procedures (including critical  care time)  Labs Review Labs Reviewed - No data to display  Imaging Review No results found.   Visual Acuity Review  Right Eye Distance:   Left Eye Distance:   Bilateral Distance:    Right Eye Near:   Left Eye Near:    Bilateral Near:         MDM   1. Bilateral acute serous otitis media, recurrence not specified    Discharge Medication List as of 03/04/2015 10:15 AM    START taking these medications   Details  cefdinir (OMNICEF) 300 MG capsule Take 1 capsule (300 mg total) by mouth 2 (two) times daily., Starting 03/04/2015, Until Discontinued, Normal      Plan: 1. Test/x-ray results and diagnosis reviewed with patient 2. rx as per orders; risks, benefits, potential side effects reviewed with patient 3. Recommend supportive treatment with otc analgesics 4. F/u prn if symptoms worsen or don't improve    Payton Mccallum, MD 03/04/15 [phone]

## 2015-03-04 NOTE — ED Notes (Signed)
Patient c/o watery eyes, runny nose, and itchy eyes for a month. Patient reports right ear pain for the past few days.  Patient denies fevers.

## 2015-03-13 ENCOUNTER — Emergency Department
Admission: EM | Admit: 2015-03-13 | Discharge: 2015-03-13 | Disposition: A | Discharge status: Home / Self Care | Payer: Medicaid Other | Attending: Emergency Medicine | Admitting: Emergency Medicine

## 2015-03-13 ENCOUNTER — Encounter: Payer: Self-pay | Admitting: Emergency Medicine

## 2015-03-13 DIAGNOSIS — Z79899 Other long term (current) drug therapy: Secondary | ICD-10-CM | POA: Insufficient documentation

## 2015-03-13 DIAGNOSIS — Z880 Allergy status to penicillin: Secondary | ICD-10-CM | POA: Insufficient documentation

## 2015-03-13 DIAGNOSIS — Z792 Long term (current) use of antibiotics: Secondary | ICD-10-CM | POA: Insufficient documentation

## 2015-03-13 DIAGNOSIS — H9203 Otalgia, bilateral: Secondary | ICD-10-CM | POA: Diagnosis present

## 2015-03-13 DIAGNOSIS — J029 Acute pharyngitis, unspecified: Secondary | ICD-10-CM | POA: Diagnosis not present

## 2015-03-13 MED ORDER — IBUPROFEN 800 MG PO TABS: 800.0000 mg | ORAL_TABLET | Freq: Three times a day (TID) | ORAL | Status: DC | PRN

## 2015-03-13 MED ORDER — FLUTICASONE PROPIONATE 50 MCG/ACT NA SUSP: 2.0000 | Freq: Every day | NASAL | Status: DC

## 2015-03-13 MED ORDER — IBUPROFEN 800 MG PO TABS
800.0000 mg | ORAL_TABLET | Freq: Once | ORAL | Status: AC
  Administered 2015-03-13: 800 mg via ORAL
  Filled 2015-03-13: qty 1

## 2015-03-13 NOTE — ED Provider Notes (Signed)
[facility] Emergency Department Provider Note  ____________________________________________  Time seen: Approximately 10:55 PM  I have reviewed the triage vital signs and the nursing notes.   HISTORY  Chief Complaint Otalgia and Sore Throat    HPI Yvonne Rios is a 24 y.o. female with persistent your pain, bilateral. Was seen in the urgent care approximately one week ago and treated for otitis media with Omnicef. She continues to have your pain, worse on the right. Also had some sore throat today. She denies cough. She has a history of asthma and occasional wheezing. She has not taken any ibuprofen for her pain. No fevers or chills. No nausea or vomiting.   Past Medical History  Diagnosis Date  . MRSA (methicillin resistant staph aureus) culture positive   . Asthma     There are no active problems to display for this patient.   History reviewed. No pertinent past surgical history.  Current Outpatient Rx  Name  Route  Sig  Dispense  Refill  . albuterol (PROVENTIL HFA;VENTOLIN HFA) 108 (90 BASE) MCG/ACT inhaler   Inhalation   Inhale 2 puffs into the lungs every 6 (six) hours as needed for wheezing or shortness of breath.         . beclomethasone (QVAR) 80 MCG/ACT inhaler   Inhalation   Inhale 2 puffs into the lungs 2 (two) times daily.         . cefdinir (OMNICEF) 300 MG capsule   Oral   Take 1 capsule (300 mg total) by mouth 2 (two) times daily.   20 capsule   0   . cetirizine (ZYRTEC) 10 MG tablet   Oral   Take 1 tablet (10 mg total) by mouth daily.   30 tablet   0   . clonazePAM (KLONOPIN) 1 MG tablet   Oral   Take 1 mg by mouth 3 (three) times daily as needed for anxiety.         . diazepam (VALIUM) 10 MG tablet   Oral   Take 10 mg by mouth daily.         . fluticasone (FLONASE) 50 MCG/ACT nasal spray   Each Nare   Place 2 sprays into both nostrils daily.   16 g   2   . ibuprofen (ADVIL,MOTRIN) 800 MG  tablet   Oral   Take 1 tablet (800 mg total) by mouth every 8 (eight) hours as needed.   15 tablet   0   . lamoTRIgine (LAMICTAL) 200 MG tablet   Oral   Take 200 mg by mouth 2 (two) times daily.         . metoprolol (TOPROL-XL) 200 MG 24 hr tablet   Oral   Take 200 mg by mouth daily.         . montelukast (SINGULAIR) 10 MG tablet   Oral   Take 10 mg by mouth at bedtime.         . sertraline (ZOLOFT) 100 MG tablet   Oral   Take 100 mg by mouth daily.         Marland Kitchen sulfamethoxazole-trimethoprim (BACTRIM DS,SEPTRA DS) 800-160 MG per tablet   Oral   Take 1 tablet by mouth 2 (two) times daily.   20 tablet   0     Allergies Amoxil and Augmentin  No family history on file.  Social History Social History  Substance Use Topics  . Smoking status: Never Smoker   . Smokeless tobacco: None  .  Alcohol Use: No     Comment: rarely    Review of Systems Constitutional: No fever/chills Eyes: No visual changes. ENT: sore throat., also see HPI Cardiovascular: Denies chest pain. Respiratory: Denies shortness of breath. Gastrointestinal: No abdominal pain.  No nausea, no vomiting.  Skin: Negative for rash. Neurological: Negative for headaches, focal weakness or numbness.   10-point ROS otherwise negative.  ____________________________________________   PHYSICAL EXAM:  VITAL SIGNS: ED Triage Vitals  Enc Vitals Group     BP 03/13/15 2230 119/65 mmHg     Pulse Rate 03/13/15 2230 76     Resp 03/13/15 2230 18     Temp 03/13/15 2230 98.9 F (37.2 C)     Temp Source 03/13/15 2230 Oral     SpO2 03/13/15 2230 95 %     Weight 03/13/15 2230 318 lb (144.244 kg)     Height 03/13/15 2230  (1.727 m)     Head Cir --      Peak Flow --      Pain Score 03/13/15 2231 8     Pain Loc --      Pain Edu? --      Excl. in GC? --     Constitutional: Alert and oriented. Well appearing and in no acute distress. Eyes: Conjunctivae are normal. PERRL. EOMI. Head:  Atraumatic. Nose: No congestion/rhinnorhea. EAR:  Normal landmarks with no erythema. Mild swelling, bulging of the right TM. Small amount of wax in the right canal. Mouth/Throat: Mucous membranes are moist.  Oropharynx non-erythematous. Neck: No stridor.   Hematological/Lymphatic/Immunilogical: No cervical lymphadenopathy, but tenderness in the tonsillar lymph node region. Cardiovascular: Normal rate, regular rhythm. Grossly normal heart sounds.  Good peripheral circulation. Respiratory: Normal respiratory effort.  No retractions. Expiratory wheeze in the right lung. Gastrointestinal: Soft and nontender. No distention. No abdominal bruits. No CVA tenderness. Psychiatric: Mood and affect are normal. Speech and behavior are normal.  ____________________________________________   LABS (all labs ordered are listed, but only abnormal results are displayed)  Labs Reviewed - No data to display ____________________________________________  EKG   ____________________________________________  RADIOLOGY   ____________________________________________   PROCEDURES  Procedure(s) performed: None  Critical Care performed: No  ____________________________________________   INITIAL IMPRESSION / ASSESSMENT AND PLAN / ED COURSE  Pertinent labs & imaging results that were available during my care of the patient were reviewed by me and considered in my medical decision making (see chart for details).  24 year old female with otalgia.  She has completed a course of Omnicef. No erythema or purulent effusion noted. Suspect eustachian tube dysfunction and recommend ibuprofen, Flonase. She has a appointment with Dr. Andee Poles, ENT for further evaluation in 1-2 wks. She can return to the emergency room for any concerns.   FINAL CLINICAL IMPRESSION(S) / ED DIAGNOSES  Final diagnoses:  Otalgia of both ears      Ignacia Bayley, PA-C 03/13/15 2301  Rockne Menghini, MD 03/14/15 [phone]

## 2015-03-13 NOTE — ED Notes (Signed)
Pt states she has had onset of sore throat a couple of days ago and woke up day before yesterday and her right ear was sore.  She has noted its harder for her to hear since this onset.  She has not taken any OTC meds to try and bring down her fever.

## 2015-03-13 NOTE — Discharge Instructions (Signed)
Earache An earache, also called otalgia, can be caused by many things. Pain from an earache can be sharp, dull, or burning. The pain may be temporary or constant. Earaches can be caused by problems with the ear, such as infection in either the middle ear or the ear canal, injury, impacted ear wax, middle ear pressure, or a foreign body in the ear. Ear pain can also result from problems in other areas. This is called referred pain. For example, pain can come from a sore throat, a tooth infection, or problems with the jaw or the joint between the jaw and the skull (temporomandibular joint, or TMJ). The cause of an earache is not always easy to identify. Watchful waiting may be appropriate for some earaches until a clear cause of the pain can be found. HOME CARE INSTRUCTIONS Watch your condition for any changes. The following actions may help to lessen any discomfort that you are feeling:  Take medicines only as directed by your health care provider. This includes ear drops.  Apply ice to your outer ear to help reduce pain.  Put ice in a plastic bag.  Place a towel between your skin and the bag.  Leave the ice on for 20 minutes, 2-3 times per day.  Do not put anything in your ear other than medicine that is prescribed by your health care provider.  Try resting in an upright position instead of lying down. This may help to reduce pressure in the middle ear and relieve pain.  Chew gum if it helps to relieve your ear pain.  Control any allergies that you have.  Keep all follow-up visits as directed by your health care provider. This is important. SEEK MEDICAL CARE IF:  Your pain does not improve within 2 days.  You have a fever.  You have new or worsening symptoms. SEEK IMMEDIATE MEDICAL CARE IF:  You have a severe headache.  You have a stiff neck.  You have difficulty swallowing.  You have redness or swelling behind your ear.  You have drainage from your ear.  You have hearing  loss.  You feel dizzy.   This information is not intended to replace advice given to you by your health care provider. Make sure you discuss any questions you have with your health care provider.   Document Released: 01/09/2004 Document Revised: 06/14/2014 Document Reviewed: 12/23/2013 Elsevier Interactive Patient Education 2016 [employer].   Start back on [place], 2 sprays each nostril daily. Use ibuprofen 2-3 times a day for your pain. Follow-up with Dr. Andee Poles if not improving. Return to the emergency room for any concerns.

## 2015-03-17 DIAGNOSIS — Z9109 Other allergy status, other than to drugs and biological substances: Secondary | ICD-10-CM | POA: Insufficient documentation

## 2015-03-18 ENCOUNTER — Emergency Department
Admission: EM | Admit: 2015-03-18 | Discharge: 2015-03-19 | Disposition: A | Discharge status: Home / Self Care | Payer: Medicaid Other | Attending: Emergency Medicine | Admitting: Emergency Medicine

## 2015-03-18 ENCOUNTER — Encounter: Payer: Self-pay | Admitting: Emergency Medicine

## 2015-03-18 DIAGNOSIS — G43909 Migraine, unspecified, not intractable, without status migrainosus: Secondary | ICD-10-CM | POA: Diagnosis present

## 2015-03-18 DIAGNOSIS — J45901 Unspecified asthma with (acute) exacerbation: Secondary | ICD-10-CM | POA: Diagnosis not present

## 2015-03-18 DIAGNOSIS — G43809 Other migraine, not intractable, without status migrainosus: Secondary | ICD-10-CM | POA: Diagnosis not present

## 2015-03-18 HISTORY — DX: Migraine, unspecified, not intractable, without status migrainosus: G43.909

## 2015-03-18 HISTORY — DX: Adult hypertrophic pyloric stenosis: K31.1

## 2015-03-18 NOTE — ED Notes (Addendum)
Pt presents to ED with migraine headache with sensitivity to light and nausea for the past 2 days. Pt reports her symptoms have worsened tonight. States her previously prescribed medications for migraines is not helping. Pt ambulatory to triage with a steady gait; no increased work of breathing or distress noted at this time.

## 2015-03-19 MED ORDER — DIPHENHYDRAMINE HCL 50 MG/ML IJ SOLN
INTRAMUSCULAR | Status: AC
  Filled 2015-03-19: qty 1

## 2015-03-19 MED ORDER — METHYLPREDNISOLONE SODIUM SUCC 125 MG IJ SOLR
INTRAMUSCULAR | Status: AC
  Filled 2015-03-19: qty 2

## 2015-03-19 MED ORDER — IPRATROPIUM-ALBUTEROL 0.5-2.5 (3) MG/3ML IN SOLN
RESPIRATORY_TRACT | Status: AC
  Filled 2015-03-19: qty 3

## 2015-03-19 MED ORDER — METOCLOPRAMIDE HCL 5 MG/ML IJ SOLN
INTRAMUSCULAR | Status: AC
  Filled 2015-03-19: qty 2

## 2015-03-19 NOTE — ED Notes (Signed)
IV d/c from left a/c--cannula intact, dressing applied

## 2015-03-19 NOTE — ED Provider Notes (Signed)
Patient's H&P done on downtime paperwork  Yvonne ApleyAllison P Johanan Skorupski, MD 03/19/15 [phone]

## 2015-03-19 NOTE — ED Notes (Signed)
Computer downtime--see paper chart 

## 2015-03-19 NOTE — ED Notes (Signed)
Pt reports pain decreased to 3/10

## 2015-04-09 DIAGNOSIS — Z3202 Encounter for pregnancy test, result negative: Secondary | ICD-10-CM | POA: Diagnosis not present

## 2015-04-09 DIAGNOSIS — R112 Nausea with vomiting, unspecified: Secondary | ICD-10-CM | POA: Diagnosis present

## 2015-04-09 DIAGNOSIS — Z79899 Other long term (current) drug therapy: Secondary | ICD-10-CM | POA: Insufficient documentation

## 2015-04-09 DIAGNOSIS — R1010 Upper abdominal pain, unspecified: Secondary | ICD-10-CM | POA: Diagnosis not present

## 2015-04-09 DIAGNOSIS — Z880 Allergy status to penicillin: Secondary | ICD-10-CM | POA: Insufficient documentation

## 2015-04-09 DIAGNOSIS — Z7951 Long term (current) use of inhaled steroids: Secondary | ICD-10-CM | POA: Diagnosis not present

## 2015-04-09 DIAGNOSIS — Z792 Long term (current) use of antibiotics: Secondary | ICD-10-CM | POA: Diagnosis not present

## 2015-04-09 DIAGNOSIS — R1110 Vomiting, unspecified: Secondary | ICD-10-CM | POA: Insufficient documentation

## 2015-04-09 DIAGNOSIS — R197 Diarrhea, unspecified: Secondary | ICD-10-CM | POA: Insufficient documentation

## 2015-04-09 DIAGNOSIS — Z791 Long term (current) use of non-steroidal anti-inflammatories (NSAID): Secondary | ICD-10-CM | POA: Insufficient documentation

## 2015-04-09 LAB — COMPREHENSIVE METABOLIC PANEL
ALT: 28 U/L (ref 14–54)
AST: 21 U/L (ref 15–41)
Albumin: 4.4 g/dL (ref 3.5–5.0)
Alkaline Phosphatase: 109 U/L (ref 38–126)
Anion gap: 5 (ref 5–15)
BILIRUBIN TOTAL: 0.4 mg/dL (ref 0.3–1.2)
BUN: 12 mg/dL (ref 6–20)
CHLORIDE: 111 mmol/L (ref 101–111)
CO2: 22 mmol/L (ref 22–32)
CREATININE: 0.79 mg/dL (ref 0.44–1.00)
Calcium: 9.2 mg/dL (ref 8.9–10.3)
GFR calc Af Amer: >60 mL/min (ref >60)
GLUCOSE: 148 mg/dL — AB (ref 65–99)
Potassium: 3.6 mmol/L (ref 3.5–5.1)
Sodium: 138 mmol/L (ref 135–145)
TOTAL PROTEIN: 7.9 g/dL (ref 6.5–8.1)

## 2015-04-09 LAB — URINALYSIS COMPLETE WITH MICROSCOPIC (ARMC ONLY)
BILIRUBIN URINE: NEGATIVE
Bacteria, UA: NONE SEEN
Glucose, UA: NEGATIVE mg/dL
Ketones, ur: NEGATIVE mg/dL
Nitrite: NEGATIVE
PH: 5 (ref 5.0–8.0)
Protein, ur: 30 mg/dL — AB
SPECIFIC GRAVITY, URINE: 1.026 (ref 1.005–1.030)

## 2015-04-09 LAB — CBC
HEMATOCRIT: 43.5 % (ref 35.0–47.0)
HEMOGLOBIN: 14.1 g/dL (ref 12.0–16.0)
MCH: 25.9 pg — AB (ref 26.0–34.0)
MCHC: 32.3 g/dL (ref 32.0–36.0)
MCV: 80.1 fL (ref 80.0–100.0)
Platelets: 294 10*3/uL (ref 150–440)
RBC: 5.43 MIL/uL — ABNORMAL HIGH (ref 3.80–5.20)
RDW: 15.9 % — AB (ref 11.5–14.5)
WBC: 13.9 10*3/uL — ABNORMAL HIGH (ref 3.6–11.0)

## 2015-04-09 LAB — POCT PREGNANCY, URINE: Preg Test, Ur: NEGATIVE

## 2015-04-09 LAB — PREGNANCY, URINE: Preg Test, Ur: NEGATIVE

## 2015-04-09 LAB — LIPASE, BLOOD: LIPASE: 18 U/L (ref 11–51)

## 2015-04-09 NOTE — ED Notes (Signed)
POCT results were negative 

## 2015-04-09 NOTE — ED Notes (Signed)
Vomiting and diarrhea since yest

## 2015-04-09 NOTE — ED Notes (Signed)
Lab reports redraw needed for purple top

## 2015-04-09 NOTE — ED Notes (Signed)
POCT RESULTS WERE NEGATIVE 

## 2015-04-10 ENCOUNTER — Emergency Department
Admission: EM | Admit: 2015-04-10 | Discharge: 2015-04-10 | Disposition: A | Discharge status: Home / Self Care | Payer: Medicaid Other | Attending: Emergency Medicine | Admitting: Emergency Medicine

## 2015-04-10 DIAGNOSIS — R197 Diarrhea, unspecified: Secondary | ICD-10-CM

## 2015-04-10 DIAGNOSIS — R1110 Vomiting, unspecified: Secondary | ICD-10-CM

## 2015-04-10 LAB — DIFFERENTIAL
BASOS ABS: 0.1 10*3/uL (ref 0–0.1)
Basophils Relative: 1 %
EOS PCT: 3 %
Eosinophils Absolute: 0.4 10*3/uL (ref 0–0.7)
LYMPHS PCT: 24 %
Lymphs Abs: 3.3 10*3/uL (ref 1.0–3.6)
MONO ABS: 0.7 10*3/uL (ref 0.2–0.9)
MONOS PCT: 5 %
NEUTROS PCT: 67 %
Neutro Abs: 9.3 10*3/uL — ABNORMAL HIGH (ref 1.4–6.5)

## 2015-04-10 MED ORDER — ACETAMINOPHEN 500 MG PO TABS
1000.0000 mg | ORAL_TABLET | ORAL | Status: AC
  Administered 2015-04-10: 1000 mg via ORAL
  Filled 2015-04-10: qty 2

## 2015-04-10 MED ORDER — ONDANSETRON HCL 4 MG/2ML IJ SOLN: 4.0000 mg | Freq: Four times a day (QID) | INTRAMUSCULAR | Status: DC | PRN

## 2015-04-10 MED ORDER — ONDANSETRON 4 MG PO TBDP: 4.0000 mg | ORAL_TABLET | Freq: Four times a day (QID) | ORAL | Status: DC | PRN

## 2015-04-10 MED ORDER — ALUM & MAG HYDROXIDE-SIMETH 200-200-20 MG/5ML PO SUSP
30.0000 mL | Freq: Once | ORAL | Status: AC
  Administered 2015-04-10: 30 mL via ORAL
  Filled 2015-04-10: qty 30

## 2015-04-10 MED ORDER — SODIUM CHLORIDE 0.9 % IV BOLUS (SEPSIS)
2000.0000 mL | Freq: Once | INTRAVENOUS | Status: AC
  Administered 2015-04-10: 1000 mL via INTRAVENOUS

## 2015-04-10 MED ORDER — ONDANSETRON HCL 4 MG/2ML IJ SOLN
4.0000 mg | Freq: Once | INTRAMUSCULAR | Status: AC
  Administered 2015-04-10: 4 mg via INTRAVENOUS
  Filled 2015-04-10: qty 2

## 2015-04-10 MED ORDER — LOPERAMIDE HCL 2 MG PO CAPS
2.0000 mg | ORAL_CAPSULE | Freq: Once | ORAL | Status: AC
Start: 2015-04-10 — End: 2015-04-10
  Administered 2015-04-10: 2 mg via ORAL
  Filled 2015-04-10: qty 1

## 2015-04-10 NOTE — ED Notes (Signed)
Pt a&o at this time. Pt complains of nausea and abd pain, no emesis present at this time.

## 2015-04-10 NOTE — ED Notes (Signed)
2nd bag of NS started. [facility] was for 2000 ml (only one order). Patient reports no emesis episodes since 0300. Reports feeling much better.

## 2015-04-10 NOTE — Discharge Instructions (Signed)
Please follow up closely with your regular doctor.  As we discussed, it is very important that she come back to the emergency room right away if he develop a fever, you have persistent abdominal pain especially if it is in the right lower abdomen, if you have vomiting of any blood or dark or bloody stools, he developed shaking chills, you feel weak or dehydrated, or other new concerns arise.  Nausea and Vomiting Nausea is a sick feeling that often comes before throwing up (vomiting). Vomiting is a reflex where stomach contents come out of your mouth. Vomiting can cause severe loss of body fluids (dehydration). Children and elderly adults can become dehydrated quickly, especially if they also have diarrhea. Nausea and vomiting are symptoms of a condition or disease. It is important to find the cause of your symptoms. CAUSES   Direct irritation of the stomach lining. This irritation can result from increased acid production (gastroesophageal reflux disease), infection, food poisoning, taking certain medicines (such as nonsteroidal anti-inflammatory drugs), alcohol use, or tobacco use.  Signals from the brain.These signals could be caused by a headache, heat exposure, an inner ear disturbance, increased pressure in the brain from injury, infection, a tumor, or a concussion, pain, emotional stimulus, or metabolic problems.  An obstruction in the gastrointestinal tract (bowel obstruction).  Illnesses such as diabetes, hepatitis, gallbladder problems, appendicitis, kidney problems, cancer, sepsis, atypical symptoms of a heart attack, or eating disorders.  Medical treatments such as chemotherapy and radiation.  Receiving medicine that makes you sleep (general anesthetic) during surgery. DIAGNOSIS Your caregiver may ask for tests to be done if the problems do not improve after a few days. Tests may also be done if symptoms are severe or if the reason for the nausea and vomiting is not clear. Tests may  include:  Urine tests.  Blood tests.  Stool tests.  Cultures (to look for evidence of infection).  X-rays or other imaging studies. Test results can help your caregiver make decisions about treatment or the need for additional tests. TREATMENT You need to stay well hydrated. Drink frequently but in small amounts.You may wish to drink water, sports drinks, clear broth, or eat frozen ice pops or gelatin dessert to help stay hydrated.When you eat, eating slowly may help prevent nausea.There are also some antinausea medicines that may help prevent nausea. HOME CARE INSTRUCTIONS   Take all medicine as directed by your caregiver.  If you do not have an appetite, do not force yourself to eat. However, you must continue to drink fluids.  If you have an appetite, eat a normal diet unless your caregiver tells you differently.  Eat a variety of complex carbohydrates (rice, wheat, potatoes, bread), lean meats, yogurt, fruits, and vegetables.  Avoid high-fat foods because they are more difficult to digest.  Drink enough water and fluids to keep your urine clear or pale yellow.  If you are dehydrated, ask your caregiver for specific rehydration instructions. Signs of dehydration may include:  Severe thirst.  Dry lips and mouth.  Dizziness.  Dark urine.  Decreasing urine frequency and amount.  Confusion.  Rapid breathing or pulse. SEEK IMMEDIATE MEDICAL CARE IF:   You have blood or brown flecks (like coffee grounds) in your vomit.  You have black or bloody stools.  You have a severe headache or stiff neck.  You are confused.  You have severe abdominal pain.  You have chest pain or trouble breathing.  You do not urinate at least once every 8 hours.  You develop cold or clammy skin.  You continue to vomit for longer than 24 to 48 hours.  You have a fever. MAKE SURE YOU:   Understand these instructions.  Will watch your condition.  Will get help right away if  you are not doing well or get worse.   This information is not intended to replace advice given to you by your health care provider. Make sure you discuss any questions you have with your health care provider.   Document Released: 05/24/2005 Document Revised: 08/16/2011 Document Reviewed: 10/21/2010 Elsevier Interactive Patient Education [employer]2016 Elsevier Inc.

## 2015-04-10 NOTE — ED Provider Notes (Signed)
[facility] Emergency Department Provider Note REMINDER - THIS NOTE IS NOT A FINAL MEDICAL RECORD UNTIL IT IS SIGNED. UNTIL THEN, THE CONTENT BELOW MAY REFLECT INFORMATION FROM A DOCUMENTATION TEMPLATE, NOT THE ACTUAL PATIENT VISIT. ____________________________________________  Time seen: Approximately 2:26 AM  I have reviewed the triage vital signs and the nursing notes.   HISTORY  Chief Complaint Emesis    HPI Yvonne Rios is a 24 y.o. female reports no significant medical history except for asthma and migraines.  Patient states that yesterday she began feeling nauseated, she then started having vomiting and frequent loose stools. She said she's had approximately 4-6 loose bowel movements over the last 2 days. She describes an achy and crampy abdominal pain that comes and goes and seems to get better after vomiting. She denies any bloody black or dark stools or vomit. She is able to keep water down but states that her she drinks it just comes back diarrhea.  No recent trips or travel. No known exposures to bad food or meat. No one else in the family has been sick. She denies fever. She does have abdominal pain off and on, at present she reports no pain, but when she does get it seems to be on the upper side more on the left around the area of the stomach.   Past Medical History  Diagnosis Date  . MRSA (methicillin resistant staph aureus) culture positive   . Asthma   . Migraine   . Pyloric stenosis     There are no active problems to display for this patient.   No past surgical history on file.  Current Outpatient Rx  Name  Route  Sig  Dispense  Refill  . ADDERALL XR 20 MG 24 hr capsule   Oral   Take 1 capsule by mouth 2 (two) times daily.      0     Dispense as written.   Marland Kitchen albuterol (PROVENTIL HFA;VENTOLIN HFA) 108 (90 BASE) MCG/ACT inhaler   Inhalation   Inhale 2 puffs into the lungs every 6 (six) hours as needed for wheezing or  shortness of breath.         . AMITIZA 8 MCG capsule   Oral   Take 1 capsule by mouth 2 (two) times daily.      0     Dispense as written.   . baclofen (LIORESAL) 10 MG tablet   Oral   Take 1-2 tablets by mouth at bedtime.      0   . beclomethasone (QVAR) 80 MCG/ACT inhaler   Inhalation   Inhale 2 puffs into the lungs 2 (two) times daily.         . cetirizine (ZYRTEC) 10 MG tablet   Oral   Take 1 tablet (10 mg total) by mouth daily.   30 tablet   0   . clonazePAM (KLONOPIN) 1 MG tablet   Oral   Take 1 mg by mouth 3 (three) times daily as needed for anxiety.         . diazepam (VALIUM) 10 MG tablet   Oral   Take 10 mg by mouth daily.         . fluticasone (FLONASE) 50 MCG/ACT nasal spray   Each Nare   Place 2 sprays into both nostrils daily.   16 g   2   . gabapentin (NEURONTIN) 300 MG capsule   Oral   Take 1 capsule by mouth 2 (two) times daily.  0   . ibuprofen (ADVIL,MOTRIN) 800 MG tablet   Oral   Take 1 tablet (800 mg total) by mouth every 8 (eight) hours as needed.   15 tablet   0   . ketorolac (ACULAR) 0.4 % SOLN   Both Eyes   Place 1 drop into both eyes daily.      0   . lamoTRIgine (LAMICTAL) 200 MG tablet   Oral   Take 200 mg by mouth 2 (two) times daily.         . metoprolol (TOPROL-XL) 200 MG 24 hr tablet   Oral   Take 200 mg by mouth daily.         . montelukast (SINGULAIR) 10 MG tablet   Oral   Take 10 mg by mouth at bedtime.         . nabumetone (RELAFEN) 750 MG tablet   Oral   Take 1 tablet by mouth 2 (two) times daily.      0   . rizatriptan (MAXALT) 10 MG tablet   Oral   Take 1 tablet by mouth every 2 (two) hours as needed.      0   . sertraline (ZOLOFT) 100 MG tablet   Oral   Take 100 mg by mouth daily.         Marland Kitchen topiramate (TOPAMAX) 100 MG tablet   Oral   Take 100 mg by mouth at bedtime.      0   . cefdinir (OMNICEF) 300 MG capsule   Oral   Take 1 capsule (300 mg total) by mouth 2 (two)  times daily.   20 capsule   0   . loratadine (CLARITIN) 10 MG tablet   Oral   Take 1 tablet by mouth daily.      1   . ondansetron (ZOFRAN ODT) 4 MG disintegrating tablet   Oral   Take 1 tablet (4 mg total) by mouth every 6 (six) hours as needed for nausea or vomiting.   20 tablet   0     Allergies Cefprozil; Amoxil; and Augmentin  No family history on file.  Social History Social History  Substance Use Topics  . Smoking status: Never Smoker   . Smokeless tobacco: Not on file  . Alcohol Use: No     Comment: rarely    Review of Systems Constitutional: No fever/chills Eyes: No visual changes. ENT: No sore throat. Cardiovascular: Denies chest pain. Respiratory: Denies shortness of breath. Gastrointestinal:  No constipation. Genitourinary: Negative for dysuria. Musculoskeletal: Negative for back pain. Skin: Negative for rash. Neurological: Negative for headaches, focal weakness or numbness.  No pelvic pain, no vaginal discharge  10-point ROS otherwise negative.  ____________________________________________   PHYSICAL EXAM:  VITAL SIGNS: ED Triage Vitals  Enc Vitals Group     BP 04/09/15 2259 114/55 mmHg     Pulse Rate 04/09/15 2259 61     Resp 04/09/15 2259 20     Temp 04/09/15 2259 99 F (37.2 C)     Temp Source 04/09/15 2259 Oral     SpO2 04/09/15 2259 97 %     Weight 04/09/15 2259 330 lb (149.687 kg)     Height 04/09/15 2259 5\' 8"  (1.727 m)     Head Cir --      Peak Flow --      Pain Score 04/09/15 2302 8     Pain Loc --      Pain Edu? --  Excl. in GC? --    Constitutional: Alert and oriented. Well appearing and in no acute distress. Calm, very friendly and appropriate. Eyes: Conjunctivae are normal. PERRL. EOMI. Head: Atraumatic. Nose: No congestion/rhinnorhea. Mouth/Throat: Mucous membranes are very dry.  Oropharynx non-erythematous. Neck: No stridor.   Cardiovascular: Normal rate, regular rhythm. Grossly normal heart sounds.  Good  peripheral circulation. Respiratory: Normal respiratory effort.  No retractions. Lungs CTAB. Gastrointestinal: Soft and nontender, no rebound or guarding. No distention. No abdominal bruits. No CVA tenderness. No pain to palpation in the deep right lower quadrant. Negative Murphy. No signs or symptoms to suggest acute surgical abdomen. No peritoneal signs. Musculoskeletal: No lower extremity tenderness nor edema.  No joint effusions. Neurologic:  Normal speech and language. No gross focal neurologic deficits are appreciated. Skin:  Skin is warm, dry and intact. No rash noted. Psychiatric: Mood and affect are normal. Speech and behavior are normal.  ____________________________________________   LABS (all labs ordered are listed, but only abnormal results are displayed)  Labs Reviewed  COMPREHENSIVE METABOLIC PANEL - Abnormal; Notable for the following:    Glucose, Bld 148 (*)    All other components within normal limits  URINALYSIS COMPLETEWITH MICROSCOPIC ([facility] ONLY) - Abnormal; Notable for the following:    Color, Urine YELLOW (*)    APPearance HAZY (*)    Hgb urine dipstick 2+ (*)    Protein, ur 30 (*)    Leukocytes, UA TRACE (*)    Squamous Epithelial / LPF 6-30 (*)    All other components within normal limits  CBC - Abnormal; Notable for the following:    WBC 13.9 (*)    RBC 5.43 (*)    MCH 25.9 (*)    RDW 15.9 (*)    All other components within normal limits  DIFFERENTIAL - Abnormal; Notable for the following:    Neutro Abs 9.7 (*)    All other components within normal limits  LIPASE, BLOOD  PREGNANCY, URINE  CBC WITH DIFFERENTIAL/PLATELET  POCT PREGNANCY, URINE   ____________________________________________  EKG   ____________________________________________  RADIOLOGY   ____________________________________________   PROCEDURES  Procedure(s) performed: None  Critical Care performed: No  ____________________________________________   INITIAL  IMPRESSION / ASSESSMENT AND PLAN / ED COURSE  Pertinent labs & imaging results that were available during my care of the patient were reviewed by me and considered in my medical decision making (see chart for details).  Patient presented with nausea vomiting and diarrhea. Her overall exam and vital signs are reassuring. Most likely, sinuses are present of probable self-limited illness, however consideration is made for other causes including bowel obstruction, infectious diarrhea, cholecystitis, appendicitis, urinary tract infection, and other common concerns.  Patient's labs are reassuring, she is of a slight leukocytosis which is nonspecific. LFTs are normal.  I discussed with the patient the risks and benefits of abdominal CT scan. The present time there is no clear indication that the patient requires CT, the patient does have an abdominal complaint but exam does not suggest acute surgical abdomen and my suspicion for intra-abdominal infection including appendicitis, cholecystitis, aaa, dissection, ischemia, perforation, pancreatitis, diverticulitis or other acute major intra-abdominal process is quite low. After discussing the risks and benefits including benefits of additional evaluation for diagnoses, ruling out infection/perforation/aaa/etc, but also discussing the risks including low, "well less than 1%," but not 0 risk of inducing cancers due to radiation and potential risks of contrast the patient indicated via our shared medical decision-making that she would not do a  CAT scan. Rather if the patient does have worsening symptoms, develops a high fever, develops pain or persistent discomfort in the right upper quadrant or right lower quadrant, or other new concerns arise they will come back to emergency room right away. As the patient's clinician I think this is a very reasonable decision having discussed general risks and benefits of CT, and my clinical suspicion that CT would be of benefit at  this time is very low.  ----------------------------------------- 5:10 AM on 04/10/2015 -----------------------------------------  Patient reports feeling much better. Repeat exam abdomen soft nontender nondistended all quadrants. No discharge the patient home, careful return precautions discussed. Patient and family very agreeable with plan. ____________________________________________   FINAL CLINICAL IMPRESSION(S) / ED DIAGNOSES  Final diagnoses:  Vomiting and diarrhea      Sharyn CreamerMark Quale, MD 04/10/15 [phone]

## 2015-04-26 ENCOUNTER — Encounter: Payer: Self-pay | Admitting: Emergency Medicine

## 2015-04-26 ENCOUNTER — Emergency Department
Admission: EM | Admit: 2015-04-26 | Discharge: 2015-04-26 | Discharge status: Against Medical Advice | Payer: Medicaid Other | Source: Home / Self Care

## 2015-04-26 ENCOUNTER — Emergency Department
Admission: EM | Admit: 2015-04-26 | Discharge: 2015-04-27 | Disposition: A | Discharge status: Home / Self Care | Payer: Medicaid Other | Attending: Emergency Medicine | Admitting: Emergency Medicine

## 2015-04-26 DIAGNOSIS — R51 Headache: Secondary | ICD-10-CM | POA: Insufficient documentation

## 2015-04-26 DIAGNOSIS — Z880 Allergy status to penicillin: Secondary | ICD-10-CM | POA: Insufficient documentation

## 2015-04-26 DIAGNOSIS — J069 Acute upper respiratory infection, unspecified: Secondary | ICD-10-CM | POA: Diagnosis not present

## 2015-04-26 DIAGNOSIS — H9209 Otalgia, unspecified ear: Secondary | ICD-10-CM | POA: Diagnosis not present

## 2015-04-26 DIAGNOSIS — R42 Dizziness and giddiness: Secondary | ICD-10-CM | POA: Diagnosis not present

## 2015-04-26 DIAGNOSIS — Z79899 Other long term (current) drug therapy: Secondary | ICD-10-CM | POA: Diagnosis not present

## 2015-04-26 DIAGNOSIS — J45901 Unspecified asthma with (acute) exacerbation: Secondary | ICD-10-CM | POA: Insufficient documentation

## 2015-04-26 DIAGNOSIS — J029 Acute pharyngitis, unspecified: Secondary | ICD-10-CM

## 2015-04-26 DIAGNOSIS — Z791 Long term (current) use of non-steroidal anti-inflammatories (NSAID): Secondary | ICD-10-CM | POA: Diagnosis not present

## 2015-04-26 DIAGNOSIS — Z7951 Long term (current) use of inhaled steroids: Secondary | ICD-10-CM | POA: Diagnosis not present

## 2015-04-26 LAB — POCT RAPID STREP A: Streptococcus, Group A Screen (Direct): NEGATIVE

## 2015-04-26 NOTE — ED Notes (Signed)
Pt reports bilateral ear pain and sore throat x 4 days.  Pt seen yesterday but had to leave before being seen by physician.  Pt NAD at this time.

## 2015-04-26 NOTE — ED Notes (Signed)
Pt reports 2 days of sore throat, sneezing, congestion and now loosing her voice.

## 2015-04-26 NOTE — ED Notes (Signed)
Pt arrived to the ED for complaints of sore throat, ear pain and difficulty swallowing. Pt came to the ED yesterday and left without being seen. Pt is AOx4 in no apparent distress.

## 2015-04-27 MED ORDER — BENZONATATE 100 MG PO CAPS: 100.0000 mg | ORAL_CAPSULE | Freq: Four times a day (QID) | ORAL | Status: AC | PRN

## 2015-04-27 MED ORDER — DEXAMETHASONE SODIUM PHOSPHATE 10 MG/ML IJ SOLN
10.0000 mg | Freq: Once | INTRAMUSCULAR | Status: DC
  Filled 2015-04-27: qty 1

## 2015-04-27 MED ORDER — IBUPROFEN 800 MG PO TABS
800.0000 mg | ORAL_TABLET | Freq: Once | ORAL | Status: AC
  Administered 2015-04-27: 800 mg via ORAL
  Filled 2015-04-27: qty 1

## 2015-04-27 MED ORDER — DEXAMETHASONE 10 MG/ML FOR PEDIATRIC ORAL USE
10.0000 mg | Freq: Once | INTRAMUSCULAR | Status: AC
  Administered 2015-04-27: 10 mg via ORAL

## 2015-04-27 MED ORDER — IPRATROPIUM-ALBUTEROL 0.5-2.5 (3) MG/3ML IN SOLN
3.0000 mL | Freq: Once | RESPIRATORY_TRACT | Status: AC
  Administered 2015-04-27: 3 mL via RESPIRATORY_TRACT
  Filled 2015-04-27: qty 3

## 2015-04-27 NOTE — Discharge Instructions (Signed)
Pharyngitis °Pharyngitis is redness, pain, and swelling (inflammation) of your pharynx.  °CAUSES  °Pharyngitis is usually caused by infection. Most of the time, these infections are from viruses (viral) and are part of a cold. However, sometimes pharyngitis is caused by bacteria (bacterial). Pharyngitis can also be caused by allergies. Viral pharyngitis may be spread from person to person by coughing, sneezing, and personal items or utensils (cups, forks, spoons, toothbrushes). Bacterial pharyngitis may be spread from person to person by more intimate contact, such as kissing.  °SIGNS AND SYMPTOMS  °Symptoms of pharyngitis include:   °· Sore throat.   °· Tiredness (fatigue).   °· Low-grade fever.   °· Headache. °· Joint pain and muscle aches. °· Skin rashes. °· Swollen lymph nodes. °· Plaque-like film on throat or tonsils (often seen with bacterial pharyngitis). °DIAGNOSIS  °Your health care provider will ask you questions about your illness and your symptoms. Your medical history, along with a physical exam, is often all that is needed to diagnose pharyngitis. Sometimes, a rapid strep test is done. Other lab tests may also be done, depending on the suspected cause.  °TREATMENT  °Viral pharyngitis will usually get better in 3-4 days without the use of medicine. Bacterial pharyngitis is treated with medicines that kill germs (antibiotics).  °HOME CARE INSTRUCTIONS  °· Drink enough water and fluids to keep your urine clear or pale yellow.   °· Only take over-the-counter or prescription medicines as directed by your health care provider:   °· If you are prescribed antibiotics, make sure you finish them even if you start to feel better.   °· Do not take aspirin.   °· Get lots of rest.   °· Gargle with 8 oz of salt water (½ tsp of salt per 1 qt of water) as often as every 1-2 hours to soothe your throat.   °· Throat lozenges (if you are not at risk for choking) or sprays may be used to soothe your throat. °SEEK MEDICAL  CARE IF:  °· You have large, tender lumps in your neck. °· You have a rash. °· You cough up green, yellow-brown, or bloody spit. °SEEK IMMEDIATE MEDICAL CARE IF:  °· Your neck becomes stiff. °· You drool or are unable to swallow liquids. °· You vomit or are unable to keep medicines or liquids down. °· You have severe pain that does not go away with the use of recommended medicines. °· You have trouble breathing (not caused by a stuffy nose). °MAKE SURE YOU:  °· Understand these instructions. °· Will watch your condition. °· Will get help right away if you are not doing well or get worse. °  °This information is not intended to replace advice given to you by your health care provider. Make sure you discuss any questions you have with your health care provider. °  °Document Released: 05/24/2005 Document Revised: 03/14/2013 Document Reviewed: 01/29/2013 °Elsevier Interactive Patient Education ©2016 [employer] Inc[employer] °Upper Respiratory Infection, Adult °Most upper respiratory infections (URIs) are a viral infection of the air passages leading to the lungs. A URI affects the nose, throat, and upper air passages. The most common type of URI is nasopharyngitis and is typically referred to as "the common cold." °URIs run their course and usually go away on their own. Most of the time, a URI does not require medical attention, but sometimes a bacterial infection in the upper airways can follow a viral infection. This is called a secondary infection. Sinus and middle ear infections are common types of secondary upper respiratory infections. °Bacterial pneumonia   can also complicate a URI. A URI can worsen asthma and chronic obstructive pulmonary disease (COPD). Sometimes, these complications can require emergency medical care and may be life threatening.  °CAUSES °Almost all URIs are caused by viruses. A virus is a type of germ and can spread from one person to another.  °RISKS FACTORS °You may be at risk for a URI if:  °· You  smoke.   °· You have chronic heart or lung disease. °· You have a weakened defense (immune) system.   °· You are very young or very old.   °· You have nasal allergies or asthma. °· You work in crowded or poorly ventilated areas. °· You work in health care facilities or schools. °SIGNS AND SYMPTOMS  °Symptoms typically develop 2-3 days after you come in contact with a cold virus. Most viral URIs last 7-10 days. However, viral URIs from the influenza virus (flu virus) can last 14-18 days and are typically more severe. Symptoms may include:  °· Runny or stuffy (congested) nose.   °· Sneezing.   °· Cough.   °· Sore throat.   °· Headache.   °· Fatigue.   °· Fever.   °· Loss of appetite.   °· Pain in your forehead, behind your eyes, and over your cheekbones (sinus pain). °· Muscle aches.   °DIAGNOSIS  °Your health care provider may diagnose a URI by: °· Physical exam. °· Tests to check that your symptoms are not due to another condition such as: °¨ Strep throat. °¨ Sinusitis. °¨ Pneumonia. °¨ Asthma. °TREATMENT  °A URI goes away on its own with time. It cannot be cured with medicines, but medicines may be prescribed or recommended to relieve symptoms. Medicines may help: °· Reduce your fever. °· Reduce your cough. °· Relieve nasal congestion. °HOME CARE INSTRUCTIONS  °· Take medicines only as directed by your health care provider.   °· Gargle warm saltwater or take cough drops to comfort your throat as directed by your health care provider. °· Use a warm mist humidifier or inhale steam from a shower to increase air moisture. This may make it easier to breathe. °· Drink enough fluid to keep your urine clear or pale yellow.   °· Eat soups and other clear broths and maintain good nutrition.   °· Rest as needed.   °· Return to work when your temperature has returned to normal or as your health care provider advises. You may need to stay home longer to avoid infecting others. You can also use a face mask and careful hand  washing to prevent spread of the virus. °· Increase the usage of your inhaler if you have asthma.   °· Do not use any tobacco products, including cigarettes, chewing tobacco, or electronic cigarettes. If you need help quitting, ask your health care provider. °PREVENTION  °The best way to protect yourself from getting a cold is to practice good hygiene.  °· Avoid oral or hand contact with people with cold symptoms.   °· Wash your hands often if contact occurs.   °There is no clear evidence that vitamin C, vitamin E, echinacea, or exercise reduces the chance of developing a cold. However, it is always recommended to get plenty of rest, exercise, and practice good nutrition.  °SEEK MEDICAL CARE IF:  °· You are getting worse rather than better.   °· Your symptoms are not controlled by medicine.   °· You have chills. °· You have worsening shortness of breath. °· You have brown or red mucus. °· You have yellow or brown nasal discharge. °· You have pain in your face, especially   when you bend forward. °· You have a fever. °· You have swollen neck glands. °· You have pain while swallowing. °· You have white areas in the back of your throat. °SEEK IMMEDIATE MEDICAL CARE IF:  °· You have severe or persistent: °¨ Headache. °¨ Ear pain. °¨ Sinus pain. °¨ Chest pain. °· You have chronic lung disease and any of the following: °¨ Wheezing. °¨ Prolonged cough. °¨ Coughing up blood. °¨ A change in your usual mucus. °· You have a stiff neck. °· You have changes in your: °¨ Vision. °¨ Hearing. °¨ Thinking. °¨ Mood. °MAKE SURE YOU:  °· Understand these instructions. °· Will watch your condition. °· Will get help right away if you are not doing well or get worse. °  °This information is not intended to replace advice given to you by your health care provider. Make sure you discuss any questions you have with your health care provider. °  °Document Released: 11/17/2000 Document Revised: 10/08/2014 Document Reviewed: 08/29/2013 °Elsevier  Interactive Patient Education ©2016 [employer] ° °

## 2015-04-27 NOTE — ED Provider Notes (Signed)
[facility] Emergency Department Provider Note  ____________________________________________  Time seen: Approximately 2345 PM  I have reviewed the triage vital signs and the nursing notes.   HISTORY  Chief Complaint Otalgia and Sore Throat    HPI Yvonne Rios is a 24 y.o. female who comes into the hospital today with throat pain and ear pain. The patient reports that her throat started hurting 4 days ago and has been getting worse. She also reports that she's been losing her voice. The patient has had a cough and reports her ears a been bothering her. The patient has not taken anything for pain but reports that Tylenol and ibuprofen don't typically work for her. She denies any fever but has had a runny nose with congestion as well. House has been sick. She rates her throat pain 8 out of 10 in intensity. The patient also reports shortness of breath but does have a history of asthma. She took a neb last night. She is also complaining of chest tightness and some dizziness. The patient denies nausea or vomiting but reports she's had some difficulty eating with the sore throat. The patient comes in tonight she is unsure what to do and decided to get checked out. The patient was here in the emergency department yesterday but left prior to being seen.   Past Medical History  Diagnosis Date  . MRSA (methicillin resistant staph aureus) culture positive   . Asthma   . Migraine   . Pyloric stenosis     There are no active problems to display for this patient.   History reviewed. No pertinent past surgical history.  Current Outpatient Rx  Name  Route  Sig  Dispense  Refill  . ADDERALL XR 20 MG 24 hr capsule   Oral   Take 1 capsule by mouth 2 (two) times daily.      0     Dispense as written.   Marland Kitchen albuterol (PROVENTIL HFA;VENTOLIN HFA) 108 (90 BASE) MCG/ACT inhaler   Inhalation   Inhale 2 puffs into the lungs every 6 (six) hours as needed for wheezing or  shortness of breath.         . AMITIZA 8 MCG capsule   Oral   Take 1 capsule by mouth 2 (two) times daily.      0     Dispense as written.   . baclofen (LIORESAL) 10 MG tablet   Oral   Take 1-2 tablets by mouth at bedtime.      0   . beclomethasone (QVAR) 80 MCG/ACT inhaler   Inhalation   Inhale 2 puffs into the lungs 2 (two) times daily.         . cetirizine (ZYRTEC) 10 MG tablet   Oral   Take 1 tablet (10 mg total) by mouth daily.   30 tablet   0   . clonazePAM (KLONOPIN) 1 MG tablet   Oral   Take 1 mg by mouth 3 (three) times daily as needed for anxiety.         . diazepam (VALIUM) 10 MG tablet   Oral   Take 10 mg by mouth daily.         Marland Kitchen gabapentin (NEURONTIN) 300 MG capsule   Oral   Take 1 capsule by mouth 2 (two) times daily.      0   . lamoTRIgine (LAMICTAL) 200 MG tablet   Oral   Take 200 mg by mouth 2 (two) times daily.         Marland Kitchen  nabumetone (RELAFEN) 750 MG tablet   Oral   Take 1 tablet by mouth 2 (two) times daily.      0   . ondansetron (ZOFRAN ODT) 4 MG disintegrating tablet   Oral   Take 1 tablet (4 mg total) by mouth every 6 (six) hours as needed for nausea or vomiting.   20 tablet   0   . rizatriptan (MAXALT) 10 MG tablet   Oral   Take 1 tablet by mouth every 2 (two) hours as needed.      0   . sertraline (ZOLOFT) 100 MG tablet   Oral   Take 100 mg by mouth daily.         Marland Kitchen topiramate (TOPAMAX) 100 MG tablet   Oral   Take 100 mg by mouth at bedtime.      0   . benzonatate (TESSALON PERLES) 100 MG capsule   Oral   Take 1 capsule (100 mg total) by mouth every 6 (six) hours as needed for cough.   15 capsule   0   . cefdinir (OMNICEF) 300 MG capsule   Oral   Take 1 capsule (300 mg total) by mouth 2 (two) times daily.   20 capsule   0   . fluticasone (FLONASE) 50 MCG/ACT nasal spray   Each Nare   Place 2 sprays into both nostrils daily.   16 g   2   . ibuprofen (ADVIL,MOTRIN) 800 MG tablet   Oral   Take  1 tablet (800 mg total) by mouth every 8 (eight) hours as needed.   15 tablet   0   . ketorolac (ACULAR) 0.4 % SOLN   Both Eyes   Place 1 drop into both eyes daily.      0   . loratadine (CLARITIN) 10 MG tablet   Oral   Take 1 tablet by mouth daily.      1   . metoprolol (TOPROL-XL) 200 MG 24 hr tablet   Oral   Take 200 mg by mouth daily.         . montelukast (SINGULAIR) 10 MG tablet   Oral   Take 10 mg by mouth at bedtime.           Allergies Cefprozil; Amoxil; and Augmentin  History reviewed. No pertinent family history.  Social History Social History  Substance Use Topics  . Smoking status: Never Smoker   . Smokeless tobacco: None  . Alcohol Use: No     Comment: rarely    Review of Systems Constitutional: No fever/chills Eyes: No visual changes. ENT: sore throat and ear pain Cardiovascular: Chest tightness Respiratory:shortness of breath. Gastrointestinal: No abdominal pain.  No nausea, no vomiting.  No diarrhea.  No constipation. Genitourinary: Negative for dysuria. Musculoskeletal: Negative for back pain. Skin: Negative for rash. Neurological: Headache and dizziness  10-point ROS otherwise negative.  ____________________________________________   PHYSICAL EXAM:  VITAL SIGNS: ED Triage Vitals  Enc Vitals Group     BP 04/26/15 2331 121/81 mmHg     Pulse Rate 04/26/15 2331 90     Resp 04/26/15 2331 18     Temp 04/26/15 2331 97.8 F (36.6 C)     Temp Source 04/26/15 2331 Oral     SpO2 04/26/15 2331 99 %     Weight 04/26/15 2331 330 lb (149.687 kg)     Height 04/26/15 2331  (1.727 m)     Head Cir --      Peak  Flow --      Pain Score 04/26/15 2332 8     Pain Loc --      Pain Edu? --      Excl. in GC? --     Constitutional: Alert and oriented. Well appearing and in no acute distress. Eyes: Conjunctivae are normal. PERRL. EOMI. Ears: Gray flat and dull without erythema or bulging Head: Atraumatic. Nose: No  congestion/rhinnorhea. Mouth/Throat: Mucous membranes are moist.  Oropharynx non-erythematous. Hematological/Lymphatic/Immunilogical: No cervical lymphadenopathy. Cardiovascular: Normal rate, regular rhythm. Grossly normal heart sounds.  Good peripheral circulation. Respiratory: Normal respiratory effort.  No retractions. Lungs CTAB. Gastrointestinal: Soft and nontender. No distention.  Musculoskeletal: No lower extremity tenderness nor edema.  Neurologic:  Normal speech and language. Skin:  Skin is warm, dry and intact.  Psychiatric: Mood and affect are normal.   ____________________________________________   LABS (all labs ordered are listed, but only abnormal results are displayed)  Labs Reviewed - No data to display ____________________________________________  EKG  None ____________________________________________  RADIOLOGY  None ____________________________________________   PROCEDURES  Procedure(s) performed: None  Critical Care performed: No  ____________________________________________   INITIAL IMPRESSION / ASSESSMENT AND PLAN / ED COURSE  Pertinent labs & imaging results that were available during my care of the patient were reviewed by me and considered in my medical decision making (see chart for details).  This is a 24 year old female who comes in today with some sore throat, ear pain, cough and congestion. The patient did have a strep swab done yesterday that was negative. I gave the patient a dose of Decadron as well as a DuoNeb and ibuprofen for her symptoms. She reports that her chest tightness of breathing feel improved and her throat also feels better. I will discharge the patient home and have her follow-up with her primary care physician. The patient has no further complaints or concerns or be discharged home. ____________________________________________   FINAL CLINICAL IMPRESSION(S) / ED DIAGNOSES  Final diagnoses:  Pharyngitis  Upper  respiratory infection      Rebecka ApleyAllison P Teandra Harlan, MD 04/27/15 0120

## 2015-04-28 LAB — CULTURE, GROUP A STREP (THRC)

## 2015-04-30 ENCOUNTER — Encounter: Payer: Self-pay | Admitting: *Deleted

## 2015-04-30 ENCOUNTER — Ambulatory Visit
Admission: EM | Admit: 2015-04-30 | Discharge: 2015-04-30 | Disposition: A | Discharge status: Home / Self Care | Payer: Medicaid Other | Attending: Family Medicine | Admitting: Family Medicine

## 2015-04-30 DIAGNOSIS — J029 Acute pharyngitis, unspecified: Secondary | ICD-10-CM | POA: Diagnosis present

## 2015-04-30 DIAGNOSIS — Z880 Allergy status to penicillin: Secondary | ICD-10-CM | POA: Insufficient documentation

## 2015-04-30 DIAGNOSIS — H9209 Otalgia, unspecified ear: Secondary | ICD-10-CM | POA: Diagnosis present

## 2015-04-30 DIAGNOSIS — Z79899 Other long term (current) drug therapy: Secondary | ICD-10-CM | POA: Diagnosis not present

## 2015-04-30 DIAGNOSIS — H6502 Acute serous otitis media, left ear: Secondary | ICD-10-CM

## 2015-04-30 LAB — RAPID STREP SCREEN (MED CTR MEBANE ONLY): STREPTOCOCCUS, GROUP A SCREEN (DIRECT): NEGATIVE

## 2015-04-30 MED ORDER — SULFAMETHOXAZOLE-TRIMETHOPRIM 800-160 MG PO TABS: 1.0000 | ORAL_TABLET | Freq: Two times a day (BID) | ORAL | Status: DC

## 2015-04-30 NOTE — ED Notes (Signed)
Pt states that she has a sore throat, ear pain in both ears and congestion that started about 1 week ago.

## 2015-04-30 NOTE — ED Provider Notes (Signed)
CSN: [id number]     Arrival date & time 04/30/15  1903 History   First MD Initiated Contact with Patient 04/30/15 2008     Chief Complaint  Patient presents with  . Sore Throat  . Otalgia   (Consider location/radiation/quality/duration/timing/severity/associated sxs/prior Treatment) Patient is a 24 y.o. female presenting with URI. The history is provided by the patient.  URI Presenting symptoms: congestion, ear pain, rhinorrhea and sore throat   Severity:  Moderate Onset quality:  Sudden Duration:  1 week Timing:  Constant Progression:  Worsening Chronicity:  New Ineffective treatments:  None tried   Past Medical History  Diagnosis Date  . MRSA (methicillin resistant staph aureus) culture positive   . Asthma   . Migraine   . Pyloric stenosis    History reviewed. No pertinent past surgical history. No family history on file. Social History  Substance Use Topics  . Smoking status: Never Smoker   . Smokeless tobacco: None  . Alcohol Use: No     Comment: rarely   OB History    No data available     Review of Systems  HENT: Positive for congestion, ear pain, rhinorrhea and sore throat.     Allergies  Cefprozil; Amoxil; and Augmentin  Home Medications   Prior to Admission medications   Medication Sig Start Date End Date Taking? Authorizing Provider  ADDERALL XR 20 MG 24 hr capsule Take 1 capsule by mouth 2 (two) times daily. 03/05/15   Historical Provider, MD  albuterol (PROVENTIL HFA;VENTOLIN HFA) 108 (90 BASE) MCG/ACT inhaler Inhale 2 puffs into the lungs every 6 (six) hours as needed for wheezing or shortness of breath.    Historical Provider, MD  AMITIZA 8 MCG capsule Take 1 capsule by mouth 2 (two) times daily. 03/14/15   Historical Provider, MD  baclofen (LIORESAL) 10 MG tablet Take 1-2 tablets by mouth at bedtime. 03/06/15   Historical Provider, MD  beclomethasone (QVAR) 80 MCG/ACT inhaler Inhale 2 puffs into the lungs 2 (two) times daily.    Historical  Provider, MD  benzonatate (TESSALON PERLES) 100 MG capsule Take 1 capsule (100 mg total) by mouth every 6 (six) hours as needed for cough. 04/27/15 04/26/16  Rebecka Apley, MD  cefdinir (OMNICEF) 300 MG capsule Take 1 capsule (300 mg total) by mouth 2 (two) times daily. 03/04/15   Payton Mccallum, MD  cetirizine (ZYRTEC) 10 MG tablet Take 1 tablet (10 mg total) by mouth daily. 11/29/14   Darci Current, MD  clonazePAM (KLONOPIN) 1 MG tablet Take 1 mg by mouth 3 (three) times daily as needed for anxiety.    Historical Provider, MD  diazepam (VALIUM) 10 MG tablet Take 10 mg by mouth daily.    Historical Provider, MD  fluticasone (FLONASE) 50 MCG/ACT nasal spray Place 2 sprays into both nostrils daily. 03/13/15 03/12/16  Ignacia Bayley, PA-C  gabapentin (NEURONTIN) 300 MG capsule Take 1 capsule by mouth 2 (two) times daily. 03/13/15   Historical Provider, MD  ibuprofen (ADVIL,MOTRIN) 800 MG tablet Take 1 tablet (800 mg total) by mouth every 8 (eight) hours as needed. 03/13/15   Ignacia Bayley, PA-C  ketorolac (ACULAR) 0.4 % SOLN Place 1 drop into both eyes daily. 03/04/15   Historical Provider, MD  lamoTRIgine (LAMICTAL) 200 MG tablet Take 200 mg by mouth 2 (two) times daily.    Historical Provider, MD  loratadine (CLARITIN) 10 MG tablet Take 1 tablet by mouth daily. 03/13/15   Historical Provider, MD  metoprolol (TOPROL-XL) 200  MG 24 hr tablet Take 200 mg by mouth daily.    Historical Provider, MD  montelukast (SINGULAIR) 10 MG tablet Take 10 mg by mouth at bedtime.    Historical Provider, MD  nabumetone (RELAFEN) 750 MG tablet Take 1 tablet by mouth 2 (two) times daily. 03/06/15   Historical Provider, MD  ondansetron (ZOFRAN ODT) 4 MG disintegrating tablet Take 1 tablet (4 mg total) by mouth every 6 (six) hours as needed for nausea or vomiting. 04/10/15   Sharyn CreamerMark Quale, MD  rizatriptan (MAXALT) 10 MG tablet Take 1 tablet by mouth every 2 (two) hours as needed. 03/06/15   Historical Provider, MD  sertraline  (ZOLOFT) 100 MG tablet Take 100 mg by mouth daily.    Historical Provider, MD  sulfamethoxazole-trimethoprim (BACTRIM DS,SEPTRA DS) 800-160 MG tablet Take 1 tablet by mouth 2 (two) times daily. 04/30/15   Payton Mccallumrlando Rasheed Welty, MD  topiramate (TOPAMAX) 100 MG tablet Take 100 mg by mouth at bedtime. 03/06/15   Historical Provider, MD   Meds Ordered and Administered this Visit  Medications - No data to display  BP 111/63 mmHg  Pulse 61  Temp(Src) 99.1 F (37.3 C) (Tympanic)  Ht 5\' 8"  (1.727 m)  Wt 330 lb (149.687 kg)  BMI 50.19 kg/m2  SpO2 99%  LMP 11/20/2014 (Approximate) No data found.   Physical Exam  Constitutional: She appears well-developed and well-nourished. No distress.  HENT:  Head: Normocephalic and atraumatic.  Right Ear: Tympanic membrane, external ear and ear canal normal.  Left Ear: External ear and ear canal normal. Tympanic membrane is erythematous and bulging.  Nose: Mucosal edema and rhinorrhea present. No nose lacerations, sinus tenderness, nasal deformity, septal deviation or nasal septal hematoma. No epistaxis.  No foreign bodies.  Mouth/Throat: Uvula is midline, oropharynx is clear and moist and mucous membranes are normal. No oropharyngeal exudate.  Eyes: Conjunctivae and EOM are normal. Pupils are equal, round, and reactive to light. Right eye exhibits no discharge. Left eye exhibits no discharge. No scleral icterus.  Neck: Normal range of motion. Neck supple. No thyromegaly present.  Cardiovascular: Normal rate, regular rhythm and normal heart sounds.   Pulmonary/Chest: Effort normal and breath sounds normal. No respiratory distress. She has no wheezes. She has no rales.  Lymphadenopathy:    She has no cervical adenopathy.  Skin: No rash noted. She is not diaphoretic.  Nursing note and vitals reviewed.   ED Course  Procedures (including critical care time)  Labs Review Labs Reviewed  RAPID STREP SCREEN (NOT AT [facility])  CULTURE, GROUP A STREP ([facility] ONLY)     Imaging Review No results found.   Visual Acuity Review  Right Eye Distance:   Left Eye Distance:   Bilateral Distance:    Right Eye Near:   Left Eye Near:    Bilateral Near:         MDM   1. Acute serous otitis media of left ear, recurrence not specified    Discharge Medication List as of 04/30/2015  8:21 PM    START taking these medications   Details  sulfamethoxazole-trimethoprim (BACTRIM DS,SEPTRA DS) 800-160 MG tablet Take 1 tablet by mouth 2 (two) times daily., Starting 04/30/2015, Until Discontinued, Normal       1. diagnosis reviewed with patient 2. rx as per orders above; reviewed possible side effects, interactions, risks and benefits  3. Recommend supportive treatment with increased fluids, otc analgesics  4. Follow-up prn if symptoms worsen or don't improve    Payton Mccallumrlando Cayle Thunder, MD  04/30/15 2032 

## 2015-05-02 LAB — CULTURE, GROUP A STREP (THRC)

## 2015-06-23 ENCOUNTER — Emergency Department
Admission: EM | Admit: 2015-06-23 | Discharge: 2015-06-23 | Disposition: A | Discharge status: Home / Self Care | Payer: Medicaid Other | Attending: Emergency Medicine | Admitting: Emergency Medicine

## 2015-06-23 ENCOUNTER — Encounter: Payer: Self-pay | Admitting: *Deleted

## 2015-06-23 DIAGNOSIS — Z791 Long term (current) use of non-steroidal anti-inflammatories (NSAID): Secondary | ICD-10-CM | POA: Insufficient documentation

## 2015-06-23 DIAGNOSIS — J0100 Acute maxillary sinusitis, unspecified: Secondary | ICD-10-CM | POA: Diagnosis not present

## 2015-06-23 DIAGNOSIS — Z880 Allergy status to penicillin: Secondary | ICD-10-CM | POA: Insufficient documentation

## 2015-06-23 DIAGNOSIS — J45901 Unspecified asthma with (acute) exacerbation: Secondary | ICD-10-CM | POA: Insufficient documentation

## 2015-06-23 DIAGNOSIS — Z7951 Long term (current) use of inhaled steroids: Secondary | ICD-10-CM | POA: Insufficient documentation

## 2015-06-23 DIAGNOSIS — Z79899 Other long term (current) drug therapy: Secondary | ICD-10-CM | POA: Diagnosis not present

## 2015-06-23 DIAGNOSIS — H65191 Other acute nonsuppurative otitis media, right ear: Secondary | ICD-10-CM | POA: Insufficient documentation

## 2015-06-23 DIAGNOSIS — J40 Bronchitis, not specified as acute or chronic: Secondary | ICD-10-CM

## 2015-06-23 DIAGNOSIS — H65111 Acute and subacute allergic otitis media (mucoid) (sanguinous) (serous), right ear: Secondary | ICD-10-CM

## 2015-06-23 DIAGNOSIS — H9203 Otalgia, bilateral: Secondary | ICD-10-CM | POA: Diagnosis present

## 2015-06-23 MED ORDER — CETIRIZINE HCL 10 MG PO TABS: 10.0000 mg | ORAL_TABLET | Freq: Every day | ORAL | Status: DC

## 2015-06-23 MED ORDER — FLUTICASONE PROPIONATE 50 MCG/ACT NA SUSP: 1.0000 | Freq: Two times a day (BID) | NASAL | Status: DC

## 2015-06-23 MED ORDER — ALBUTEROL SULFATE (2.5 MG/3ML) 0.083% IN NEBU
2.5000 mg | INHALATION_SOLUTION | Freq: Once | RESPIRATORY_TRACT | Status: AC
  Administered 2015-06-23: 2.5 mg via RESPIRATORY_TRACT
  Filled 2015-06-23: qty 3

## 2015-06-23 MED ORDER — ALBUTEROL SULFATE HFA 108 (90 BASE) MCG/ACT IN AERS: 2.0000 | INHALATION_SPRAY | RESPIRATORY_TRACT | Status: DC | PRN

## 2015-06-23 MED ORDER — DOXYCYCLINE HYCLATE 100 MG PO TABS: 100.0000 mg | ORAL_TABLET | Freq: Two times a day (BID) | ORAL | Status: DC

## 2015-06-23 MED ORDER — PREDNISONE 10 MG PO TABS
10.0000 mg | ORAL_TABLET | ORAL | Status: DC
Start: 2015-06-23 — End: 2016-01-25

## 2015-06-23 NOTE — Discharge Instructions (Signed)
Otitis Media, Adult  Otitis media is redness, soreness, and inflammation of the middle ear. Otitis media may be caused by allergies or, most commonly, by infection. Often it occurs as a complication of the common cold.  SIGNS AND SYMPTOMS  Symptoms of otitis media may include:   Earache.   Fever.   Ringing in your ear.   Headache.   Leakage of fluid from the ear.  DIAGNOSIS  To diagnose otitis media, your health care provider will examine your ear with an otoscope. This is an instrument that allows your health care provider to see into your ear in order to examine your eardrum. Your health care provider also will ask you questions about your symptoms.  TREATMENT   Typically, otitis media resolves on its own within 3-5 days. Your health care provider may prescribe medicine to ease your symptoms of pain. If otitis media does not resolve within 5 days or is recurrent, your health care provider may prescribe antibiotic medicines if he or she suspects that a bacterial infection is the cause.  HOME CARE INSTRUCTIONS    If you were prescribed an antibiotic medicine, finish it all even if you start to feel better.   Take medicines only as directed by your health care provider.   Keep all follow-up visits as directed by your health care provider.  SEEK MEDICAL CARE IF:   You have otitis media only in one ear, or bleeding from your nose, or both.   You notice a lump on your neck.   You are not getting better in 3-5 days.   You feel worse instead of better.  SEEK IMMEDIATE MEDICAL CARE IF:    You have pain that is not controlled with medicine.   You have swelling, redness, or pain around your ear or stiffness in your neck.   You notice that part of your face is paralyzed.   You notice that the bone behind your ear (mastoid) is tender when you touch it.  MAKE SURE YOU:    Understand these instructions.   Will watch your condition.   Will get help right away if you are not doing well or get worse.     This  information is not intended to replace advice given to you by your health care provider. Make sure you discuss any questions you have with your health care provider.     Document Released: 02/27/2004 Document Revised: 06/14/2014 Document Reviewed: 12/19/2012  Elsevier Interactive Patient Education 2016 Else[employer] Inc[employer]  Sinusitis, Adult  Sinusitis is redness, soreness, and inflammation of the paranasal sinuses. Paranasal sinuses are air pockets within the bones of your face. They are located beneath your eyes, in the middle of your forehead, and above your eyes. In healthy paranasal sinuses, mucus is able to drain out, and air is able to circulate through them by way of your nose. However, when your paranasal sinuses are inflamed, mucus and air can become trapped. This can allow bacteria and other germs to grow and cause infection.  Sinusitis can develop quickly and last only a short time (acute) or continue over a long period (chronic). Sinusitis that lasts for more than 12 weeks is considered chronic.  CAUSES  Causes of sinusitis include:   Allergies.   Structural abnormalities, such as displacement of the cartilage that separates your nostrils (deviated septum), which can decrease the air flow through your nose and sinuses and affect sinus drainage.   Functional abnormalities, such as when the small hairs (cilia) that   Upper toothache.  Earache.  Headache.  Bad breath.  Decreased sense of smell and taste.  A cough, which worsens when you are lying flat.  Fatigue.  Fever.  Thick drainage from your nose, which often is green and may contain pus (purulent).  Swelling and warmth over the affected sinuses. DIAGNOSIS Your health care provider will  perform a physical exam. During your exam, your health care provider may perform any of the following to help determine if you have acute sinusitis or chronic sinusitis:  Look in your nose for signs of abnormal growths in your nostrils (nasal polyps).  Tap over the affected sinus to check for signs of infection.  View the inside of your sinuses using an imaging device that has a light attached (endoscope). If your health care provider suspects that you have chronic sinusitis, one or more of the following tests may be recommended:  Allergy tests.  Nasal culture. A sample of mucus is taken from your nose, sent to a lab, and screened for bacteria.  Nasal cytology. A sample of mucus is taken from your nose and examined by your health care provider to determine if your sinusitis is related to an allergy. TREATMENT Most cases of acute sinusitis are related to a viral infection and will resolve on their own within 10 days. Sometimes, medicines are prescribed to help relieve symptoms of both acute and chronic sinusitis. These may include pain medicines, decongestants, nasal steroid sprays, or saline sprays. However, for sinusitis related to a bacterial infection, your health care provider will prescribe antibiotic medicines. These are medicines that will help kill the bacteria causing the infection. Rarely, sinusitis is caused by a fungal infection. In these cases, your health care provider will prescribe antifungal medicine. For some cases of chronic sinusitis, surgery is needed. Generally, these are cases in which sinusitis recurs more than 3 times per year, despite other treatments. HOME CARE INSTRUCTIONS  Drink plenty of water. Water helps thin the mucus so your sinuses can drain more easily.  Use a humidifier.  Inhale steam 3-4 times a day (for example, sit in the bathroom with the shower running).  Apply a warm, moist washcloth to your face 3-4 times a day, or as directed by your health care  provider.  Use saline nasal sprays to help moisten and clean your sinuses.  Take medicines only as directed by your health care provider.  If you were prescribed either an antibiotic or antifungal medicine, finish it all even if you start to feel better. SEEK IMMEDIATE MEDICAL CARE IF:  You have increasing pain or severe headaches.  You have nausea, vomiting, or drowsiness.  You have swelling around your face.  You have vision problems.  You have a stiff neck.  You have difficulty breathing.   This information is not intended to replace advice given to you by your health care provider. Make sure you discuss any questions you have with your health care provider.   Document Released: 05/24/2005 Document Revised: 06/14/2014 Document Reviewed: 06/08/2011 Elsevier Interactive Patient Education 2016 Else[employer] Inc.  Upper Respiratory Infection, Adult Most upper respiratory infections (URIs) are a viral infection of the air passages leading to the lungs. A URI affects the nose, throat, and upper air passages. The most common type of URI is nasopharyngitis and is typically referred to as "the common cold." URIs run their course and usually go away on their own. Most of the time, a URI does not require medical attention, but sometimes a bacterial infection  in the upper airways can follow a viral infection. This is called a secondary infection. Sinus and middle ear infections are common types of secondary upper respiratory infections. Bacterial pneumonia can also complicate a URI. A URI can worsen asthma and chronic obstructive pulmonary disease (COPD). Sometimes, these complications can require emergency medical care and may be life threatening.  CAUSES Almost all URIs are caused by viruses. A virus is a type of germ and can spread from one person to another.  RISKS FACTORS You may be at risk for a URI if:   You smoke.   You have chronic heart or lung disease.  You have a weakened  defense (immune) system.   You are very young or very old.   You have nasal allergies or asthma.  You work in crowded or poorly ventilated areas.  You work in health care facilities or schools. SIGNS AND SYMPTOMS  Symptoms typically develop 2-3 days after you come in contact with a cold virus. Most viral URIs last 7-10 days. However, viral URIs from the influenza virus (flu virus) can last 14-18 days and are typically more severe. Symptoms may include:   Runny or stuffy (congested) nose.   Sneezing.   Cough.   Sore throat.   Headache.   Fatigue.   Fever.   Loss of appetite.   Pain in your forehead, behind your eyes, and over your cheekbones (sinus pain).  Muscle aches.  DIAGNOSIS  Your health care provider may diagnose a URI by:  Physical exam.  Tests to check that your symptoms are not due to another condition such as:  Strep throat.  Sinusitis.  Pneumonia.  Asthma. TREATMENT  A URI goes away on its own with time. It cannot be cured with medicines, but medicines may be prescribed or recommended to relieve symptoms. Medicines may help:  Reduce your fever.  Reduce your cough.  Relieve nasal congestion. HOME CARE INSTRUCTIONS   Take medicines only as directed by your health care provider.   Gargle warm saltwater or take cough drops to comfort your throat as directed by your health care provider.  Use a warm mist humidifier or inhale steam from a shower to increase air moisture. This may make it easier to breathe.  Drink enough fluid to keep your urine clear or pale yellow.   Eat soups and other clear broths and maintain good nutrition.   Rest as needed.   Return to work when your temperature has returned to normal or as your health care provider advises. You may need to stay home longer to avoid infecting others. You can also use a face mask and careful hand washing to prevent spread of the virus.  Increase the usage of your inhaler if  you have asthma.   Do not use any tobacco products, including cigarettes, chewing tobacco, or electronic cigarettes. If you need help quitting, ask your health care provider. PREVENTION  The best way to protect yourself from getting a cold is to practice good hygiene.   Avoid oral or hand contact with people with cold symptoms.   Wash your hands often if contact occurs.  There is no clear evidence that vitamin C, vitamin E, echinacea, or exercise reduces the chance of developing a cold. However, it is always recommended to get plenty of rest, exercise, and practice good nutrition.  SEEK MEDICAL CARE IF:   You are getting worse rather than better.   Your symptoms are not controlled by medicine.   You have chills.  You have worsening shortness of breath.  You have brown or red mucus.  You have yellow or brown nasal discharge.  You have pain in your face, especially when you bend forward.  You have a fever.  You have swollen neck glands.  You have pain while swallowing.  You have white areas in the back of your throat. SEEK IMMEDIATE MEDICAL CARE IF:   You have severe or persistent:  Headache.  Ear pain.  Sinus pain.  Chest pain.  You have chronic lung disease and any of the following:  Wheezing.  Prolonged cough.  Coughing up blood.  A change in your usual mucus.  You have a stiff neck.  You have changes in your:  Vision.  Hearing.  Thinking.  Mood. MAKE SURE YOU:   Understand these instructions.  Will watch your condition.  Will get help right away if you are not doing well or get worse.   This information is not intended to replace advice given to you by your health care provider. Make sure you discuss any questions you have with your health care provider.   Document Released: 11/17/2000 Document Revised: 10/08/2014 Document Reviewed: 08/29/2013 Elsevier Interactive Patient Education [employer]

## 2015-06-23 NOTE — ED Provider Notes (Signed)
[facility] Emergency Department Provider Note  ____________________________________________  Time seen: Approximately 7:01 PM  I have reviewed the triage vital signs and the nursing notes.   HISTORY  Chief Complaint Nasal Congestion and Otalgia    HPI Yvonne Rios is a 25 y.o. female who presents to emergency department complaining of nasal congestion, sinus pressure, bilateral ear pain, cough, and wheezing. She states that symptoms began with nasal congestion 2-3 weeks ago. Symptoms have persisted and began to include other symptoms. She states that her pain has been ongoing for "a couple days." Patient states cough has been present for a week to 10 days. Patient endorses taking over-the-counter medications for symptom relief but states that they're no longer working for her. Patient denies any headache, visual acuity changes, neck pain, chest pain, shortness of breath, abdominal pain, nausea or vomiting, diarrhea or constipation.   Past Medical History  Diagnosis Date  . MRSA (methicillin resistant staph aureus) culture positive   . Asthma   . Migraine   . Pyloric stenosis     There are no active problems to display for this patient.   History reviewed. No pertinent past surgical history.  Current Outpatient Rx  Name  Route  Sig  Dispense  Refill  . ADDERALL XR 20 MG 24 hr capsule   Oral   Take 1 capsule by mouth 2 (two) times daily.      0     Dispense as written.   Marland Kitchen albuterol (PROVENTIL HFA;VENTOLIN HFA) 108 (90 Base) MCG/ACT inhaler   Inhalation   Inhale 2 puffs into the lungs every 4 (four) hours as needed for wheezing or shortness of breath.   1 Inhaler   0   . AMITIZA 8 MCG capsule   Oral   Take 1 capsule by mouth 2 (two) times daily.      0     Dispense as written.   . baclofen (LIORESAL) 10 MG tablet   Oral   Take 1-2 tablets by mouth at bedtime.      0   . beclomethasone (QVAR) 80 MCG/ACT inhaler    Inhalation   Inhale 2 puffs into the lungs 2 (two) times daily.         . benzonatate (TESSALON PERLES) 100 MG capsule   Oral   Take 1 capsule (100 mg total) by mouth every 6 (six) hours as needed for cough.   15 capsule   0   . cefdinir (OMNICEF) 300 MG capsule   Oral   Take 1 capsule (300 mg total) by mouth 2 (two) times daily.   20 capsule   0   . cetirizine (ZYRTEC) 10 MG tablet   Oral   Take 1 tablet (10 mg total) by mouth daily.   30 tablet   0   . clonazePAM (KLONOPIN) 1 MG tablet   Oral   Take 1 mg by mouth 3 (three) times daily as needed for anxiety.         . diazepam (VALIUM) 10 MG tablet   Oral   Take 10 mg by mouth daily.         Marland Kitchen doxycycline (VIBRA-TABS) 100 MG tablet   Oral   Take 1 tablet (100 mg total) by mouth 2 (two) times daily.   14 tablet   0   . fluticasone (FLONASE) 50 MCG/ACT nasal spray   Each Nare   Place 1 spray into both nostrils 2 (two) times daily.   16  g   0   . gabapentin (NEURONTIN) 300 MG capsule   Oral   Take 1 capsule by mouth 2 (two) times daily.      0   . ibuprofen (ADVIL,MOTRIN) 800 MG tablet   Oral   Take 1 tablet (800 mg total) by mouth every 8 (eight) hours as needed.   15 tablet   0   . ketorolac (ACULAR) 0.4 % SOLN   Both Eyes   Place 1 drop into both eyes daily.      0   . lamoTRIgine (LAMICTAL) 200 MG tablet   Oral   Take 200 mg by mouth 2 (two) times daily.         Marland Kitchen. loratadine (CLARITIN) 10 MG tablet   Oral   Take 1 tablet by mouth daily.      1   . metoprolol (TOPROL-XL) 200 MG 24 hr tablet   Oral   Take 200 mg by mouth daily.         . montelukast (SINGULAIR) 10 MG tablet   Oral   Take 10 mg by mouth at bedtime.         . nabumetone (RELAFEN) 750 MG tablet   Oral   Take 1 tablet by mouth 2 (two) times daily.      0   . ondansetron (ZOFRAN ODT) 4 MG disintegrating tablet   Oral   Take 1 tablet (4 mg total) by mouth every 6 (six) hours as needed for nausea or vomiting.    20 tablet   0   . predniSONE (DELTASONE) 10 MG tablet   Oral   Take 1 tablet (10 mg total) by mouth as directed.   21 tablet   0     Take on a daily basis of 6, 5, 4, 3, 2, 1   . rizatriptan (MAXALT) 10 MG tablet   Oral   Take 1 tablet by mouth every 2 (two) hours as needed.      0   . sertraline (ZOLOFT) 100 MG tablet   Oral   Take 100 mg by mouth daily.         Marland Kitchen. sulfamethoxazole-trimethoprim (BACTRIM DS,SEPTRA DS) 800-160 MG tablet   Oral   Take 1 tablet by mouth 2 (two) times daily.   20 tablet   0   . topiramate (TOPAMAX) 100 MG tablet   Oral   Take 100 mg by mouth at bedtime.      0     Allergies Cefprozil; Amoxil; and Augmentin  History reviewed. No pertinent family history.  Social History Social History  Substance Use Topics  . Smoking status: Never Smoker   . Smokeless tobacco: None  . Alcohol Use: No     Comment: rarely     Review of Systems  Constitutional: No fever/chills Eyes: No visual changes. No discharge ENT: No sore throat. Endorses nasal congestion and sinus pressure. Endorses bilateral ear pain. Cardiovascular: no chest pain. Respiratory: Positive for dry cough. No SOB. Gastrointestinal: No abdominal pain.  No nausea, no vomiting.  No diarrhea.  No constipation. Genitourinary: Negative for dysuria. No hematuria Musculoskeletal: Negative for back pain. Skin: Negative for rash. Neurological: Negative for headaches, focal weakness or numbness. 10-point ROS otherwise negative.  ____________________________________________   PHYSICAL EXAM:  VITAL SIGNS: ED Triage Vitals  Enc Vitals Group     BP 06/23/15 1829 133/57 mmHg     Pulse Rate 06/23/15 1829 72     Resp 06/23/15 1829 18  Temp 06/23/15 1829 98.8 F (37.1 C)     Temp Source 06/23/15 1829 Oral     SpO2 06/23/15 1829 95 %     Weight 06/23/15 1829 330 lb (149.687 kg)     Height 06/23/15 1829 5\' 8"  (1.727 m)     Head Cir --      Peak Flow --      Pain Score  06/23/15 1829 8     Pain Loc --      Pain Edu? --      Excl. in GC? --      Constitutional: Alert and oriented. Well appearing and in no acute distress. Eyes: Conjunctivae are normal. PERRL. EOMI. Head: Atraumatic. ENT:      Ears: EACs are unremarkable bilaterally. TMs are visualized bilaterally. TM on the right is dusky in appearance, moderately bulging, with mucoid air-fluid level present. TM on left is of normal color, moderately bulging, no air fluid level.      Nose: Moderate purulent congestion/rhinnorhea. Tender to percussion bilateral maxillary sinuses.      Mouth/Throat: Mucous membranes are moist.  Neck: No stridor.   Hematological/Lymphatic/Immunilogical: Diffuse, mobile, nontender anterior cervical lymphadenopathy. Cardiovascular: Normal rate, regular rhythm. Normal S1 and S2.  Good peripheral circulation. Respiratory: Normal respiratory effort without tachypnea or retractions. Lungs diffuse expiratory wheezing upper and lower lung fields bilaterally. No rales or rhonchi. No decreased or absent breath sounds. Gastrointestinal: Soft and nontender. No distention. No CVA tenderness. Musculoskeletal: No lower extremity tenderness nor edema.  No joint effusions. Neurologic:  Normal speech and language. No gross focal neurologic deficits are appreciated.  Skin:  Skin is warm, dry and intact. No rash noted. Psychiatric: Mood and affect are normal. Speech and behavior are normal. Patient exhibits appropriate insight and judgement.   ____________________________________________   LABS (all labs ordered are listed, but only abnormal results are displayed)  Labs Reviewed - No data to display ____________________________________________  EKG   ____________________________________________  RADIOLOGY   No results found.  ____________________________________________    PROCEDURES  Procedure(s) performed:       Medications  albuterol (PROVENTIL) (2.5 MG/3ML) 0.083%  nebulizer solution 2.5 mg (2.5 mg Nebulization Given 06/23/15 1938)     ____________________________________________   INITIAL IMPRESSION / ASSESSMENT AND PLAN / ED COURSE  Pertinent labs & imaging results that were available during my care of the patient were reviewed by me and considered in my medical decision making (see chart for details).  Patient's diagnosis is consistent with bacterial sinusitis, right-sided otitis media, and bronchitis. Patient did have some wheezing in the emergency department and was given albuterol with good symptom relief. Patient will be discharged home with prescriptions for antibiotic's, nasal spray, steroids, albuterol. Patient is to follow up with primary care provider if symptoms persist past this treatment course. Patient is given ED precautions to return to the ED for any worsening or new symptoms.     ____________________________________________  FINAL CLINICAL IMPRESSION(S) / ED DIAGNOSES  Final diagnoses:  Acute maxillary sinusitis, recurrence not specified  Acute mucoid otitis media of right ear  Bronchitis      NEW MEDICATIONS STARTED DURING THIS VISIT:  New Prescriptions   ALBUTEROL (PROVENTIL HFA;VENTOLIN HFA) 108 (90 BASE) MCG/ACT INHALER    Inhale 2 puffs into the lungs every 4 (four) hours as needed for wheezing or shortness of breath.   CETIRIZINE (ZYRTEC) 10 MG TABLET    Take 1 tablet (10 mg total) by mouth daily.   DOXYCYCLINE (VIBRA-TABS) 100 MG  TABLET    Take 1 tablet (100 mg total) by mouth 2 (two) times daily.   FLUTICASONE (FLONASE) 50 MCG/ACT NASAL SPRAY    Place 1 spray into both nostrils 2 (two) times daily.   PREDNISONE (DELTASONE) 10 MG TABLET    Take 1 tablet (10 mg total) by mouth as directed.        Delorise Royals Cuthriell, PA-C 06/23/15 2045  Phineas Semen, MD 06/23/15 2114

## 2015-06-23 NOTE — ED Notes (Signed)

## 2015-06-23 NOTE — ED Notes (Signed)
Pt states nasal congestion and bilateral ear pain

## 2015-06-30 ENCOUNTER — Encounter: Payer: Self-pay | Admitting: *Deleted

## 2015-06-30 DIAGNOSIS — Z7952 Long term (current) use of systemic steroids: Secondary | ICD-10-CM | POA: Insufficient documentation

## 2015-06-30 DIAGNOSIS — J45901 Unspecified asthma with (acute) exacerbation: Secondary | ICD-10-CM | POA: Insufficient documentation

## 2015-06-30 DIAGNOSIS — Z792 Long term (current) use of antibiotics: Secondary | ICD-10-CM | POA: Diagnosis not present

## 2015-06-30 DIAGNOSIS — Z79899 Other long term (current) drug therapy: Secondary | ICD-10-CM | POA: Insufficient documentation

## 2015-06-30 DIAGNOSIS — J029 Acute pharyngitis, unspecified: Secondary | ICD-10-CM | POA: Diagnosis present

## 2015-06-30 DIAGNOSIS — Z7951 Long term (current) use of inhaled steroids: Secondary | ICD-10-CM | POA: Insufficient documentation

## 2015-06-30 LAB — POCT RAPID STREP A: Streptococcus, Group A Screen (Direct): NEGATIVE

## 2015-06-30 NOTE — ED Notes (Signed)
Pt reports sore throat, cough and sinus congestion.  Treated for bronchitis and sinus infection last week.  Nonsmoker.

## 2015-07-01 ENCOUNTER — Emergency Department
Admission: EM | Admit: 2015-07-01 | Discharge: 2015-07-01 | Disposition: A | Discharge status: Home / Self Care | Payer: Medicaid Other | Attending: Emergency Medicine | Admitting: Emergency Medicine

## 2015-07-01 ENCOUNTER — Emergency Department: Payer: Medicaid Other

## 2015-07-01 DIAGNOSIS — J029 Acute pharyngitis, unspecified: Secondary | ICD-10-CM

## 2015-07-01 DIAGNOSIS — J40 Bronchitis, not specified as acute or chronic: Secondary | ICD-10-CM

## 2015-07-01 MED ORDER — KETOROLAC TROMETHAMINE 60 MG/2ML IM SOLN
60.0000 mg | Freq: Once | INTRAMUSCULAR | Status: AC
  Administered 2015-07-01: 60 mg via INTRAMUSCULAR
  Filled 2015-07-01: qty 2

## 2015-07-01 MED ORDER — LEVOFLOXACIN 500 MG PO TABS
500.0000 mg | ORAL_TABLET | Freq: Once | ORAL | Status: AC
  Administered 2015-07-01: 500 mg via ORAL
  Filled 2015-07-01: qty 1

## 2015-07-01 MED ORDER — HYDROCOD POLST-CPM POLST ER 10-8 MG/5ML PO SUER: 5.0000 mL | Freq: Two times a day (BID) | ORAL | Status: DC

## 2015-07-01 MED ORDER — IPRATROPIUM-ALBUTEROL 0.5-2.5 (3) MG/3ML IN SOLN
3.0000 mL | Freq: Once | RESPIRATORY_TRACT | Status: AC
  Administered 2015-07-01: 3 mL via RESPIRATORY_TRACT
  Filled 2015-07-01: qty 3

## 2015-07-01 MED ORDER — LEVOFLOXACIN 500 MG PO TABS: 500.0000 mg | ORAL_TABLET | Freq: Every day | ORAL | Status: AC

## 2015-07-01 NOTE — ED Notes (Signed)
Pt alert and oriented X4, active, cooperative, pt in NAD. RR even and unlabored, color WNL.  Pt informed to return if any life threatening symptoms occur.   

## 2015-07-01 NOTE — ED Notes (Signed)
Patient transported to X-ray 

## 2015-07-01 NOTE — ED Notes (Signed)
Pt c/o sore throat, sob, chest pain and cough x 2 weeks. Pt was seen in ED 1 week ago, prescribed doxycycline and prednisone, pt reports she finished medication and feels her symptoms have worsened.

## 2015-07-01 NOTE — ED Provider Notes (Signed)
[facility] Emergency Department Provider Note  ____________________________________________  Time seen: Approximately 200 AM  I have reviewed the triage vital signs and the nursing notes.   HISTORY  Chief Complaint Sore Throat    HPI Yvonne Rios is a 25 y.o. female who comes into the hospital today with sore throat cough and spitting up mucus. She reports that she has some chest tightness and difficulty breathing. According to the patient's husband she was coughing and noticed some blood in her sputum. The patient was here last week and was told that she had bronchitis, and ear infection and a sinus infection. The patient was given doxycycline and prednisone but did not follow up with her primary care physician. When she continued to have symptoms tonight she called her doctor and was told to come in for blood work and x-rays. She reports that she is completing her medication but she still had fevers at home to 101. The patient incurred to some nausea and headache and has been using her nebulizer and inhaler at home for her asthma. The patient reports that she still feels unwell so she decided to come in to get checked out.   Past Medical History  Diagnosis Date  . MRSA (methicillin resistant staph aureus) culture positive   . Asthma   . Migraine   . Pyloric stenosis     There are no active problems to display for this patient.   No past surgical history  Current Outpatient Rx  Name  Route  Sig  Dispense  Refill  . ADDERALL XR 20 MG 24 hr capsule   Oral   Take 1 capsule by mouth 2 (two) times daily.      0     Dispense as written.   Marland Kitchen albuterol (PROVENTIL HFA;VENTOLIN HFA) 108 (90 Base) MCG/ACT inhaler   Inhalation   Inhale 2 puffs into the lungs every 4 (four) hours as needed for wheezing or shortness of breath.   1 Inhaler   0   . AMITIZA 8 MCG capsule   Oral   Take 1 capsule by mouth 2 (two) times daily.      0     Dispense as  written.   . baclofen (LIORESAL) 10 MG tablet   Oral   Take 1-2 tablets by mouth at bedtime.      0   . beclomethasone (QVAR) 80 MCG/ACT inhaler   Inhalation   Inhale 2 puffs into the lungs 2 (two) times daily.         . benzonatate (TESSALON PERLES) 100 MG capsule   Oral   Take 1 capsule (100 mg total) by mouth every 6 (six) hours as needed for cough.   15 capsule   0   . cefdinir (OMNICEF) 300 MG capsule   Oral   Take 1 capsule (300 mg total) by mouth 2 (two) times daily.   20 capsule   0   . cetirizine (ZYRTEC) 10 MG tablet   Oral   Take 1 tablet (10 mg total) by mouth daily.   30 tablet   0   . chlorpheniramine-HYDROcodone (TUSSIONEX PENNKINETIC ER) 10-8 MG/5ML SUER   Oral   Take 5 mLs by mouth 2 (two) times daily.   100 mL   0   . clonazePAM (KLONOPIN) 1 MG tablet   Oral   Take 1 mg by mouth 3 (three) times daily as needed for anxiety.         . diazepam (  VALIUM) 10 MG tablet   Oral   Take 10 mg by mouth daily.         Marland Kitchen doxycycline (VIBRA-TABS) 100 MG tablet   Oral   Take 1 tablet (100 mg total) by mouth 2 (two) times daily.   14 tablet   0   . fluticasone (FLONASE) 50 MCG/ACT nasal spray   Each Nare   Place 1 spray into both nostrils 2 (two) times daily.   16 g   0   . gabapentin (NEURONTIN) 300 MG capsule   Oral   Take 1 capsule by mouth 2 (two) times daily.      0   . ibuprofen (ADVIL,MOTRIN) 800 MG tablet   Oral   Take 1 tablet (800 mg total) by mouth every 8 (eight) hours as needed.   15 tablet   0   . ketorolac (ACULAR) 0.4 % SOLN   Both Eyes   Place 1 drop into both eyes daily.      0   . lamoTRIgine (LAMICTAL) 200 MG tablet   Oral   Take 200 mg by mouth 2 (two) times daily.         Marland Kitchen levofloxacin (LEVAQUIN) 500 MG tablet   Oral   Take 1 tablet (500 mg total) by mouth daily.   7 tablet   0   . loratadine (CLARITIN) 10 MG tablet   Oral   Take 1 tablet by mouth daily.      1   . metoprolol (TOPROL-XL) 200 MG  24 hr tablet   Oral   Take 200 mg by mouth daily.         . montelukast (SINGULAIR) 10 MG tablet   Oral   Take 10 mg by mouth at bedtime.         . nabumetone (RELAFEN) 750 MG tablet   Oral   Take 1 tablet by mouth 2 (two) times daily.      0   . ondansetron (ZOFRAN ODT) 4 MG disintegrating tablet   Oral   Take 1 tablet (4 mg total) by mouth every 6 (six) hours as needed for nausea or vomiting.   20 tablet   0   . predniSONE (DELTASONE) 10 MG tablet   Oral   Take 1 tablet (10 mg total) by mouth as directed.   21 tablet   0     Take on a daily basis of 6, 5, 4, 3, 2, 1   . rizatriptan (MAXALT) 10 MG tablet   Oral   Take 1 tablet by mouth every 2 (two) hours as needed.      0   . sertraline (ZOLOFT) 100 MG tablet   Oral   Take 100 mg by mouth daily.         Marland Kitchen sulfamethoxazole-trimethoprim (BACTRIM DS,SEPTRA DS) 800-160 MG tablet   Oral   Take 1 tablet by mouth 2 (two) times daily.   20 tablet   0   . topiramate (TOPAMAX) 100 MG tablet   Oral   Take 100 mg by mouth at bedtime.      0     Allergies Cefprozil; Amoxil; and Augmentin  No family history on file.  Social History Social History  Substance Use Topics  . Smoking status: Never Smoker   . Smokeless tobacco: None  . Alcohol Use: No     Comment: rarely    Review of Systems Constitutional:  fever/chills Eyes: No visual changes. ENT:  sore throat.  Cardiovascular: chest pain. Respiratory: Cough and shortness of breath. Gastrointestinal: Nausea with No abdominal pain.  no vomiting.  No diarrhea.  No constipation. Genitourinary: Negative for dysuria. Musculoskeletal: Negative for back pain. Skin: Negative for rash. Neurological: Negative for headaches, focal weakness or numbness.  10-point ROS otherwise negative.  ____________________________________________   PHYSICAL EXAM:  VITAL SIGNS: ED Triage Vitals  Enc Vitals Group     BP 06/30/15 2340 131/70 mmHg     Pulse Rate  06/30/15 2340 93     Resp 06/30/15 2340 20     Temp 06/30/15 2340 100.7 F (38.2 C)     Temp Source 06/30/15 2340 Oral     SpO2 06/30/15 2340 98 %     Weight 06/30/15 2340 330 lb (149.687 kg)     Height 06/30/15 2340  (1.702 m)     Head Cir --      Peak Flow --      Pain Score 06/30/15 2342 8     Pain Loc --      Pain Edu? --      Excl. in GC? --     Constitutional: Alert and oriented. Well appearing and in mild distress. Eyes: Conjunctivae are normal. PERRL. EOMI. Head: Atraumatic. Nose: No congestion/rhinnorhea. Mouth/Throat: Mucous membranes are moist.  Oropharynx non-erythematous. Cardiovascular: Normal rate, regular rhythm. Grossly normal heart sounds.  Good peripheral circulation. Respiratory: Normal respiratory effort.  No retractions. With mild expiratory wheezes and tenderness to palpation of the patient's chest wall. Gastrointestinal: Soft and nontender. No distention. Positive bowel sounds Musculoskeletal: No lower extremity tenderness nor edema.   Neurologic:  Normal speech and language.  Skin:  Skin is warm, dry and intact.  Psychiatric: Mood and affect are normal.   ____________________________________________   LABS (all labs ordered are listed, but only abnormal results are displayed)  Labs Reviewed  POCT RAPID STREP A   ____________________________________________  EKG  none ____________________________________________  RADIOLOGY  Chest x-ray: No active cardiopulmonary disease ____________________________________________   PROCEDURES  Procedure(s) performed: None  Critical Care performed: No  ____________________________________________   INITIAL IMPRESSION / ASSESSMENT AND PLAN / ED COURSE  Pertinent labs & imaging results that were available during my care of the patient were reviewed by me and considered in my medical decision making (see chart for details).  The patient is a 25 year old female who comes in with some cough and  sore throat with coughing up mucus. It seems as though the patient has continued bronchitis that she does have some wheezing. I did the patient a DuoNeb and decided to give her some level floxacillin given her symptoms. She is negative and she does not have pneumonia on x-ray. I do not feel the patient needs blood work at this time as she does not have any abnormal vital signs and her fever is improved. The patient also received a dose of Toradol. The patient will be discharged to home to follow-up with her primary care physician. The patient and her family had no further questions at this time. ____________________________________________   FINAL CLINICAL IMPRESSION(S) / ED DIAGNOSES  Final diagnoses:  Bronchitis  Pharyngitis      Rebecka Apley, MD 07/01/15 [phone]

## 2015-07-01 NOTE — Discharge Instructions (Signed)
Pharyngitis °Pharyngitis is redness, pain, and swelling (inflammation) of your pharynx.  °CAUSES  °Pharyngitis is usually caused by infection. Most of the time, these infections are from viruses (viral) and are part of a cold. However, sometimes pharyngitis is caused by bacteria (bacterial). Pharyngitis can also be caused by allergies. Viral pharyngitis may be spread from person to person by coughing, sneezing, and personal items or utensils (cups, forks, spoons, toothbrushes). Bacterial pharyngitis may be spread from person to person by more intimate contact, such as kissing.  °SIGNS AND SYMPTOMS  °Symptoms of pharyngitis include:   °· Sore throat.   °· Tiredness (fatigue).   °· Low-grade fever.   °· Headache. °· Joint pain and muscle aches. °· Skin rashes. °· Swollen lymph nodes. °· Plaque-like film on throat or tonsils (often seen with bacterial pharyngitis). °DIAGNOSIS  °Your health care provider will ask you questions about your illness and your symptoms. Your medical history, along with a physical exam, is often all that is needed to diagnose pharyngitis. Sometimes, a rapid strep test is done. Other lab tests may also be done, depending on the suspected cause.  °TREATMENT  °Viral pharyngitis will usually get better in 3-4 days without the use of medicine. Bacterial pharyngitis is treated with medicines that kill germs (antibiotics).  °HOME CARE INSTRUCTIONS  °· Drink enough water and fluids to keep your urine clear or pale yellow.   °· Only take over-the-counter or prescription medicines as directed by your health care provider:   °· If you are prescribed antibiotics, make sure you finish them even if you start to feel better.   °· Do not take aspirin.   °· Get lots of rest.   °· Gargle with 8 oz of salt water (½ tsp of salt per 1 qt of water) as often as every 1-2 hours to soothe your throat.   °· Throat lozenges (if you are not at risk for choking) or sprays may be used to soothe your throat. °SEEK MEDICAL  CARE IF:  °· You have large, tender lumps in your neck. °· You have a rash. °· You cough up green, yellow-brown, or bloody spit. °SEEK IMMEDIATE MEDICAL CARE IF:  °· Your neck becomes stiff. °· You drool or are unable to swallow liquids. °· You vomit or are unable to keep medicines or liquids down. °· You have severe pain that does not go away with the use of recommended medicines. °· You have trouble breathing (not caused by a stuffy nose). °MAKE SURE YOU:  °· Understand these instructions. °· Will watch your condition. °· Will get help right away if you are not doing well or get worse. °  °This information is not intended to replace advice given to you by your health care provider. Make sure you discuss any questions you have with your health care provider. °  °Document Released: 05/24/2005 Document Revised: 03/14/2013 Document Reviewed: 01/29/2013 °Elsevier Interactive Patient Education ©2016 [employer] Inc[employer] °Upper Respiratory Infection, Adult °Most upper respiratory infections (URIs) are a viral infection of the air passages leading to the lungs. A URI affects the nose, throat, and upper air passages. The most common type of URI is nasopharyngitis and is typically referred to as "the common cold." °URIs run their course and usually go away on their own. Most of the time, a URI does not require medical attention, but sometimes a bacterial infection in the upper airways can follow a viral infection. This is called a secondary infection. Sinus and middle ear infections are common types of secondary upper respiratory infections. °Bacterial pneumonia   can also complicate a URI. A URI can worsen asthma and chronic obstructive pulmonary disease (COPD). Sometimes, these complications can require emergency medical care and may be life threatening.  °CAUSES °Almost all URIs are caused by viruses. A virus is a type of germ and can spread from one person to another.  °RISKS FACTORS °You may be at risk for a URI if:  °· You  smoke.   °· You have chronic heart or lung disease. °· You have a weakened defense (immune) system.   °· You are very young or very old.   °· You have nasal allergies or asthma. °· You work in crowded or poorly ventilated areas. °· You work in health care facilities or schools. °SIGNS AND SYMPTOMS  °Symptoms typically develop 2-3 days after you come in contact with a cold virus. Most viral URIs last 7-10 days. However, viral URIs from the influenza virus (flu virus) can last 14-18 days and are typically more severe. Symptoms may include:  °· Runny or stuffy (congested) nose.   °· Sneezing.   °· Cough.   °· Sore throat.   °· Headache.   °· Fatigue.   °· Fever.   °· Loss of appetite.   °· Pain in your forehead, behind your eyes, and over your cheekbones (sinus pain). °· Muscle aches.   °DIAGNOSIS  °Your health care provider may diagnose a URI by: °· Physical exam. °· Tests to check that your symptoms are not due to another condition such as: °¨ Strep throat. °¨ Sinusitis. °¨ Pneumonia. °¨ Asthma. °TREATMENT  °A URI goes away on its own with time. It cannot be cured with medicines, but medicines may be prescribed or recommended to relieve symptoms. Medicines may help: °· Reduce your fever. °· Reduce your cough. °· Relieve nasal congestion. °HOME CARE INSTRUCTIONS  °· Take medicines only as directed by your health care provider.   °· Gargle warm saltwater or take cough drops to comfort your throat as directed by your health care provider. °· Use a warm mist humidifier or inhale steam from a shower to increase air moisture. This may make it easier to breathe. °· Drink enough fluid to keep your urine clear or pale yellow.   °· Eat soups and other clear broths and maintain good nutrition.   °· Rest as needed.   °· Return to work when your temperature has returned to normal or as your health care provider advises. You may need to stay home longer to avoid infecting others. You can also use a face mask and careful hand  washing to prevent spread of the virus. °· Increase the usage of your inhaler if you have asthma.   °· Do not use any tobacco products, including cigarettes, chewing tobacco, or electronic cigarettes. If you need help quitting, ask your health care provider. °PREVENTION  °The best way to protect yourself from getting a cold is to practice good hygiene.  °· Avoid oral or hand contact with people with cold symptoms.   °· Wash your hands often if contact occurs.   °There is no clear evidence that vitamin C, vitamin E, echinacea, or exercise reduces the chance of developing a cold. However, it is always recommended to get plenty of rest, exercise, and practice good nutrition.  °SEEK MEDICAL CARE IF:  °· You are getting worse rather than better.   °· Your symptoms are not controlled by medicine.   °· You have chills. °· You have worsening shortness of breath. °· You have brown or red mucus. °· You have yellow or brown nasal discharge. °· You have pain in your face, especially   when you bend forward. °· You have a fever. °· You have swollen neck glands. °· You have pain while swallowing. °· You have white areas in the back of your throat. °SEEK IMMEDIATE MEDICAL CARE IF:  °· You have severe or persistent: °¨ Headache. °¨ Ear pain. °¨ Sinus pain. °¨ Chest pain. °· You have chronic lung disease and any of the following: °¨ Wheezing. °¨ Prolonged cough. °¨ Coughing up blood. °¨ A change in your usual mucus. °· You have a stiff neck. °· You have changes in your: °¨ Vision. °¨ Hearing. °¨ Thinking. °¨ Mood. °MAKE SURE YOU:  °· Understand these instructions. °· Will watch your condition. °· Will get help right away if you are not doing well or get worse. °  °This information is not intended to replace advice given to you by your health care provider. Make sure you discuss any questions you have with your health care provider. °  °Document Released: 11/17/2000 Document Revised: 10/08/2014 Document Reviewed: 08/29/2013 °Elsevier  Interactive Patient Education ©2016 [employer] ° °

## 2015-07-31 ENCOUNTER — Encounter: Payer: Self-pay | Admitting: Emergency Medicine

## 2015-07-31 DIAGNOSIS — H9201 Otalgia, right ear: Secondary | ICD-10-CM | POA: Diagnosis present

## 2015-07-31 DIAGNOSIS — Z7952 Long term (current) use of systemic steroids: Secondary | ICD-10-CM | POA: Diagnosis not present

## 2015-07-31 DIAGNOSIS — G43809 Other migraine, not intractable, without status migrainosus: Secondary | ICD-10-CM | POA: Diagnosis not present

## 2015-07-31 DIAGNOSIS — Z792 Long term (current) use of antibiotics: Secondary | ICD-10-CM | POA: Diagnosis not present

## 2015-07-31 DIAGNOSIS — H66001 Acute suppurative otitis media without spontaneous rupture of ear drum, right ear: Secondary | ICD-10-CM | POA: Diagnosis not present

## 2015-07-31 DIAGNOSIS — Z7951 Long term (current) use of inhaled steroids: Secondary | ICD-10-CM | POA: Insufficient documentation

## 2015-07-31 DIAGNOSIS — Z791 Long term (current) use of non-steroidal anti-inflammatories (NSAID): Secondary | ICD-10-CM | POA: Diagnosis not present

## 2015-07-31 DIAGNOSIS — Z79899 Other long term (current) drug therapy: Secondary | ICD-10-CM | POA: Diagnosis not present

## 2015-07-31 DIAGNOSIS — Z880 Allergy status to penicillin: Secondary | ICD-10-CM | POA: Diagnosis not present

## 2015-07-31 NOTE — ED Notes (Signed)
Patient states that she woke up this morning with right ear pain and clear drainage. Patient states that she also has a headache.

## 2015-08-01 ENCOUNTER — Emergency Department
Admission: EM | Admit: 2015-08-01 | Discharge: 2015-08-01 | Disposition: A | Discharge status: Home / Self Care | Payer: Medicaid Other | Attending: Emergency Medicine | Admitting: Emergency Medicine

## 2015-08-01 DIAGNOSIS — H66001 Acute suppurative otitis media without spontaneous rupture of ear drum, right ear: Secondary | ICD-10-CM

## 2015-08-01 DIAGNOSIS — G43809 Other migraine, not intractable, without status migrainosus: Secondary | ICD-10-CM

## 2015-08-01 MED ORDER — BUTALBITAL-APAP-CAFFEINE 50-325-40 MG PO TABS
1.0000 | ORAL_TABLET | Freq: Once | ORAL | Status: AC
  Administered 2015-08-01: 1 via ORAL
  Filled 2015-08-01: qty 1

## 2015-08-01 MED ORDER — CLARITHROMYCIN 500 MG PO TABS: 500.0000 mg | ORAL_TABLET | Freq: Two times a day (BID) | ORAL | Status: AC

## 2015-08-01 MED ORDER — CLARITHROMYCIN 500 MG PO TABS
500.0000 mg | ORAL_TABLET | Freq: Once | ORAL | Status: AC
  Administered 2015-08-01: 500 mg via ORAL
  Filled 2015-08-01: qty 1

## 2015-08-01 NOTE — Discharge Instructions (Signed)
1. Take antibiotic as prescribed (clarithromycin 500 mg twice daily 7 days). 2. Return to the ER for worsening symptoms, persistent vomiting, fever, difficulty breathing or other concerns.  Otitis Media, Adult Otitis media is redness, soreness, and inflammation of the middle ear. Otitis media may be caused by allergies or, most commonly, by infection. Often it occurs as a complication of the common cold. SIGNS AND SYMPTOMS Symptoms of otitis media may include:  Earache.  Fever.  Ringing in your ear.  Headache.  Leakage of fluid from the ear. DIAGNOSIS To diagnose otitis media, your health care provider will examine your ear with an otoscope. This is an instrument that allows your health care provider to see into your ear in order to examine your eardrum. Your health care provider also will ask you questions about your symptoms. TREATMENT  Typically, otitis media resolves on its own within 3-5 days. Your health care provider may prescribe medicine to ease your symptoms of pain. If otitis media does not resolve within 5 days or is recurrent, your health care provider may prescribe antibiotic medicines if he or she suspects that a bacterial infection is the cause. HOME CARE INSTRUCTIONS   If you were prescribed an antibiotic medicine, finish it all even if you start to feel better.  Take medicines only as directed by your health care provider.  Keep all follow-up visits as directed by your health care provider. SEEK MEDICAL CARE IF:  You have otitis media only in one ear, or bleeding from your nose, or both.  You notice a lump on your neck.  You are not getting better in 3-5 days.  You feel worse instead of better. SEEK IMMEDIATE MEDICAL CARE IF:   You have pain that is not controlled with medicine.  You have swelling, redness, or pain around your ear or stiffness in your neck.  You notice that part of your face is paralyzed.  You notice that the bone behind your ear  (mastoid) is tender when you touch it. MAKE SURE YOU:   Understand these instructions.  Will watch your condition.  Will get help right away if you are not doing well or get worse.   This information is not intended to replace advice given to you by your health care provider. Make sure you discuss any questions you have with your health care provider.   Document Released: 02/27/2004 Document Revised: 06/14/2014 Document Reviewed: 12/19/2012 Elsevier Interactive Patient Education 2016 [employer]  Recurrent Migraine Headache A migraine headache is an intense, throbbing pain on one or both sides of your head. Recurrent migraines keep coming back. A migraine can last for 30 minutes to several hours. CAUSES  The exact cause of a migraine headache is not always known. However, a migraine may be caused when nerves in the brain become irritated and release chemicals that cause inflammation. This causes pain. Certain things may also trigger migraines, such as:   Alcohol.  Smoking.  Stress.  Menstruation.  Aged cheeses.  Foods or drinks that contain nitrates, glutamate, aspartame, or tyramine.  Lack of sleep.  Chocolate.  Caffeine.  Hunger.  Physical exertion.  Fatigue.  Medicines used to treat chest pain (nitroglycerine), birth control pills, estrogen, and some blood pressure medicines. SYMPTOMS   Pain on one or both sides of your head.  Pulsating or throbbing pain.  Severe pain that prevents daily activities.  Pain that is aggravated by any physical activity.  Nausea, vomiting, or both.  Dizziness.  Pain with exposure to  bright lights, loud noises, or activity.  General sensitivity to bright lights, loud noises, or smells. Before you get a migraine, you may get warning signs that a migraine is coming (aura). An aura may include:  Seeing flashing lights.  Seeing bright spots, halos, or zigzag lines.  Having tunnel vision or blurred vision.  Having  feelings of numbness or tingling.  Having trouble talking.  Having muscle weakness. DIAGNOSIS  A recurrent migraine headache is often diagnosed based on:  Symptoms.  Physical examination.  A CT scan or MRI of your head. These imaging tests cannot diagnose migraines but can help rule out other causes of headaches.  TREATMENT  Medicines may be given for pain and nausea. Medicines can also be given to help prevent recurrent migraines. HOME CARE INSTRUCTIONS  Only take over-the-counter or prescription medicines for pain or discomfort as directed by your health care provider. The use of long-term narcotics is not recommended.  Lie down in a dark, quiet room when you have a migraine.  Keep a journal to find out what may trigger your migraine headaches. For example, write down:  What you eat and drink.  How much sleep you get.  Any change to your diet or medicines.  Limit alcohol consumption.  Quit smoking if you smoke.  Get 7-9 hours of sleep, or as recommended by your health care provider.  Limit stress.  Keep lights dim if bright lights bother you and make your migraines worse. SEEK MEDICAL CARE IF:   You do not get relief from the medicines given to you.  You have a recurrence of pain.  You have a fever. SEEK IMMEDIATE MEDICAL CARE IF:  Your migraine becomes severe.  You have a stiff neck.  You have loss of vision.  You have muscular weakness or loss of muscle control.  You start losing your balance or have trouble walking.  You feel faint or pass out.  You have severe symptoms that are different from your first symptoms. MAKE SURE YOU:   Understand these instructions.  Will watch your condition.  Will get help right away if you are not doing well or get worse.   This information is not intended to replace advice given to you by your health care provider. Make sure you discuss any questions you have with your health care provider.   Document  Released: 02/16/2001 Document Revised: 06/14/2014 Document Reviewed: 01/29/2013 Elsevier Interactive Patient Education [employer]

## 2015-08-01 NOTE — ED Provider Notes (Signed)
[facility] Emergency Department Provider Note  ____________________________________________  Time seen: Approximately 12:58 AM  I have reviewed the triage vital signs and the nursing notes.   HISTORY  Chief Complaint Headache and Otalgia    HPI Yvonne Rios is a 25 y.o. female who presents to the ED from home with a chief complaint of right ear pain. Reports onset of symptoms 2 days, nontraumatic. Describes sharp pain to right ear with occasional clear drainage. Denies barotrauma or swimming. Notes that she has had bronchitis type symptoms off and on for the past 3-4 months and has been on several rounds of Z-Pak. Denies associated fever, chills, chest pain, shortness of breath, abdominal pain, nausea, vomiting, diarrhea. Patient reports a history of migraines and states she has had a dull frontal headache for the past several days.Nothing makes her symptoms better or worse.   Past Medical History  Diagnosis Date  . MRSA (methicillin resistant staph aureus) culture positive   . Asthma   . Migraine   . Pyloric stenosis     There are no active problems to display for this patient.   History reviewed. No pertinent past surgical history.  Current Outpatient Rx  Name  Route  Sig  Dispense  Refill  . ADDERALL XR 20 MG 24 hr capsule   Oral   Take 1 capsule by mouth 2 (two) times daily.      0     Dispense as written.   Marland Kitchen albuterol (PROVENTIL HFA;VENTOLIN HFA) 108 (90 Base) MCG/ACT inhaler   Inhalation   Inhale 2 puffs into the lungs every 4 (four) hours as needed for wheezing or shortness of breath.   1 Inhaler   0   . AMITIZA 8 MCG capsule   Oral   Take 1 capsule by mouth 2 (two) times daily.      0     Dispense as written.   . baclofen (LIORESAL) 10 MG tablet   Oral   Take 1-2 tablets by mouth at bedtime.      0   . beclomethasone (QVAR) 80 MCG/ACT inhaler   Inhalation   Inhale 2 puffs into the lungs 2 (two) times daily.          . benzonatate (TESSALON PERLES) 100 MG capsule   Oral   Take 1 capsule (100 mg total) by mouth every 6 (six) hours as needed for cough.   15 capsule   0   . cefdinir (OMNICEF) 300 MG capsule   Oral   Take 1 capsule (300 mg total) by mouth 2 (two) times daily.   20 capsule   0   . cetirizine (ZYRTEC) 10 MG tablet   Oral   Take 1 tablet (10 mg total) by mouth daily.   30 tablet   0   . chlorpheniramine-HYDROcodone (TUSSIONEX PENNKINETIC ER) 10-8 MG/5ML SUER   Oral   Take 5 mLs by mouth 2 (two) times daily.   100 mL   0   . clonazePAM (KLONOPIN) 1 MG tablet   Oral   Take 1 mg by mouth 3 (three) times daily as needed for anxiety.         . diazepam (VALIUM) 10 MG tablet   Oral   Take 10 mg by mouth daily.         Marland Kitchen doxycycline (VIBRA-TABS) 100 MG tablet   Oral   Take 1 tablet (100 mg total) by mouth 2 (two) times daily.   14 tablet  0   . fluticasone (FLONASE) 50 MCG/ACT nasal spray   Each Nare   Place 1 spray into both nostrils 2 (two) times daily.   16 g   0   . gabapentin (NEURONTIN) 300 MG capsule   Oral   Take 1 capsule by mouth 2 (two) times daily.      0   . ibuprofen (ADVIL,MOTRIN) 800 MG tablet   Oral   Take 1 tablet (800 mg total) by mouth every 8 (eight) hours as needed.   15 tablet   0   . ketorolac (ACULAR) 0.4 % SOLN   Both Eyes   Place 1 drop into both eyes daily.      0   . lamoTRIgine (LAMICTAL) 200 MG tablet   Oral   Take 200 mg by mouth 2 (two) times daily.         Marland Kitchen loratadine (CLARITIN) 10 MG tablet   Oral   Take 1 tablet by mouth daily.      1   . metoprolol (TOPROL-XL) 200 MG 24 hr tablet   Oral   Take 200 mg by mouth daily.         . montelukast (SINGULAIR) 10 MG tablet   Oral   Take 10 mg by mouth at bedtime.         . nabumetone (RELAFEN) 750 MG tablet   Oral   Take 1 tablet by mouth 2 (two) times daily.      0   . ondansetron (ZOFRAN ODT) 4 MG disintegrating tablet   Oral   Take 1 tablet  (4 mg total) by mouth every 6 (six) hours as needed for nausea or vomiting.   20 tablet   0   . predniSONE (DELTASONE) 10 MG tablet   Oral   Take 1 tablet (10 mg total) by mouth as directed.   21 tablet   0     Take on a daily basis of 6, 5, 4, 3, 2, 1   . rizatriptan (MAXALT) 10 MG tablet   Oral   Take 1 tablet by mouth every 2 (two) hours as needed.      0   . sertraline (ZOLOFT) 100 MG tablet   Oral   Take 100 mg by mouth daily.         Marland Kitchen sulfamethoxazole-trimethoprim (BACTRIM DS,SEPTRA DS) 800-160 MG tablet   Oral   Take 1 tablet by mouth 2 (two) times daily.   20 tablet   0   . topiramate (TOPAMAX) 100 MG tablet   Oral   Take 100 mg by mouth at bedtime.      0     Allergies Cefprozil; Amoxil; and Augmentin  No family history on file.  Social History Social History  Substance Use Topics  . Smoking status: Never Smoker   . Smokeless tobacco: None  . Alcohol Use: No     Comment: rarely    Review of Systems Constitutional: No fever/chills. Eyes: No visual changes. ENT: Positive for right ear pain. No sore throat. Cardiovascular: Denies chest pain. Respiratory: Denies shortness of breath. Gastrointestinal: No abdominal pain.  No nausea, no vomiting.  No diarrhea.  No constipation. Genitourinary: Negative for dysuria. Musculoskeletal: Negative for back pain. Skin: Negative for rash. Neurological: Negative for headaches, focal weakness or numbness.  10-point ROS otherwise negative.  ____________________________________________   PHYSICAL EXAM:  VITAL SIGNS: ED Triage Vitals  Enc Vitals Group     BP 07/31/15 2233 125/55 mmHg  Pulse Rate 07/31/15 2233 65     Resp 07/31/15 2233 18     Temp 07/31/15 2233 98.4 F (36.9 C)     Temp Source 07/31/15 2233 Oral     SpO2 07/31/15 2233 97 %     Weight 07/31/15 2233 330 lb (149.687 kg)     Height 07/31/15 2233  (1.727 m)     Head Cir --      Peak Flow --      Pain Score 07/31/15 2233 8      Pain Loc --      Pain Edu? --      Excl. in GC? --     Constitutional: Alert and oriented. Well appearing and in no acute distress. Eyes: Conjunctivae are normal. PERRL. EOMI. Head: Atraumatic. Ears: Left TM within normal limits. Right TM bulging with suppurative fluid. No TM rupture. Nose: No congestion/rhinnorhea. Mouth/Throat: Mucous membranes are moist.  Oropharynx non-erythematous. Neck: No stridor.  Supple neck without meningismus. Cardiovascular: Normal rate, regular rhythm. Grossly normal heart sounds.  Good peripheral circulation. Respiratory: Normal respiratory effort.  No retractions. Lungs CTAB. Gastrointestinal: Soft and nontender. No distention. No abdominal bruits. No CVA tenderness. Musculoskeletal: No lower extremity tenderness nor edema.  No joint effusions. Neurologic:  Normal speech and language. No gross focal neurologic deficits are appreciated. No gait instability. Skin:  Skin is warm, dry and intact. No rash noted. Psychiatric: Mood and affect are normal. Speech and behavior are normal.  ____________________________________________   LABS (all labs ordered are listed, but only abnormal results are displayed)  Labs Reviewed - No data to display ____________________________________________  EKG  None ____________________________________________  RADIOLOGY  None ____________________________________________   PROCEDURES  Procedure(s) performed: None  Critical Care performed: No  ____________________________________________   INITIAL IMPRESSION / ASSESSMENT AND PLAN / ED COURSE  Pertinent labs & imaging results that were available during my care of the patient were reviewed by me and considered in my medical decision making (see chart for details).  25 year old female who presents with right otitis media and migraine headache without photophobia, nausea or vomiting. Will initiate treatment with clarithromycin, Fioricet for migraine and patient  will follow up with her PCP next week. Strict return precautions given. Patient spouse verbalize understanding and agree with plan of care. ____________________________________________   FINAL CLINICAL IMPRESSION(S) / ED DIAGNOSES  Final diagnoses:  Acute suppurative otitis media of right ear without spontaneous rupture of tympanic membrane, recurrence not specified  Other migraine without status migrainosus, not intractable      Irean Hong, MD 08/01/15 [phone]

## 2015-09-26 IMAGING — US US PELV - US TRANSVAGINAL
1 series · 13 of 25 positions shown · non-contrast
Comparison: MRI pelvis 10/19/2013.  Ultrasound pelvis 04/20/2013.

CLINICAL DATA: Intrauterine device placement. Pain and pelvic
discharge. Last menstrual period was 2 months previous.

EXAM:
TRANSABDOMINAL AND TRANSVAGINAL ULTRASOUND OF PELVIS
TECHNIQUE: Both transabdominal and transvaginal ultrasound examinations of the
pelvis were performed. Transabdominal technique was performed for
global imaging of the pelvis including uterus, ovaries, adnexal
regions, and pelvic cul-de-sac. It was necessary to proceed with
endovaginal exam following the transabdominal exam to visualize the
uterus and endometrium.

[Series 1: us pelv - us transvaginal · 0.24mm/px · 13 of 56 slices shown]
[im 1/56]
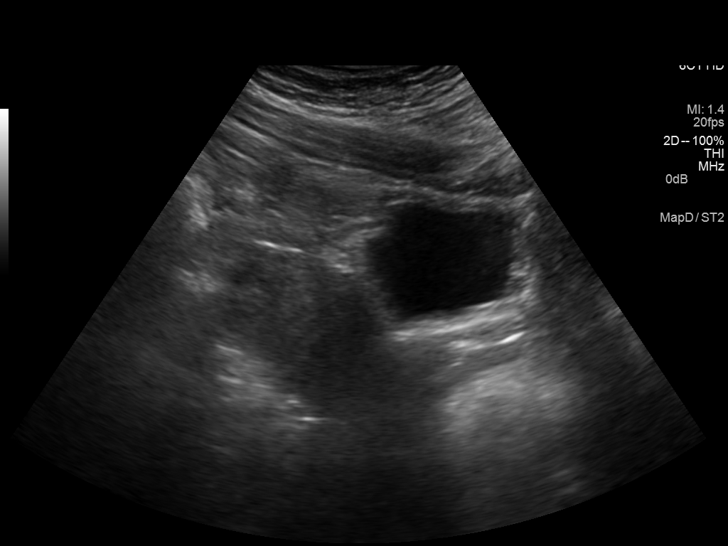
[im 5/56]
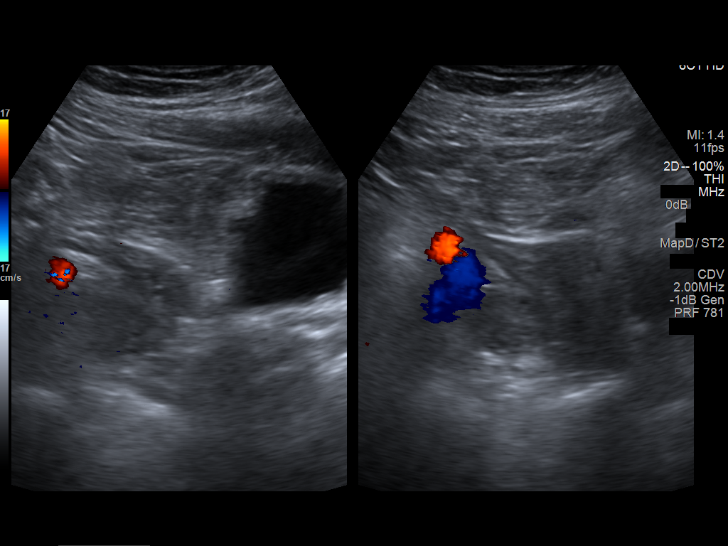
[im 10/56]
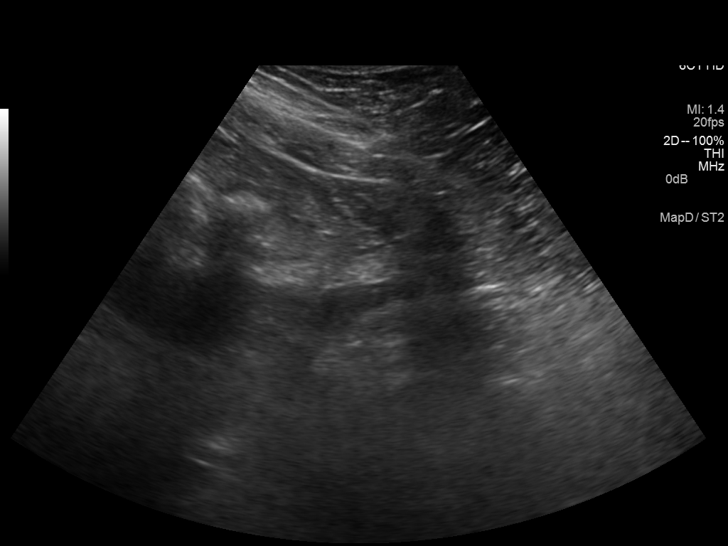
[im 14/56]
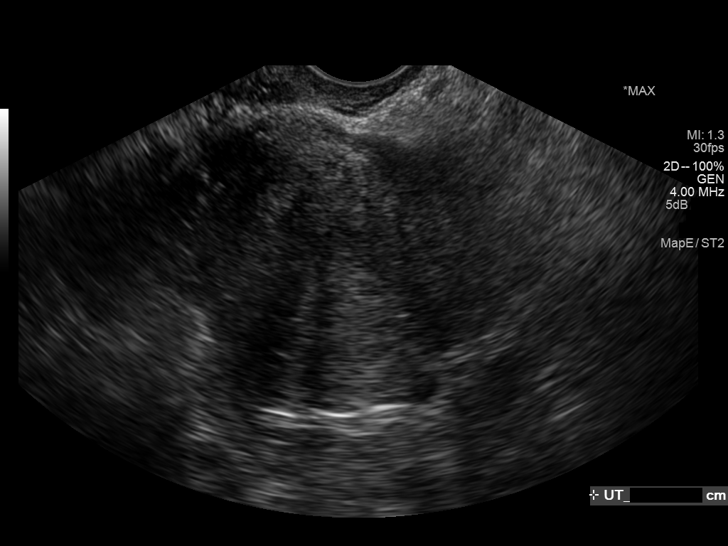
[im 19/56]
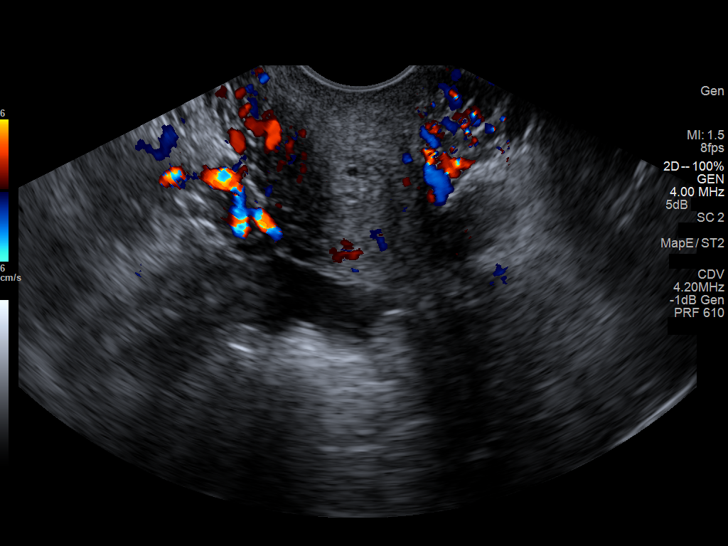
[im 23/56]
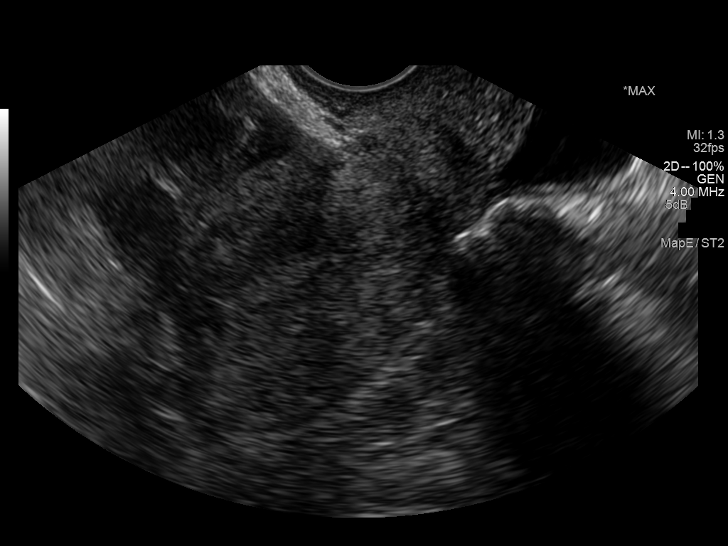
[im 28/56]
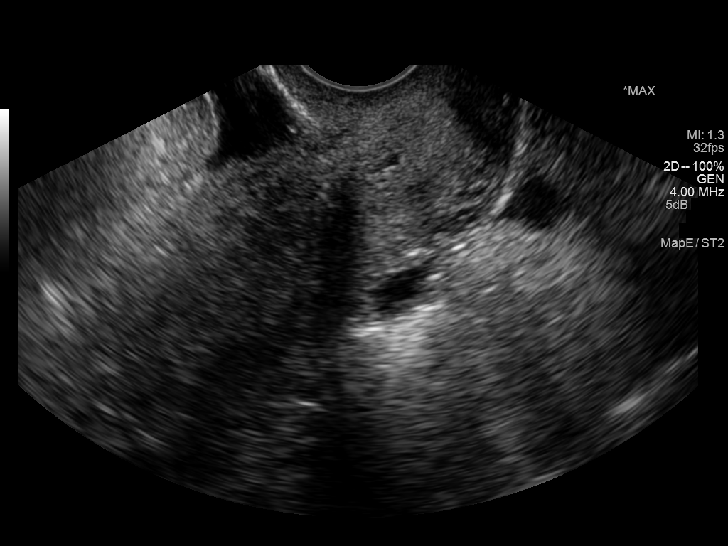
[im 33/56]
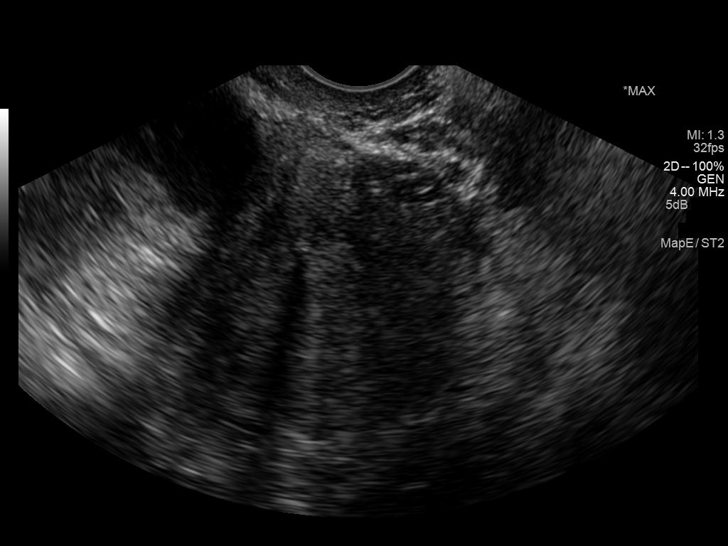
[im 37/56]
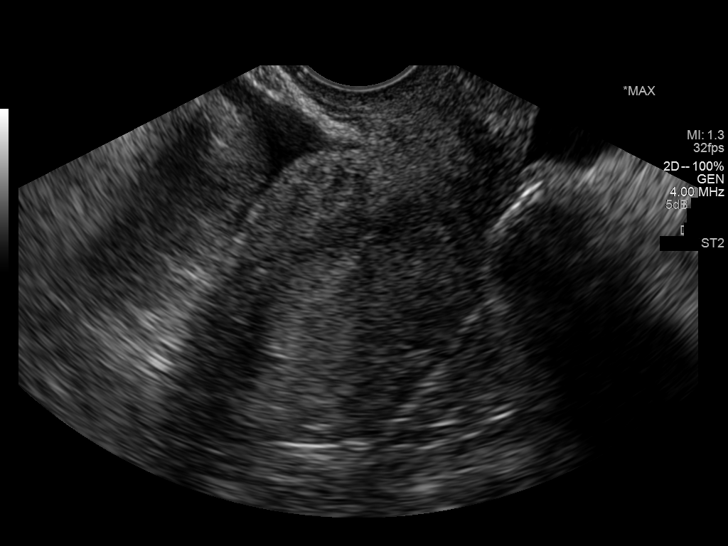
[im 42/56]
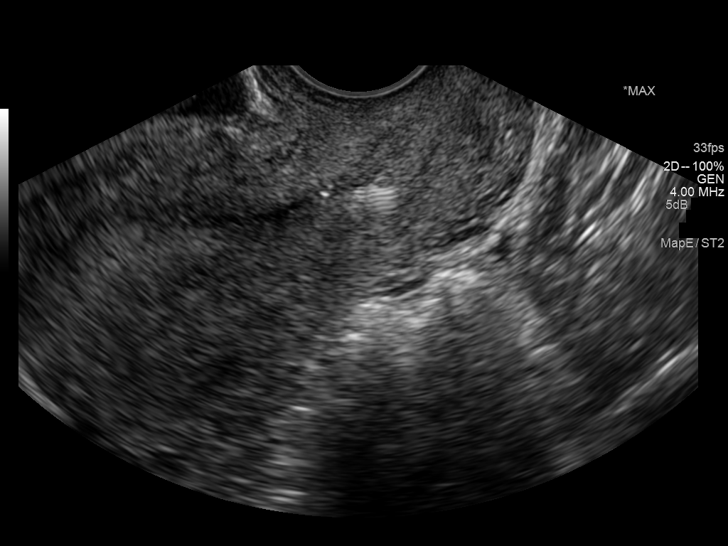
[im 46/56]
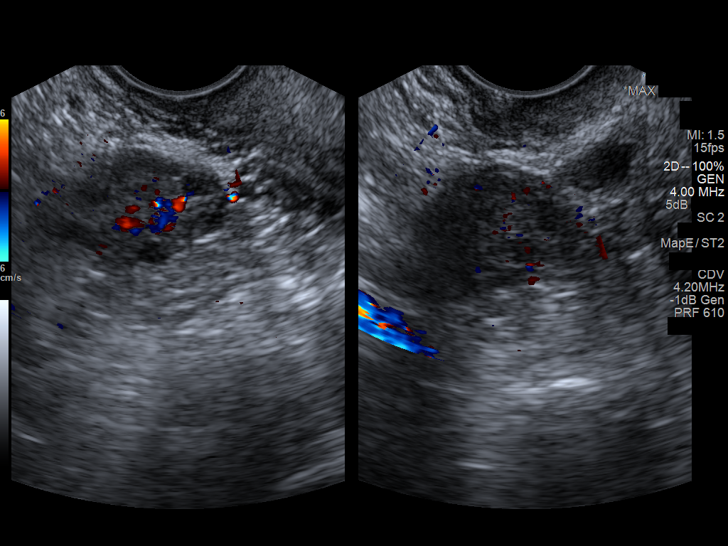
[im 51/56]
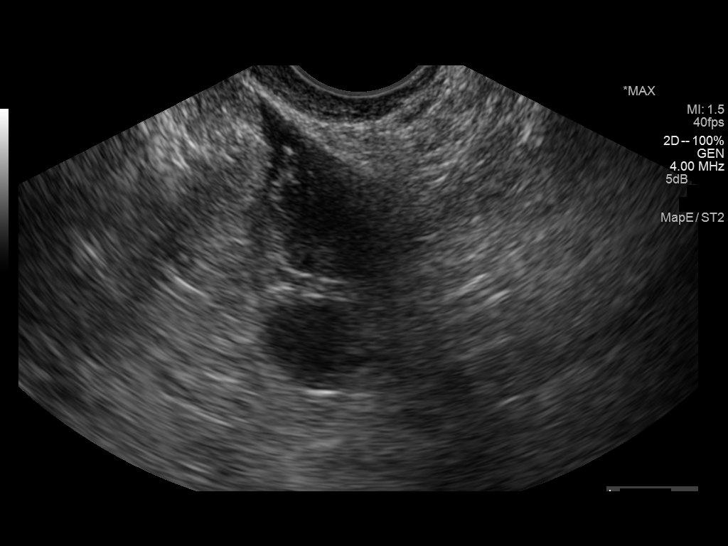
[im 56/56]
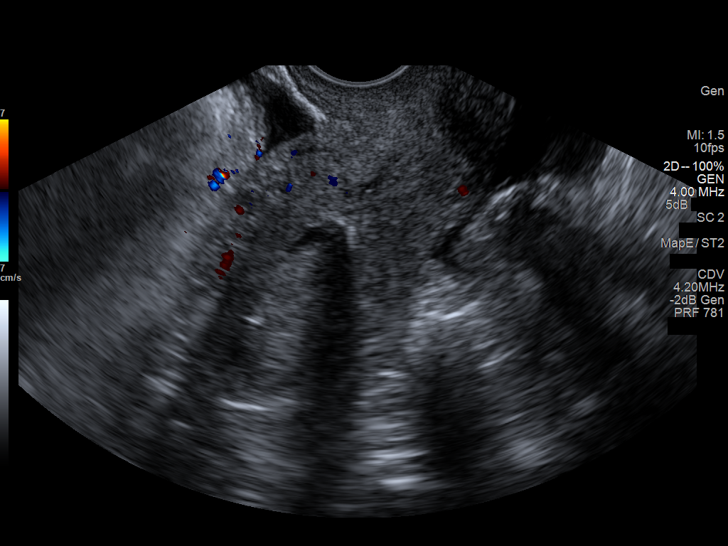

[13 of 25 positions shown; findings below may reference images not displayed]

FINDINGS: Uterus

Measurements: 9.1 x 4.4 x 5.6 cm, anteverted. No fibroids or other
mass visualized. Small nabothian cysts in the cervix.

Endometrium

Thickness: 8 mm. Difficult visualization of intrauterine device due
to patient's body habitus. Echogenic structure with shadowing
demonstrated consistent with intrauterine device and appears to be
located in the lower uterine segment. Small amount of free fluid in
the endometrium.

Right ovary

Measurements: 3.4 x 2 x 2.7 cm. Normal appearance/no adnexal mass.

Left ovary

Measurements: 4.7 x 3.4 x 2.8 cm. Small circumscribed cystic lesions
with homogeneous low level internal echoes demonstrated. These
measure 1.9 and 1.5 cm maximal diameter, respectively. No flow is
demonstrated on color flow Doppler imaging. Appearance is consistent
with small hemorrhagic cysts or endometriomas. Recommend followup
ultrasound in 6-12 weeks.

Other findings

Small amount of free fluid demonstrated in the pelvis.
IMPRESSION: Intrauterine device appears to be localized in the lower uterine
segment. Small amount of fluid in the endometrium without
thickening. Complex cysts in the left ovary with homogeneous
low-level internal echoes which could represent hemorrhagic cysts or
endometriomas. Recommend followup ultrasound in 6-12 weeks. This
recommendation follows the consensus statement: Management of
Asymptomatic Ovarian and Other Adnexal Cysts Imaged at US: Society
of Radiologists in Ultrasound Consensus Conference Statement.

## 2015-10-06 ENCOUNTER — Encounter: Payer: Self-pay | Admitting: *Deleted

## 2015-10-06 ENCOUNTER — Ambulatory Visit
Admission: EM | Admit: 2015-10-06 | Discharge: 2015-10-06 | Disposition: A | Discharge status: Home / Self Care | Payer: Medicaid Other | Attending: Family Medicine | Admitting: Family Medicine

## 2015-10-06 DIAGNOSIS — J0101 Acute recurrent maxillary sinusitis: Secondary | ICD-10-CM

## 2015-10-06 MED ORDER — CEFUROXIME AXETIL 250 MG PO TABS: ORAL_TABLET | ORAL | Status: DC

## 2015-10-06 NOTE — ED Provider Notes (Signed)
CSN: [id number]     Arrival date & time 10/06/15  1749 History   First MD Initiated Contact with Patient 10/06/15 2022     Chief Complaint  Patient presents with  . Nasal Congestion  . Otalgia   (Consider location/radiation/quality/duration/timing/severity/associated sxs/prior Treatment) HPI   This a 25 year old morbidly obese female who presents with the symptoms of nasal congestion bilateral ear pain that she's had for over a week. Has progressively become worse. He has an extensive previous history of ear infections sinus congestion and sinusitis as well as allergies. She states she's been having yellow sputum from her nose and also coughing it up. She denies any fever or chills. She has an allergy to amoxicillin and has been able to tolerate cephalosporins in the past. She does currently take Dynamist on a daily basis      Past Medical History  Diagnosis Date  . MRSA (methicillin resistant staph aureus) culture positive   . Asthma   . Migraine   . Pyloric stenosis    History reviewed. No pertinent past surgical history. History reviewed. No pertinent family history. Social History  Substance Use Topics  . Smoking status: Never Smoker   . Smokeless tobacco: None  . Alcohol Use: No     Comment: rarely   OB History    No data available     Review of Systems  Constitutional: Positive for activity change. Negative for fever, chills and fatigue.  HENT: Positive for congestion, ear pain, postnasal drip, rhinorrhea, sinus pressure and sneezing.   Respiratory: Positive for cough. Negative for shortness of breath, wheezing and stridor.   All other systems reviewed and are negative.   Allergies  Cefprozil; Amoxil; and Augmentin  Home Medications   Prior to Admission medications   Medication Sig Start Date End Date Taking? Authorizing Provider  ADDERALL XR 20 MG 24 hr capsule Take 1 capsule by mouth 2 (two) times daily. 03/05/15  Yes Historical Provider, MD  albuterol  (PROVENTIL HFA;VENTOLIN HFA) 108 (90 Base) MCG/ACT inhaler Inhale 2 puffs into the lungs every 4 (four) hours as needed for wheezing or shortness of breath. 06/23/15  Yes Jonathan D Cuthriell, PA-C  AMITIZA 8 MCG capsule Take 1 capsule by mouth 2 (two) times daily. 03/14/15  Yes Historical Provider, MD  baclofen (LIORESAL) 10 MG tablet Take 1-2 tablets by mouth at bedtime. 03/06/15  Yes Historical Provider, MD  beclomethasone (QVAR) 80 MCG/ACT inhaler Inhale 2 puffs into the lungs 2 (two) times daily.   Yes Historical Provider, MD  cetirizine (ZYRTEC) 10 MG tablet Take 1 tablet (10 mg total) by mouth daily. 06/23/15  Yes Christiane Ha D Cuthriell, PA-C  clonazePAM (KLONOPIN) 1 MG tablet Take 1 mg by mouth 3 (three) times daily as needed for anxiety.   Yes Historical Provider, MD  diazepam (VALIUM) 10 MG tablet Take 10 mg by mouth daily.   Yes Historical Provider, MD  gabapentin (NEURONTIN) 300 MG capsule Take 1 capsule by mouth 2 (two) times daily. 03/13/15  Yes Historical Provider, MD  lamoTRIgine (LAMICTAL) 200 MG tablet Take 200 mg by mouth 2 (two) times daily.   Yes Historical Provider, MD  nabumetone (RELAFEN) 750 MG tablet Take 1 tablet by mouth 2 (two) times daily. 03/06/15  Yes Historical Provider, MD  ondansetron (ZOFRAN ODT) 4 MG disintegrating tablet Take 1 tablet (4 mg total) by mouth every 6 (six) hours as needed for nausea or vomiting. 04/10/15  Yes Sharyn Creamer, MD  rizatriptan (MAXALT) 10 MG tablet Take  1 tablet by mouth every 2 (two) hours as needed. 03/06/15  Yes Historical Provider, MD  sertraline (ZOLOFT) 100 MG tablet Take 100 mg by mouth daily.   Yes Historical Provider, MD  topiramate (TOPAMAX) 100 MG tablet Take 100 mg by mouth at bedtime. 03/06/15  Yes Historical Provider, MD  benzonatate (TESSALON PERLES) 100 MG capsule Take 1 capsule (100 mg total) by mouth every 6 (six) hours as needed for cough. 04/27/15 04/26/16  Rebecka ApleyAllison P Webster, MD  cefdinir (OMNICEF) 300 MG capsule Take 1 capsule  (300 mg total) by mouth 2 (two) times daily. 03/04/15   Payton Mccallumrlando Conty, MD  cefUROXime (CEFTIN) 250 MG tablet Take one tablet BID with food 10/06/15   Lutricia FeilWilliam P Roemer, PA-C  chlorpheniramine-HYDROcodone [facility](TUSSIONEX PENNKINETIC ER) 10-8 MG/5ML SUER Take 5 mLs by mouth 2 (two) times daily. 07/01/15   Rebecka ApleyAllison P Webster, MD  doxycycline (VIBRA-TABS) 100 MG tablet Take 1 tablet (100 mg total) by mouth 2 (two) times daily. 06/23/15   Delorise RoyalsJonathan D Cuthriell, PA-C  fluticasone (FLONASE) 50 MCG/ACT nasal spray Place 1 spray into both nostrils 2 (two) times daily. 06/23/15   Delorise RoyalsJonathan D Cuthriell, PA-C  ibuprofen (ADVIL,MOTRIN) 800 MG tablet Take 1 tablet (800 mg total) by mouth every 8 (eight) hours as needed. 03/13/15   Ignacia Bayleyobert Tumey, PA-C  ketorolac (ACULAR) 0.4 % SOLN Place 1 drop into both eyes daily. 03/04/15   Historical Provider, MD  loratadine (CLARITIN) 10 MG tablet Take 1 tablet by mouth daily. 03/13/15   Historical Provider, MD  metoprolol (TOPROL-XL) 200 MG 24 hr tablet Take 200 mg by mouth daily.    Historical Provider, MD  montelukast (SINGULAIR) 10 MG tablet Take 10 mg by mouth at bedtime.    Historical Provider, MD  predniSONE (DELTASONE) 10 MG tablet Take 1 tablet (10 mg total) by mouth as directed. 06/23/15   Delorise RoyalsJonathan D Cuthriell, PA-C  sulfamethoxazole-trimethoprim (BACTRIM DS,SEPTRA DS) 800-160 MG tablet Take 1 tablet by mouth 2 (two) times daily. 04/30/15   Payton Mccallumrlando Conty, MD   Meds Ordered and Administered this Visit  Medications - No data to display  BP 127/79 mmHg  Pulse 78  Temp(Src) 98.5 F (36.9 C) (Oral)  Resp 18  Ht 5\' 8"  (1.727 m)  Wt 330 lb (149.687 kg)  BMI 50.19 kg/m2  SpO2 99% No data found.   Physical Exam  Constitutional: She is oriented to person, place, and time. She appears well-developed and well-nourished. No distress.  HENT:  Head: Normocephalic and atraumatic.  Nose: Nose normal.  Mouth/Throat: Oropharynx is clear and moist. No oropharyngeal exudate.  Right ear  shows a loss of the light reflex. There is no erythema but there is some bulging noticed with air-fluid level.  Eyes: Conjunctivae are normal. Pupils are equal, round, and reactive to light. Right eye exhibits no discharge. Left eye exhibits no discharge.  Neck: Normal range of motion. Neck supple.  Pulmonary/Chest: Breath sounds normal. No respiratory distress. She has no wheezes. She has no rales.  Musculoskeletal: Normal range of motion. She exhibits no edema or tenderness.  Lymphadenopathy:    She has no cervical adenopathy.  Neurological: She is alert and oriented to person, place, and time.  Skin: Skin is warm and dry. She is not diaphoretic.  Psychiatric: She has a normal mood and affect. Her behavior is normal. Judgment and thought content normal.    ED Course  Procedures (including critical care time)  Labs Review Labs Reviewed - No data to display  Imaging Review No results found.   Visual Acuity Review  Right Eye Distance:   Left Eye Distance:   Bilateral Distance:    Right Eye Near:   Left Eye Near:    Bilateral Near:         MDM   1. Acute recurrent maxillary sinusitis    Discharge Medication List as of 10/06/2015  8:36 PM    START taking these medications   Details  cefUROXime (CEFTIN) 250 MG tablet Take one tablet BID with food, Print      Plan: 1. Test/x-ray results and diagnosis reviewed with patient 2. rx as per orders; risks, benefits, potential side effects reviewed with patient 3. Recommend supportive treatment with Continued use of nasal steroid. Cause of recurrence of the ear and sinus problems I recommended that she be evaluated by an ENT. He should be seen by her primary care physician is not him improving and consideration for referral . 4. F/u prn if symptoms worsen or don't improve     Lutricia Feil, PA-C 10/06/15 2141  Lutricia Feil, PA-C 10/06/15 2141

## 2015-10-06 NOTE — ED Notes (Signed)
Patient started having symptoms of nasal congestion, and bilateral ear pain for one week. The ear pain has progressively become worse. Patient does have a history of ear infections, sinus congestion, and allergies.

## 2015-10-06 NOTE — Discharge Instructions (Signed)
Sinus Rinse WHAT IS A SINUS RINSE? A sinus rinse is a simple home treatment that is used to rinse your sinuses with a sterile mixture of salt and water (saline solution). Sinuses are air-filled spaces in your skull behind the bones of your face and forehead that open into your nasal cavity. You will use the following:  Saline solution.  Neti pot or spray bottle. This releases the saline solution into your nose and through your sinuses. Neti pots and spray bottles can be purchased at your local pharmacy, a health food store, or online. WHEN WOULD I DO A SINUS RINSE? A sinus rinse can help to clear mucus, dirt, dust, or pollen from the nasal cavity. You may do a sinus rinse when you have a cold, a virus, nasal allergy symptoms, a sinus infection, or stuffiness in the nose or sinuses. If you are considering a sinus rinse:  Ask your child's health care provider before performing a sinus rinse on your child.  Do not do a sinus rinse if you have had ear or nasal surgery, ear infection, or blocked ears. HOW DO I DO A SINUS RINSE?  Wash your hands.  Disinfect your device according to the directions provided and then dry it.  Use the solution that comes with your device or one that is sold separately in stores. Follow the mixing directions on the package.  Fill your device with the amount of saline solution as directed by the device instructions.  Stand over a sink and tilt your head sideways over the sink.  Place the spout of the device in your upper nostril (the one closer to the ceiling).  Gently pour or squeeze the saline solution into the nasal cavity. The liquid should drain to the lower nostril if you are not overly congested.  Gently blow your nose. Blowing too hard may cause ear pain.  Repeat in the other nostril.  Clean and rinse your device with clean water and then air-dry it. ARE THERE RISKS OF A SINUS RINSE?  Sinus rinse is generally very safe and effective. However, there  are a few risks, which include:   A burning sensation in the sinuses. This may happen if you do not make the saline solution as directed. Make sure to follow all directions when making the saline solution.  Infection from contaminated water. This is rare, but possible.  Nasal irritation.   This information is not intended to replace advice given to you by your health care provider. Make sure you discuss any questions you have with your health care provider.   Document Released: 12/19/2013 Document Reviewed: 12/19/2013 Elsevier Interactive Patient Education 2016 [employer]  Sinusitis, Adult Sinusitis is redness, soreness, and inflammation of the paranasal sinuses. Paranasal sinuses are air pockets within the bones of your face. They are located beneath your eyes, in the middle of your forehead, and above your eyes. In healthy paranasal sinuses, mucus is able to drain out, and air is able to circulate through them by way of your nose. However, when your paranasal sinuses are inflamed, mucus and air can become trapped. This can allow bacteria and other germs to grow and cause infection. Sinusitis can develop quickly and last only a short time (acute) or continue over a long period (chronic). Sinusitis that lasts for more than 12 weeks is considered chronic. CAUSES Causes of sinusitis include:  Allergies.  Structural abnormalities, such as displacement of the cartilage that separates your nostrils (deviated septum), which can decrease the air flow   through your nose and sinuses and affect sinus drainage.  Functional abnormalities, such as when the small hairs (cilia) that line your sinuses and help remove mucus do not work properly or are not present. SIGNS AND SYMPTOMS Symptoms of acute and chronic sinusitis are the same. The primary symptoms are pain and pressure around the affected sinuses. Other symptoms include:  Upper toothache.  Earache.  Headache.  Bad breath.  Decreased  sense of smell and taste.  A cough, which worsens when you are lying flat.  Fatigue.  Fever.  Thick drainage from your nose, which often is green and may contain pus (purulent).  Swelling and warmth over the affected sinuses. DIAGNOSIS Your health care provider will perform a physical exam. During your exam, your health care provider may perform any of the following to help determine if you have acute sinusitis or chronic sinusitis:  Look in your nose for signs of abnormal growths in your nostrils (nasal polyps).  Tap over the affected sinus to check for signs of infection.  View the inside of your sinuses using an imaging device that has a light attached (endoscope). If your health care provider suspects that you have chronic sinusitis, one or more of the following tests may be recommended:  Allergy tests.  Nasal culture. A sample of mucus is taken from your nose, sent to a lab, and screened for bacteria.  Nasal cytology. A sample of mucus is taken from your nose and examined by your health care provider to determine if your sinusitis is related to an allergy. TREATMENT Most cases of acute sinusitis are related to a viral infection and will resolve on their own within 10 days. Sometimes, medicines are prescribed to help relieve symptoms of both acute and chronic sinusitis. These may include pain medicines, decongestants, nasal steroid sprays, or saline sprays. However, for sinusitis related to a bacterial infection, your health care provider will prescribe antibiotic medicines. These are medicines that will help kill the bacteria causing the infection. Rarely, sinusitis is caused by a fungal infection. In these cases, your health care provider will prescribe antifungal medicine. For some cases of chronic sinusitis, surgery is needed. Generally, these are cases in which sinusitis recurs more than 3 times per year, despite other treatments. HOME CARE INSTRUCTIONS  Drink plenty of  water. Water helps thin the mucus so your sinuses can drain more easily.  Use a humidifier.  Inhale steam 3-4 times a day (for example, sit in the bathroom with the shower running).  Apply a warm, moist washcloth to your face 3-4 times a day, or as directed by your health care provider.  Use saline nasal sprays to help moisten and clean your sinuses.  Take medicines only as directed by your health care provider.  If you were prescribed either an antibiotic or antifungal medicine, finish it all even if you start to feel better. SEEK IMMEDIATE MEDICAL CARE IF:  You have increasing pain or severe headaches.  You have nausea, vomiting, or drowsiness.  You have swelling around your face.  You have vision problems.  You have a stiff neck.  You have difficulty breathing.   This information is not intended to replace advice given to you by your health care provider. Make sure you discuss any questions you have with your health care provider.   Document Released: 05/24/2005 Document Revised: 06/14/2014 Document Reviewed: 06/08/2011 Elsevier Interactive Patient Education 2016 [employer]  

## 2015-10-17 ENCOUNTER — Encounter: Payer: Self-pay | Admitting: Emergency Medicine

## 2015-10-17 ENCOUNTER — Ambulatory Visit
Admission: EM | Admit: 2015-10-17 | Discharge: 2015-10-17 | Disposition: A | Discharge status: Home / Self Care | Payer: Medicaid Other | Attending: Family Medicine | Admitting: Family Medicine

## 2015-10-17 ENCOUNTER — Ambulatory Visit: Payer: Medicaid Other

## 2015-10-17 DIAGNOSIS — G43909 Migraine, unspecified, not intractable, without status migrainosus: Secondary | ICD-10-CM | POA: Diagnosis not present

## 2015-10-17 DIAGNOSIS — K311 Adult hypertrophic pyloric stenosis: Secondary | ICD-10-CM | POA: Diagnosis not present

## 2015-10-17 DIAGNOSIS — N926 Irregular menstruation, unspecified: Secondary | ICD-10-CM | POA: Diagnosis not present

## 2015-10-17 DIAGNOSIS — Z8614 Personal history of Methicillin resistant Staphylococcus aureus infection: Secondary | ICD-10-CM | POA: Insufficient documentation

## 2015-10-17 DIAGNOSIS — J029 Acute pharyngitis, unspecified: Secondary | ICD-10-CM | POA: Diagnosis not present

## 2015-10-17 DIAGNOSIS — J45909 Unspecified asthma, uncomplicated: Secondary | ICD-10-CM | POA: Insufficient documentation

## 2015-10-17 DIAGNOSIS — J40 Bronchitis, not specified as acute or chronic: Secondary | ICD-10-CM | POA: Insufficient documentation

## 2015-10-17 DIAGNOSIS — J209 Acute bronchitis, unspecified: Secondary | ICD-10-CM | POA: Diagnosis not present

## 2015-10-17 DIAGNOSIS — M940 Chondrocostal junction syndrome [Tietze]: Secondary | ICD-10-CM

## 2015-10-17 LAB — PREGNANCY, URINE: PREG TEST UR: NEGATIVE

## 2015-10-17 LAB — RAPID STREP SCREEN (MED CTR MEBANE ONLY): STREPTOCOCCUS, GROUP A SCREEN (DIRECT): NEGATIVE

## 2015-10-17 MED ORDER — MELOXICAM 15 MG PO TABS: 15.0000 mg | ORAL_TABLET | Freq: Every day | ORAL | Status: DC

## 2015-10-17 MED ORDER — ALBUTEROL SULFATE HFA 108 (90 BASE) MCG/ACT IN AERS: 2.0000 | INHALATION_SPRAY | Freq: Four times a day (QID) | RESPIRATORY_TRACT | Status: DC | PRN

## 2015-10-17 MED ORDER — PREDNISONE 10 MG (21) PO TBPK: ORAL_TABLET | ORAL | Status: DC

## 2015-10-17 MED ORDER — BENZONATATE 100 MG PO CAPS: 100.0000 mg | ORAL_CAPSULE | Freq: Three times a day (TID) | ORAL | Status: DC

## 2015-10-17 MED ORDER — AZITHROMYCIN 500 MG PO TABS: ORAL_TABLET | ORAL | Status: DC

## 2015-10-17 MED ORDER — FEXOFENADINE-PSEUDOEPHED ER 180-240 MG PO TB24: 1.0000 | ORAL_TABLET | Freq: Every day | ORAL | Status: DC

## 2015-10-17 NOTE — ED Notes (Signed)
Patient c/o sore throat that started 2 days ago.  Patient denies fevers.  

## 2015-10-17 NOTE — ED Provider Notes (Signed)
CSN: [id number]     Arrival date & time 10/17/15  1924 History   First MD Initiated Contact with Patient 10/17/15 1939    Nurses notes were reviewed. Chief Complaint  Patient presents with  . Sore Throat    Patient with a sore throat and cough. Patient is a 25 year old motor obese white female who was seen here about 10 days ago. That time his Phillis KnackRoemer diagnosed her with having a sinus infection. She was allergic to amoxicillin Augmentin and Cefzil. She states that her significant other and child both were diagnosed with strep that she's when whether she has strep as well. I have explained to her that the Ceftin should taking care of strep the strep. The cough is now productive of yellowish-green sputum and has not gotten better despite being on the Ceftin. She reports shortness of breath cannot remember what her pulse ox was today. She's had a history of asthma migraine MRSA and pyloric stenosis. She states she does not smoke and no one smokes around her.   Son was positive for strep recently. No pertinent family medical history.  She also reports having some chest pain when further questioned chest pains occurs after a prolonged coughing and the chest pains in the chest.   She is also not had a missed. Since marginally worried about that as well.     (Consider location/radiation/quality/duration/timing/severity/associated sxs/prior Treatment) Patient is a 25 y.o. female presenting with pharyngitis. The history is provided by the patient. No language interpreter was used.  Sore Throat This is a new problem. The current episode started more than 1 week ago. The problem occurs constantly. The problem has not changed since onset.Associated symptoms include chest pain. Pertinent negatives include no abdominal pain, no headaches and no shortness of breath. Nothing aggravates the symptoms. Treatments tried: Ceftin.    Past Medical History  Diagnosis Date  . MRSA (methicillin resistant staph  aureus) culture positive   . Asthma   . Migraine   . Pyloric stenosis    History reviewed. No pertinent past surgical history. History reviewed. No pertinent family history. Social History  Substance Use Topics  . Smoking status: Never Smoker   . Smokeless tobacco: None  . Alcohol Use: No     Comment: rarely   OB History    No data available     Review of Systems  HENT: Positive for congestion, ear pain and sore throat.   Respiratory: Positive for cough. Negative for shortness of breath.   Cardiovascular: Positive for chest pain.  Gastrointestinal: Negative for abdominal pain.  Neurological: Negative for headaches.  All other systems reviewed and are negative.   Allergies  Cefprozil; Amoxil; and Augmentin  Home Medications   Prior to Admission medications   Medication Sig Start Date End Date Taking? Authorizing Provider  ADDERALL XR 20 MG 24 hr capsule Take 1 capsule by mouth 2 (two) times daily. 03/05/15   Historical Provider, MD  albuterol (PROVENTIL HFA;VENTOLIN HFA) 108 (90 Base) MCG/ACT inhaler Inhale 2 puffs into the lungs every 4 (four) hours as needed for wheezing or shortness of breath. 06/23/15   Delorise RoyalsJonathan D Cuthriell, PA-C  albuterol (PROVENTIL HFA;VENTOLIN HFA) 108 (90 Base) MCG/ACT inhaler Inhale 2 puffs into the lungs every 6 (six) hours as needed for wheezing or shortness of breath. 10/17/15   Hassan RowanEugene Angas Isabell, MD  AMITIZA 8 MCG capsule Take 1 capsule by mouth 2 (two) times daily. 03/14/15   Historical Provider, MD  azithromycin (ZITHROMAX) 500 MG tablet 1  tablet a day for the next 5 days 10/17/15   Hassan Rowan, MD  baclofen (LIORESAL) 10 MG tablet Take 1-2 tablets by mouth at bedtime. 03/06/15   Historical Provider, MD  beclomethasone (QVAR) 80 MCG/ACT inhaler Inhale 2 puffs into the lungs 2 (two) times daily.    Historical Provider, MD  benzonatate (TESSALON PERLES) 100 MG capsule Take 1 capsule (100 mg total) by mouth every 6 (six) hours as needed for cough. 04/27/15  04/26/16  Rebecka Apley, MD  benzonatate (TESSALON) 100 MG capsule Take 1 capsule (100 mg total) by mouth every 8 (eight) hours. 10/17/15   Hassan Rowan, MD  cefdinir (OMNICEF) 300 MG capsule Take 1 capsule (300 mg total) by mouth 2 (two) times daily. 03/04/15   Payton Mccallum, MD  cefUROXime (CEFTIN) 250 MG tablet Take one tablet BID with food 10/06/15   Lutricia Feil, PA-C  cetirizine (ZYRTEC) 10 MG tablet Take 1 tablet (10 mg total) by mouth daily. 06/23/15   Delorise Royals Cuthriell, PA-C  chlorpheniramine-HYDROcodone (TUSSIONEX PENNKINETIC ER) 10-8 MG/5ML SUER Take 5 mLs by mouth 2 (two) times daily. 07/01/15   Rebecka Apley, MD  clonazePAM (KLONOPIN) 1 MG tablet Take 1 mg by mouth 3 (three) times daily as needed for anxiety.    Historical Provider, MD  diazepam (VALIUM) 10 MG tablet Take 10 mg by mouth daily.    Historical Provider, MD  doxycycline (VIBRA-TABS) 100 MG tablet Take 1 tablet (100 mg total) by mouth 2 (two) times daily. 06/23/15   Delorise Royals Cuthriell, PA-C  fexofenadine-pseudoephedrine (ALLEGRA-D ALLERGY & CONGESTION) 180-240 MG 24 hr tablet Take 1 tablet by mouth daily. 10/17/15   Hassan Rowan, MD  fluticasone (FLONASE) 50 MCG/ACT nasal spray Place 1 spray into both nostrils 2 (two) times daily. 06/23/15   Delorise Royals Cuthriell, PA-C  gabapentin (NEURONTIN) 300 MG capsule Take 1 capsule by mouth 2 (two) times daily. 03/13/15   Historical Provider, MD  ibuprofen (ADVIL,MOTRIN) 800 MG tablet Take 1 tablet (800 mg total) by mouth every 8 (eight) hours as needed. 03/13/15   Ignacia Bayley, PA-C  ketorolac (ACULAR) 0.4 % SOLN Place 1 drop into both eyes daily. 03/04/15   Historical Provider, MD  lamoTRIgine (LAMICTAL) 200 MG tablet Take 200 mg by mouth 2 (two) times daily.    Historical Provider, MD  loratadine (CLARITIN) 10 MG tablet Take 1 tablet by mouth daily. 03/13/15   Historical Provider, MD  meloxicam (MOBIC) 15 MG tablet Take 1 tablet (15 mg total) by mouth daily. 10/17/15   Hassan Rowan,  MD  metoprolol (TOPROL-XL) 200 MG 24 hr tablet Take 200 mg by mouth daily.    Historical Provider, MD  montelukast (SINGULAIR) 10 MG tablet Take 10 mg by mouth at bedtime.    Historical Provider, MD  nabumetone (RELAFEN) 750 MG tablet Take 1 tablet by mouth 2 (two) times daily. 03/06/15   Historical Provider, MD  ondansetron (ZOFRAN ODT) 4 MG disintegrating tablet Take 1 tablet (4 mg total) by mouth every 6 (six) hours as needed for nausea or vomiting. 04/10/15   Sharyn Creamer, MD  predniSONE (DELTASONE) 10 MG tablet Take 1 tablet (10 mg total) by mouth as directed. 06/23/15   Delorise Royals Cuthriell, PA-C  predniSONE (STERAPRED UNI-PAK 21 TAB) 10 MG (21) TBPK tablet Sig 6 tablet day 1, 5 tablets day 2, 4 tablets day 3,,3tablets day 4, 2 tablets day 5, 1 tablet day 6 take all tablets orally 10/17/15   Hassan Rowan, MD  rizatriptan (MAXALT) 10 MG tablet Take 1 tablet by mouth every 2 (two) hours as needed. 03/06/15   Historical Provider, MD  sertraline (ZOLOFT) 100 MG tablet Take 100 mg by mouth daily.    Historical Provider, MD  sulfamethoxazole-trimethoprim (BACTRIM DS,SEPTRA DS) 800-160 MG tablet Take 1 tablet by mouth 2 (two) times daily. 04/30/15   Payton Mccallum, MD  topiramate (TOPAMAX) 100 MG tablet Take 100 mg by mouth at bedtime. 03/06/15   Historical Provider, MD   Meds Ordered and Administered this Visit  Medications - No data to display  BP 123/67 mmHg  Pulse 61  Temp(Src) 97.9 F (36.6 C) (Tympanic)  Resp 16  Ht 5\' 8"  (1.727 m)  Wt 330 lb (149.687 kg)  BMI 50.19 kg/m2  SpO2 99%  LMP  (LMP Unknown) No data found.   Physical Exam  Constitutional: She is oriented to person, place, and time. Vital signs are normal. She does not have a sickly appearance. She does not appear ill.  Morbidly obese white female  HENT:  Head: Normocephalic and atraumatic.  Right Ear: Hearing, tympanic membrane, external ear and ear canal normal.  Left Ear: Hearing, tympanic membrane, external ear and ear canal  normal.  Nose: Mucosal edema and rhinorrhea present. Right sinus exhibits maxillary sinus tenderness.  Mouth/Throat: Uvula is midline. No posterior oropharyngeal erythema.  Cardiovascular: Normal rate, regular rhythm and normal heart sounds.   Pulmonary/Chest: Effort normal and breath sounds normal. She exhibits tenderness and bony tenderness.    Patient has midsternal chest wall tenderness. Which reproduces the pain that she was having which explained to her is probably costochondritis from coughing that she's doing.  Musculoskeletal: Normal range of motion. She exhibits no edema or tenderness.  Neurological: She is alert and oriented to person, place, and time.  Skin: Skin is warm and dry.  Psychiatric: She has a normal mood and affect.  Vitals reviewed.   ED Course  Procedures (including critical care time)  Labs Review Labs Reviewed  RAPID STREP SCREEN (NOT AT [facility])  CULTURE, GROUP A STREP [facility])  PREGNANCY, URINE    Imaging Review Dg Chest 2 View  10/17/2015  CLINICAL DATA:  25 year old female with cough and midsternal pain. EXAM: CHEST  2 VIEW COMPARISON:  Chest radiograph dated 07/01/2015 FINDINGS: The heart size and mediastinal contours are within normal limits. Both lungs are clear. The visualized skeletal structures are unremarkable. IMPRESSION: No active cardiopulmonary disease. Electronically Signed   By: Elgie Collard M.D.   On: 10/17/2015 20:18     Visual Acuity Review  Right Eye Distance:   Left Eye Distance:   Bilateral Distance:    Right Eye Near:   Left Eye Near:    Bilateral Near:      Results for orders placed or performed during the hospital encounter of 10/17/15  Rapid strep screen  Result Value Ref Range   Streptococcus, Group A Screen (Direct) NEGATIVE NEGATIVE  Pregnancy, urine  Result Value Ref Range   Preg Test, Ur NEGATIVE NEGATIVE    MDM   1. Bronchitis, acute, with bronchospasm   2. Irregular menses   3. Acute pharyngitis,  unspecified etiology   4. Costochondritis, acute       Strep was negative as expected being on Ceftin. Pregnancy test was also negative. Will treat costochondritis with Mobic 18 mg 1 tablet day with a bronchitis for the cough will place on Zithromax 500 mg 1 tablet daily for 5 days. We'll place on Tessalon Perles for cough  albuterol inhaler and follow-up PCP if not better.  As above chest x-ray was negative. Will add Allegra-D to the medication and will add prednisone for the bronchospasm. Stressed importance following up with her PCP if not feeling better in 1-2 weeks.  .Note: This dictation was prepared with Dragon dictation along with smaller phrase technology. Any transcriptional errors that result from this process are unintentional.    Hassan Rowan, MD 10/17/15 2036

## 2015-10-17 NOTE — Discharge Instructions (Signed)
Acute Bronchitis Bronchitis is when the airways that extend from the windpipe into the lungs get red, puffy, and painful (inflamed). Bronchitis often causes thick spit (mucus) to develop. This leads to a cough. A cough is the most common symptom of bronchitis. In acute bronchitis, the condition usually begins suddenly and goes away over time (usually in 2 weeks). Smoking, allergies, and asthma can make bronchitis worse. Repeated episodes of bronchitis may cause more lung problems. HOME CARE  Rest.  Drink enough fluids to keep your pee (urine) clear or pale yellow (unless you need to limit fluids as told by your doctor).  Only take over-the-counter or prescription medicines as told by your doctor.  Avoid smoking and secondhand smoke. These can make bronchitis worse. If you are a smoker, think about using nicotine gum or skin patches. Quitting smoking will help your lungs heal faster.  Reduce the chance of getting bronchitis again by:  Washing your hands often.  Avoiding people with cold symptoms.  Trying not to touch your hands to your mouth, nose, or eyes.  Follow up with your doctor as told. GET HELP IF: Your symptoms do not improve after 1 week of treatment. Symptoms include:  Cough.  Fever.  Coughing up thick spit.  Body aches.  Chest congestion.  Chills.  Shortness of breath.  Sore throat. GET HELP RIGHT AWAY IF:   You have an increased fever.  You have chills.  You have severe shortness of breath.  You have bloody thick spit (sputum).  You throw up (vomit) often.  You lose too much body fluid (dehydration).  You have a severe headache.  You faint. MAKE SURE YOU:   Understand these instructions.  Will watch your condition.  Will get help right away if you are not doing well or get worse.   This information is not intended to replace advice given to you by your health care provider. Make sure you discuss any questions you have with your health care  provider.   Document Released: 11/10/2007 Document Revised: 01/24/2013 Document Reviewed: 11/14/2012 Elsevier Interactive Patient Education 2016 [employer] Inc[employer]  Costochondritis Costochondritis is a condition in which the tissue (cartilage) that connects your ribs with your breastbone (sternum) becomes irritated. It causes pain in the chest and rib area. It usually goes away on its own over time. HOME CARE  Avoid activities that wear you out.  Do not strain your ribs. Avoid activities that use your:  Chest.  Belly.  Side muscles.  Put ice on the area for the first 2 days after the pain starts.  Put ice in a plastic bag.  Place a towel between your skin and the bag.  Leave the ice on for 20 minutes, 2-3 times a day.  Only take medicine as told by your doctor. GET HELP IF:  You have redness or puffiness (swelling) in the rib area.  Your pain does not go away with rest or medicine. GET HELP RIGHT AWAY IF:   Your pain gets worse.  You are very uncomfortable.  You have trouble breathing.  You cough up blood.  You start sweating or throwing up (vomiting).  You have a fever or lasting symptoms for more than 2-3 days.  You have a fever and your symptoms suddenly get worse. MAKE SURE YOU:   Understand these instructions.  Will watch your condition.  Will get help right away if you are not doing well or get worse.   This information is not intended to replace advice  given to you by your health care provider. Make sure you discuss any questions you have with your health care provider.   Document Released: 11/10/2007 Document Revised: 01/24/2013 Document Reviewed: 12/26/2012 Elsevier Interactive Patient Education 2016 Elsevier Inc.  Pharyngitis Pharyngitis is a sore throat (pharynx). There is redness, pain, and swelling of your throat. HOME CARE   Drink enough fluids to keep your pee (urine) clear or pale yellow.  Only take medicine as told by your  doctor.  You may get sick again if you do not take medicine as told. Finish your medicines, even if you start to feel better.  Do not take aspirin.  Rest.  Rinse your mouth (gargle) with salt water ( tsp of salt per 1 qt of water) every 1-2 hours. This will help the pain.  If you are not at risk for choking, you can suck on hard candy or sore throat lozenges. GET HELP IF:  You have large, tender lumps on your neck.  You have a rash.  You cough up green, yellow-brown, or bloody spit. GET HELP RIGHT AWAY IF:   You have a stiff neck.  You drool or cannot swallow liquids.  You throw up (vomit) or are not able to keep medicine or liquids down.  You have very bad pain that does not go away with medicine.  You have problems breathing (not from a stuffy nose). MAKE SURE YOU:   Understand these instructions.  Will watch your condition.  Will get help right away if you are not doing well or get worse.   This information is not intended to replace advice given to you by your health care provider. Make sure you discuss any questions you have with your health care provider.   Document Released: 11/10/2007 Document Revised: 03/14/2013 Document Reviewed: 01/29/2013 Elsevier Interactive Patient Education 2016 Elsevier Inc.  Upper Respiratory Infection, Adult Most upper respiratory infections (URIs) are caused by a virus. A URI affects the nose, throat, and upper air passages. The most common type of URI is often called "the common cold." HOME CARE   Take medicines only as told by your doctor.  Gargle warm saltwater or take cough drops to comfort your throat as told by your doctor.  Use a warm mist humidifier or inhale steam from a shower to increase air moisture. This may make it easier to breathe.  Drink enough fluid to keep your pee (urine) clear or pale yellow.  Eat soups and other clear broths.  Have a healthy diet.  Rest as needed.  Go back to work when your fever  is gone or your doctor says it is okay.  You may need to stay home longer to avoid giving your URI to others.  You can also wear a face mask and wash your hands often to prevent spread of the virus.  Use your inhaler more if you have asthma.  Do not use any tobacco products, including cigarettes, chewing tobacco, or electronic cigarettes. If you need help quitting, ask your doctor. GET HELP IF:  You are getting worse, not better.  Your symptoms are not helped by medicine.  You have chills.  You are getting more short of breath.  You have brown or red mucus.  You have yellow or brown discharge from your nose.  You have pain in your face, especially when you bend forward.  You have a fever.  You have puffy (swollen) neck glands.  You have pain while swallowing.  You have white areas  in the back of your throat. GET HELP RIGHT AWAY IF:   You have very bad or constant:  Headache.  Ear pain.  Pain in your forehead, behind your eyes, and over your cheekbones (sinus pain).  Chest pain.  You have long-lasting (chronic) lung disease and any of the following:  Wheezing.  Long-lasting cough.  Coughing up blood.  A change in your usual mucus.  You have a stiff neck.  You have changes in your:  Vision.  Hearing.  Thinking.  Mood. MAKE SURE YOU:   Understand these instructions.  Will watch your condition.  Will get help right away if you are not doing well or get worse.   This information is not intended to replace advice given to you by your health care provider. Make sure you discuss any questions you have with your health care provider.   Document Released: 11/10/2007 Document Revised: 10/08/2014 Document Reviewed: 08/29/2013 Elsevier Interactive Patient Education [employer]

## 2015-10-19 LAB — CULTURE, GROUP A STREP (THRC)

## 2015-11-19 DIAGNOSIS — G43909 Migraine, unspecified, not intractable, without status migrainosus: Secondary | ICD-10-CM | POA: Insufficient documentation

## 2015-12-05 ENCOUNTER — Ambulatory Visit
Admission: EM | Admit: 2015-12-05 | Discharge: 2015-12-05 | Disposition: A | Discharge status: Home / Self Care | Payer: Medicaid Other | Attending: Emergency Medicine | Admitting: Emergency Medicine

## 2015-12-05 DIAGNOSIS — H6501 Acute serous otitis media, right ear: Secondary | ICD-10-CM | POA: Insufficient documentation

## 2015-12-05 DIAGNOSIS — J029 Acute pharyngitis, unspecified: Secondary | ICD-10-CM | POA: Diagnosis present

## 2015-12-05 LAB — RAPID STREP SCREEN (MED CTR MEBANE ONLY): Streptococcus, Group A Screen (Direct): NEGATIVE

## 2015-12-05 MED ORDER — AZITHROMYCIN 250 MG PO TABS: ORAL_TABLET | ORAL | Status: DC

## 2015-12-05 NOTE — ED Notes (Signed)
Patient presents with sore throat and drainage. It started about a week ago.

## 2015-12-05 NOTE — Discharge Instructions (Signed)
Otitis Media, Adult Otitis media is redness, soreness, and inflammation of the middle ear. Otitis media may be caused by allergies or, most commonly, by infection. Often it occurs as a complication of the common cold. SIGNS AND SYMPTOMS Symptoms of otitis media may include:  Earache.  Fever.  Ringing in your ear.  Headache.  Leakage of fluid from the ear. DIAGNOSIS To diagnose otitis media, your health care provider will examine your ear with an otoscope. This is an instrument that allows your health care provider to see into your ear in order to examine your eardrum. Your health care provider also will ask you questions about your symptoms. TREATMENT  Typically, otitis media resolves on its own within 3-5 days. Your health care provider may prescribe medicine to ease your symptoms of pain. If otitis media does not resolve within 5 days or is recurrent, your health care provider may prescribe antibiotic medicines if he or she suspects that a bacterial infection is the cause. HOME CARE INSTRUCTIONS   If you were prescribed an antibiotic medicine, finish it all even if you start to feel better.  Take medicines only as directed by your health care provider.  Keep all follow-up visits as directed by your health care provider. SEEK MEDICAL CARE IF:  You have otitis media only in one ear, or bleeding from your nose, or both.  You notice a lump on your neck.  You are not getting better in 3-5 days.  You feel worse instead of better. SEEK IMMEDIATE MEDICAL CARE IF:   You have pain that is not controlled with medicine.  You have swelling, redness, or pain around your ear or stiffness in your neck.  You notice that part of your face is paralyzed.  You notice that the bone behind your ear (mastoid) is tender when you touch it. MAKE SURE YOU:   Understand these instructions.  Will watch your condition.  Will get help right away if you are not doing well or get worse.   This  information is not intended to replace advice given to you by your health care provider. Make sure you discuss any questions you have with your health care provider.   Document Released: 02/27/2004 Document Revised: 06/14/2014 Document Reviewed: 12/19/2012 Elsevier Interactive Patient Education 2016 [employer]  Otitis Media, Adult Otitis media is redness, soreness, and inflammation of the middle ear. Otitis media may be caused by allergies or, most commonly, by infection. Often it occurs as a complication of the common cold. SIGNS AND SYMPTOMS Symptoms of otitis media may include:  Earache.  Fever.  Ringing in your ear.  Headache.  Leakage of fluid from the ear. DIAGNOSIS To diagnose otitis media, your health care provider will examine your ear with an otoscope. This is an instrument that allows your health care provider to see into your ear in order to examine your eardrum. Your health care provider also will ask you questions about your symptoms. TREATMENT  Typically, otitis media resolves on its own within 3-5 days. Your health care provider may prescribe medicine to ease your symptoms of pain. If otitis media does not resolve within 5 days or is recurrent, your health care provider may prescribe antibiotic medicines if he or she suspects that a bacterial infection is the cause. HOME CARE INSTRUCTIONS   If you were prescribed an antibiotic medicine, finish it all even if you start to feel better.  Take medicines only as directed by your health care provider.  Keep all follow-up  visits as directed by your health care provider. SEEK MEDICAL CARE IF:  You have otitis media only in one ear, or bleeding from your nose, or both.  You notice a lump on your neck.  You are not getting better in 3-5 days.  You feel worse instead of better. SEEK IMMEDIATE MEDICAL CARE IF:   You have pain that is not controlled with medicine.  You have swelling, redness, or pain around your ear  or stiffness in your neck.  You notice that part of your face is paralyzed.  You notice that the bone behind your ear (mastoid) is tender when you touch it. MAKE SURE YOU:   Understand these instructions.  Will watch your condition.  Will get help right away if you are not doing well or get worse.   This information is not intended to replace advice given to you by your health care provider. Make sure you discuss any questions you have with your health care provider.   Document Released: 02/27/2004 Document Revised: 06/14/2014 Document Reviewed: 12/19/2012 Elsevier Interactive Patient Education [employer] Else[employer] Inc.

## 2015-12-05 NOTE — ED Provider Notes (Signed)
CSN: [id number]     Arrival date & time 12/05/15  1957 History   None    Chief Complaint  Patient presents with  . Sore Throat   (Consider location/radiation/quality/duration/timing/severity/associated sxs/prior Treatment) Patient is a 25 y.o. female presenting with URI. The history is provided by the patient.  URI Presenting symptoms: congestion, cough, rhinorrhea and sore throat   Severity:  Moderate Onset quality:  Sudden Timing:  Constant Progression:  Worsening Chronicity:  New Relieved by:  Nothing Ineffective treatments:  OTC medications Associated symptoms: wheezing   Associated symptoms: no headaches and no sinus pain   Risk factors: chronic respiratory disease (asthma)   Risk factors: not elderly, no chronic cardiac disease, no chronic kidney disease, no diabetes mellitus, no immunosuppression, no recent illness, no recent travel and no sick contacts     Past Medical History  Diagnosis Date  . MRSA (methicillin resistant staph aureus) culture positive   . Asthma   . Migraine   . Pyloric stenosis    History reviewed. No pertinent past surgical history. History reviewed. No pertinent family history. Social History  Substance Use Topics  . Smoking status: Never Smoker   . Smokeless tobacco: None  . Alcohol Use: No     Comment: rarely   OB History    No data available     Review of Systems  HENT: Positive for congestion, rhinorrhea and sore throat.   Respiratory: Positive for cough and wheezing.   Neurological: Negative for headaches.    Allergies  Cefprozil; Amoxil; and Augmentin  Home Medications   Prior to Admission medications   Medication Sig Start Date End Date Taking? Authorizing Provider  ADDERALL XR 20 MG 24 hr capsule Take 1 capsule by mouth 2 (two) times daily. 03/05/15  Yes Historical Provider, MD  albuterol (PROVENTIL HFA;VENTOLIN HFA) 108 (90 Base) MCG/ACT inhaler Inhale 2 puffs into the lungs every 4 (four) hours as needed for wheezing or  shortness of breath. 06/23/15  Yes Christiane HaJonathan D Cuthriell, PA-C  albuterol (PROVENTIL HFA;VENTOLIN HFA) 108 (90 Base) MCG/ACT inhaler Inhale 2 puffs into the lungs every 6 (six) hours as needed for wheezing or shortness of breath. 10/17/15  Yes Hassan RowanEugene Wade, MD  AMITIZA 8 MCG capsule Take 1 capsule by mouth 2 (two) times daily. 03/14/15  Yes Historical Provider, MD  baclofen (LIORESAL) 10 MG tablet Take 1-2 tablets by mouth at bedtime. 03/06/15  Yes Historical Provider, MD  beclomethasone (QVAR) 80 MCG/ACT inhaler Inhale 2 puffs into the lungs 2 (two) times daily.   Yes Historical Provider, MD  cetirizine (ZYRTEC) 10 MG tablet Take 1 tablet (10 mg total) by mouth daily. 06/23/15  Yes Christiane HaJonathan D Cuthriell, PA-C  clonazePAM (KLONOPIN) 1 MG tablet Take 1 mg by mouth 3 (three) times daily as needed for anxiety.   Yes Historical Provider, MD  diazepam (VALIUM) 10 MG tablet Take 10 mg by mouth daily.   Yes Historical Provider, MD  gabapentin (NEURONTIN) 300 MG capsule Take 1 capsule by mouth 2 (two) times daily. 03/13/15  Yes Historical Provider, MD  lamoTRIgine (LAMICTAL) 200 MG tablet Take 200 mg by mouth 2 (two) times daily.   Yes Historical Provider, MD  meloxicam (MOBIC) 15 MG tablet Take 1 tablet (15 mg total) by mouth daily. 10/17/15  Yes Hassan RowanEugene Wade, MD  nabumetone (RELAFEN) 750 MG tablet Take 1 tablet by mouth 2 (two) times daily. 03/06/15  Yes Historical Provider, MD  rizatriptan (MAXALT) 10 MG tablet Take 1 tablet by mouth every  2 (two) hours as needed. 03/06/15  Yes Historical Provider, MD  sertraline (ZOLOFT) 100 MG tablet Take 100 mg by mouth daily.   Yes Historical Provider, MD  topiramate (TOPAMAX) 100 MG tablet Take 100 mg by mouth at bedtime. 03/06/15  Yes Historical Provider, MD  azithromycin (ZITHROMAX Z-PAK) 250 MG tablet 2 tabs po once day 1, then 1 tab po qd for next 4 days 12/05/15   Payton Mccallum, MD  benzonatate (TESSALON PERLES) 100 MG capsule Take 1 capsule (100 mg total) by mouth every 6  (six) hours as needed for cough. 04/27/15 04/26/16  Rebecka Apley, MD  benzonatate (TESSALON) 100 MG capsule Take 1 capsule (100 mg total) by mouth every 8 (eight) hours. 10/17/15   Hassan Rowan, MD  cefdinir (OMNICEF) 300 MG capsule Take 1 capsule (300 mg total) by mouth 2 (two) times daily. 03/04/15   Payton Mccallum, MD  cefUROXime (CEFTIN) 250 MG tablet Take one tablet BID with food 10/06/15   Lutricia Feil, PA-C  chlorpheniramine-HYDROcodone [facility]) 10-8 MG/5ML SUER Take 5 mLs by mouth 2 (two) times daily. 07/01/15   Rebecka Apley, MD  doxycycline (VIBRA-TABS) 100 MG tablet Take 1 tablet (100 mg total) by mouth 2 (two) times daily. 06/23/15   Delorise Royals Cuthriell, PA-C  fexofenadine-pseudoephedrine (ALLEGRA-D ALLERGY & CONGESTION) 180-240 MG 24 hr tablet Take 1 tablet by mouth daily. 10/17/15   Hassan Rowan, MD  fluticasone (FLONASE) 50 MCG/ACT nasal spray Place 1 spray into both nostrils 2 (two) times daily. 06/23/15   Delorise Royals Cuthriell, PA-C  ibuprofen (ADVIL,MOTRIN) 800 MG tablet Take 1 tablet (800 mg total) by mouth every 8 (eight) hours as needed. 03/13/15   Ignacia Bayley, PA-C  ketorolac (ACULAR) 0.4 % SOLN Place 1 drop into both eyes daily. 03/04/15   Historical Provider, MD  loratadine (CLARITIN) 10 MG tablet Take 1 tablet by mouth daily. 03/13/15   Historical Provider, MD  metoprolol (TOPROL-XL) 200 MG 24 hr tablet Take 200 mg by mouth daily.    Historical Provider, MD  montelukast (SINGULAIR) 10 MG tablet Take 10 mg by mouth at bedtime.    Historical Provider, MD  ondansetron (ZOFRAN ODT) 4 MG disintegrating tablet Take 1 tablet (4 mg total) by mouth every 6 (six) hours as needed for nausea or vomiting. 04/10/15   Sharyn Creamer, MD  predniSONE (DELTASONE) 10 MG tablet Take 1 tablet (10 mg total) by mouth as directed. 06/23/15   Delorise Royals Cuthriell, PA-C  predniSONE (STERAPRED UNI-PAK 21 TAB) 10 MG (21) TBPK tablet Sig 6 tablet day 1, 5 tablets day 2, 4 tablets day  3,,3tablets day 4, 2 tablets day 5, 1 tablet day 6 take all tablets orally 10/17/15   Hassan Rowan, MD  sulfamethoxazole-trimethoprim (BACTRIM DS,SEPTRA DS) 800-160 MG tablet Take 1 tablet by mouth 2 (two) times daily. 04/30/15   Payton Mccallum, MD   Meds Ordered and Administered this Visit  Medications - No data to display  BP 140/76 mmHg  Temp(Src) 98.6 F (37 C) (Tympanic)  Resp 18  Ht  (1.702 m)  Wt 300 lb (136.079 kg)  BMI 46.98 kg/m2  SpO2 100%  LMP 12/05/2015 No data found.   Physical Exam  Constitutional: She appears well-developed and well-nourished. No distress.  HENT:  Head: Normocephalic and atraumatic.  Right Ear: External ear and ear canal normal. Tympanic membrane is erythematous and bulging. A middle ear effusion is present.  Left Ear: Tympanic membrane, external ear and ear canal  normal.  Nose: Mucosal edema and rhinorrhea present. No nose lacerations, sinus tenderness, nasal deformity, septal deviation or nasal septal hematoma. No epistaxis.  No foreign bodies. Right sinus exhibits maxillary sinus tenderness and frontal sinus tenderness. Left sinus exhibits maxillary sinus tenderness and frontal sinus tenderness.  Mouth/Throat: Uvula is midline, oropharynx is clear and moist and mucous membranes are normal. No oropharyngeal exudate.  Eyes: Conjunctivae and EOM are normal. Pupils are equal, round, and reactive to light. Right eye exhibits no discharge. Left eye exhibits no discharge. No scleral icterus.  Neck: Normal range of motion. Neck supple. No thyromegaly present.  Cardiovascular: Normal rate, regular rhythm and normal heart sounds.   Pulmonary/Chest: Effort normal and breath sounds normal. No respiratory distress. She has no wheezes. She has no rales.  Lymphadenopathy:    She has no cervical adenopathy.  Skin: She is not diaphoretic.  Nursing note and vitals reviewed.   ED Course  Procedures (including critical care time)  Labs Review Labs Reviewed   RAPID STREP SCREEN (NOT AT [facility])  CULTURE, GROUP A STREP [facility])    Imaging Review No results found.   Visual Acuity Review  Right Eye Distance:   Left Eye Distance:   Bilateral Distance:    Right Eye Near:   Left Eye Near:    Bilateral Near:         MDM   1. Right acute serous otitis media, recurrence not specified    Discharge Medication List as of 12/05/2015  8:42 PM     1. diagnosis reviewed with patient 2. rx as per orders above; reviewed possible side effects, interactions, risks and benefits  3. Follow-up prn if symptoms worsen or don't improve    Payton Mccallumrlando Reha Martinovich, MD 12/06/15 [phone]

## 2015-12-08 LAB — CULTURE, GROUP A STREP (THRC)

## 2016-01-25 ENCOUNTER — Encounter: Payer: Self-pay | Admitting: Emergency Medicine

## 2016-01-25 ENCOUNTER — Ambulatory Visit
Admission: EM | Admit: 2016-01-25 | Discharge: 2016-01-25 | Disposition: A | Discharge status: Home / Self Care | Payer: Medicaid Other | Attending: Family Medicine | Admitting: Family Medicine

## 2016-01-25 DIAGNOSIS — G43909 Migraine, unspecified, not intractable, without status migrainosus: Secondary | ICD-10-CM | POA: Insufficient documentation

## 2016-01-25 DIAGNOSIS — J029 Acute pharyngitis, unspecified: Secondary | ICD-10-CM | POA: Diagnosis not present

## 2016-01-25 DIAGNOSIS — Z880 Allergy status to penicillin: Secondary | ICD-10-CM | POA: Diagnosis not present

## 2016-01-25 DIAGNOSIS — J45909 Unspecified asthma, uncomplicated: Secondary | ICD-10-CM | POA: Insufficient documentation

## 2016-01-25 DIAGNOSIS — J069 Acute upper respiratory infection, unspecified: Secondary | ICD-10-CM | POA: Diagnosis not present

## 2016-01-25 LAB — RAPID STREP SCREEN (MED CTR MEBANE ONLY): STREPTOCOCCUS, GROUP A SCREEN (DIRECT): NEGATIVE

## 2016-01-25 MED ORDER — LEVOFLOXACIN 500 MG PO TABS: 500.0000 mg | ORAL_TABLET | Freq: Every day | ORAL | 0 refills | Status: DC

## 2016-01-25 NOTE — ED Triage Notes (Signed)
Patient c/o sore throat that started 2 days ago.  Patient reports runny nose, congestion and ear pain that started 3 weeks ago.

## 2016-01-25 NOTE — Discharge Instructions (Signed)
Stop current antibiotic and switched to Levaquin. Recommend patient follow up with primary care physician this week if symptoms do persist or worsen. Oral antihistamine when necessary for nasal drainage and cough suppressant when necessary.

## 2016-01-25 NOTE — ED Provider Notes (Signed)
[facility]    CSN: [id number] Arrival date & time: 01/25/16  1502  First Provider Contact:  None       History   Chief Complaint No chief complaint on file.   HPI Yvonne Rios is a 25 y.o. female.   HPI:  Patient presents today with symptoms of mild productive cough and nasal congestion and sore throat. Patient states that she is currently on Biaxin however has not been feeling any better on the medication. She requests that her antibiotic be changed. Patient is allergic to Amoxicillin. She denies any high fevers, chest pain, shortness of breath, diarrhea, abdominal pain. She states her last menstrual period was at the beginning of August.   Past Medical History:  Diagnosis Date  . Asthma   . Migraine   . MRSA (methicillin resistant staph aureus) culture positive   . Pyloric stenosis     There are no active problems to display for this patient.   No past surgical history on file.  OB History    No data available       Home Medications    Prior to Admission medications   Medication Sig Start Date End Date Taking? Authorizing Provider  ADDERALL XR 20 MG 24 hr capsule Take 1 capsule by mouth 2 (two) times daily. 03/05/15   Historical Provider, MD  albuterol (PROVENTIL HFA;VENTOLIN HFA) 108 (90 Base) MCG/ACT inhaler Inhale 2 puffs into the lungs every 4 (four) hours as needed for wheezing or shortness of breath. 06/23/15   Delorise RoyalsJonathan D Cuthriell, PA-C  albuterol (PROVENTIL HFA;VENTOLIN HFA) 108 (90 Base) MCG/ACT inhaler Inhale 2 puffs into the lungs every 6 (six) hours as needed for wheezing or shortness of breath. 10/17/15   Hassan RowanEugene Wade, MD  AMITIZA 8 MCG capsule Take 1 capsule by mouth 2 (two) times daily. 03/14/15   Historical Provider, MD  azithromycin (ZITHROMAX Z-PAK) 250 MG tablet 2 tabs po once day 1, then 1 tab po qd for next 4 days 12/05/15   Payton Mccallumrlando Conty, MD  baclofen (LIORESAL) 10 MG tablet Take 1-2 tablets by mouth at bedtime. 03/06/15    Historical Provider, MD  beclomethasone (QVAR) 80 MCG/ACT inhaler Inhale 2 puffs into the lungs 2 (two) times daily.    Historical Provider, MD  benzonatate (TESSALON PERLES) 100 MG capsule Take 1 capsule (100 mg total) by mouth every 6 (six) hours as needed for cough. 04/27/15 04/26/16  Rebecka ApleyAllison P Webster, MD  benzonatate (TESSALON) 100 MG capsule Take 1 capsule (100 mg total) by mouth every 8 (eight) hours. 10/17/15   Hassan RowanEugene Wade, MD  cefdinir (OMNICEF) 300 MG capsule Take 1 capsule (300 mg total) by mouth 2 (two) times daily. 03/04/15   Payton Mccallumrlando Conty, MD  cefUROXime (CEFTIN) 250 MG tablet Take one tablet BID with food 10/06/15   Lutricia FeilWilliam P Roemer, PA-C  cetirizine (ZYRTEC) 10 MG tablet Take 1 tablet (10 mg total) by mouth daily. 06/23/15   Delorise RoyalsJonathan D Cuthriell, PA-C  chlorpheniramine-HYDROcodone (TUSSIONEX PENNKINETIC ER) 10-8 MG/5ML SUER Take 5 mLs by mouth 2 (two) times daily. 07/01/15   Rebecka ApleyAllison P Webster, MD  clonazePAM (KLONOPIN) 1 MG tablet Take 1 mg by mouth 3 (three) times daily as needed for anxiety.    Historical Provider, MD  diazepam (VALIUM) 10 MG tablet Take 10 mg by mouth daily.    Historical Provider, MD  doxycycline (VIBRA-TABS) 100 MG tablet Take 1 tablet (100 mg total) by mouth 2 (two) times daily. 06/23/15   Delorise RoyalsJonathan D  Cuthriell, PA-C  fexofenadine-pseudoephedrine (ALLEGRA-D ALLERGY & CONGESTION) 180-240 MG 24 hr tablet Take 1 tablet by mouth daily. 10/17/15   Hassan RowanEugene Wade, MD  fluticasone (FLONASE) 50 MCG/ACT nasal spray Place 1 spray into both nostrils 2 (two) times daily. 06/23/15   Delorise RoyalsJonathan D Cuthriell, PA-C  gabapentin (NEURONTIN) 300 MG capsule Take 1 capsule by mouth 2 (two) times daily. 03/13/15   Historical Provider, MD  ibuprofen (ADVIL,MOTRIN) 800 MG tablet Take 1 tablet (800 mg total) by mouth every 8 (eight) hours as needed. 03/13/15   Ignacia Bayleyobert Tumey, PA-C  ketorolac (ACULAR) 0.4 % SOLN Place 1 drop into both eyes daily. 03/04/15   Historical Provider, MD  lamoTRIgine (LAMICTAL) 200  MG tablet Take 200 mg by mouth 2 (two) times daily.    Historical Provider, MD  loratadine (CLARITIN) 10 MG tablet Take 1 tablet by mouth daily. 03/13/15   Historical Provider, MD  meloxicam (MOBIC) 15 MG tablet Take 1 tablet (15 mg total) by mouth daily. 10/17/15   Hassan RowanEugene Wade, MD  metoprolol (TOPROL-XL) 200 MG 24 hr tablet Take 200 mg by mouth daily.    Historical Provider, MD  montelukast (SINGULAIR) 10 MG tablet Take 10 mg by mouth at bedtime.    Historical Provider, MD  nabumetone (RELAFEN) 750 MG tablet Take 1 tablet by mouth 2 (two) times daily. 03/06/15   Historical Provider, MD  ondansetron (ZOFRAN ODT) 4 MG disintegrating tablet Take 1 tablet (4 mg total) by mouth every 6 (six) hours as needed for nausea or vomiting. 04/10/15   Sharyn CreamerMark Quale, MD  predniSONE (DELTASONE) 10 MG tablet Take 1 tablet (10 mg total) by mouth as directed. 06/23/15   Delorise RoyalsJonathan D Cuthriell, PA-C  predniSONE (STERAPRED UNI-PAK 21 TAB) 10 MG (21) TBPK tablet Sig 6 tablet day 1, 5 tablets day 2, 4 tablets day 3,,3tablets day 4, 2 tablets day 5, 1 tablet day 6 take all tablets orally 10/17/15   Hassan RowanEugene Wade, MD  rizatriptan (MAXALT) 10 MG tablet Take 1 tablet by mouth every 2 (two) hours as needed. 03/06/15   Historical Provider, MD  sertraline (ZOLOFT) 100 MG tablet Take 100 mg by mouth daily.    Historical Provider, MD  sulfamethoxazole-trimethoprim (BACTRIM DS,SEPTRA DS) 800-160 MG tablet Take 1 tablet by mouth 2 (two) times daily. 04/30/15   Payton Mccallumrlando Conty, MD  topiramate (TOPAMAX) 100 MG tablet Take 100 mg by mouth at bedtime. 03/06/15   Historical Provider, MD    Family History No family history on file.  Social History Social History  Substance Use Topics  . Smoking status: Never Smoker  . Smokeless tobacco: Not on file  . Alcohol use No     Comment: rarely     Allergies   Cefprozil; Amoxil [amoxicillin]; and Augmentin [amoxicillin-pot clavulanate]   Review of Systems Review of Systems: Negative except  mentioned above.    Physical Exam Triage Vital Signs ED Triage Vitals  Enc Vitals Group     BP      Pulse      Resp      Temp      Temp src      SpO2      Weight      Height      Head Circumference      Peak Flow      Pain Score      Pain Loc      Pain Edu?      Excl. in GC?    No data found.  Updated Vital Signs There were no vitals taken for this visit.     Physical Exam:  GENERAL: NAD HEENT: mild to moderate pharyngeal erythema, no exudate, mild erythema of left TM, no bulging, no erythema or bulging of right TM, no significant cervical lymphadenopathy appreciated RESP: CTA B, no accessory muscle use, no tachypnea appreciated CARD: RRR NEURO: CN II-XII grossly intact    UC Treatments / Results  Labs (all labs ordered are listed, but only abnormal results are displayed) Labs Reviewed - No data to display  EKG  EKG Interpretation None       Radiology No results found.  Procedures Procedures (including critical care time)  Medications Ordered in UC Medications - No data to display   Initial Impression / Assessment and Plan / UC Course  I have reviewed the triage vital signs and the nursing notes.  Pertinent labs & imaging results that were available during my care of the patient were reviewed by me and considered in my medical decision making (see chart for details).  Clinical Course   A/P: URI - rapid strep test was negative, patient prescribed Levaquin, advised patient to stop other antibiotic, oral antihistamine when necessary for postnasal drip, oral cough suppressant when necessary, rest, hydration, continue to monitor symptoms if any persistent or worsening symptoms seek medical attention as discussed.  Final Clinical Impressions(s) / UC Diagnoses   Final diagnoses:  None    New Prescriptions New Prescriptions   No medications on file     Jolene Provost, MD 01/25/16 1549

## 2016-01-28 LAB — CULTURE, GROUP A STREP (THRC)

## 2016-01-31 ENCOUNTER — Telehealth: Payer: Self-pay | Admitting: *Deleted

## 2016-01-31 NOTE — Telephone Encounter (Signed)
Called patient and informed her that her strep culture came back negative. Patient confirmed understanding of information. 

## 2016-02-08 ENCOUNTER — Ambulatory Visit
Admission: EM | Admit: 2016-02-08 | Discharge: 2016-02-08 | Disposition: A | Discharge status: Home / Self Care | Payer: Medicaid Other | Attending: Family Medicine | Admitting: Family Medicine

## 2016-02-08 ENCOUNTER — Encounter: Payer: Self-pay | Admitting: Emergency Medicine

## 2016-02-08 DIAGNOSIS — Z880 Allergy status to penicillin: Secondary | ICD-10-CM | POA: Diagnosis not present

## 2016-02-08 DIAGNOSIS — R300 Dysuria: Secondary | ICD-10-CM | POA: Diagnosis not present

## 2016-02-08 DIAGNOSIS — Z79899 Other long term (current) drug therapy: Secondary | ICD-10-CM | POA: Diagnosis not present

## 2016-02-08 LAB — URINALYSIS COMPLETE WITH MICROSCOPIC (ARMC ONLY)
BILIRUBIN URINE: NEGATIVE
Bacteria, UA: NONE SEEN
Glucose, UA: NEGATIVE mg/dL
KETONES UR: NEGATIVE mg/dL
LEUKOCYTES UA: NEGATIVE
Nitrite: NEGATIVE
PROTEIN: NEGATIVE mg/dL
SPECIFIC GRAVITY, URINE: 1.01 (ref 1.005–1.030)
pH: 5.5 (ref 5.0–8.0)

## 2016-02-08 LAB — PREGNANCY, URINE: Preg Test, Ur: NEGATIVE

## 2016-02-08 MED ORDER — SULFAMETHOXAZOLE-TRIMETHOPRIM 800-160 MG PO TABS: 1.0000 | ORAL_TABLET | Freq: Two times a day (BID) | ORAL | 0 refills | Status: DC

## 2016-02-08 NOTE — ED Provider Notes (Signed)
[facility]    CSN: [id number] Arrival date & time: 02/08/16  1526  First Provider Contact:  First MD Initiated Contact with Patient 02/08/16 1627        History   Chief Complaint Chief Complaint  Patient presents with  . Dysuria    HPI Yvonne Rios is a 25 y.o. female.   The history is provided by the patient.  Dysuria  Pain quality:  Burning Pain severity:  Mild Onset quality:  Sudden Duration:  7 days Timing:  Constant Progression:  Unchanged Chronicity:  New Recent urinary tract infections: no   Relieved by:  None tried Worsened by:  Nothing Urinary symptoms: discolored urine, foul-smelling urine and frequent urination   Urinary symptoms: no hematuria, no hesitancy and no bladder incontinence   Associated symptoms: no abdominal pain, no fever, no flank pain, no genital lesions, no nausea, no vaginal discharge and no vomiting   Risk factors: no hx of pyelonephritis, no hx of urolithiasis, no kidney transplant, not pregnant, no recurrent urinary tract infections, no renal cysts, no renal disease, no single kidney and no urinary catheter     Past Medical History:  Diagnosis Date  . Asthma   . Migraine   . MRSA (methicillin resistant staph aureus) culture positive   . Pyloric stenosis     There are no active problems to display for this patient.   History reviewed. No pertinent surgical history.  OB History    No data available       Home Medications    Prior to Admission medications   Medication Sig Start Date End Date Taking? Authorizing Provider  ADDERALL XR 20 MG 24 hr capsule Take 1 capsule by mouth 2 (two) times daily. 03/05/15   Historical Provider, MD  albuterol (PROVENTIL HFA;VENTOLIN HFA) 108 (90 Base) MCG/ACT inhaler Inhale 2 puffs into the lungs every 4 (four) hours as needed for wheezing or shortness of breath. 06/23/15   Delorise Royals Cuthriell, PA-C  albuterol (PROVENTIL HFA;VENTOLIN HFA) 108 (90 Base) MCG/ACT inhaler Inhale  2 puffs into the lungs every 6 (six) hours as needed for wheezing or shortness of breath. 10/17/15   Hassan Rowan, MD  AMITIZA 8 MCG capsule Take 1 capsule by mouth 2 (two) times daily. 03/14/15   Historical Provider, MD  baclofen (LIORESAL) 10 MG tablet Take 1-2 tablets by mouth at bedtime. 03/06/15   Historical Provider, MD  beclomethasone (QVAR) 80 MCG/ACT inhaler Inhale 2 puffs into the lungs 2 (two) times daily.    Historical Provider, MD  benzonatate (TESSALON PERLES) 100 MG capsule Take 1 capsule (100 mg total) by mouth every 6 (six) hours as needed for cough. 04/27/15 04/26/16  Rebecka Apley, MD  benzonatate (TESSALON) 100 MG capsule Take 1 capsule (100 mg total) by mouth every 8 (eight) hours. 10/17/15   Hassan Rowan, MD  cetirizine (ZYRTEC) 10 MG tablet Take 1 tablet (10 mg total) by mouth daily. 06/23/15   Delorise Royals Cuthriell, PA-C  chlorpheniramine-HYDROcodone (TUSSIONEX PENNKINETIC ER) 10-8 MG/5ML SUER Take 5 mLs by mouth 2 (two) times daily. 07/01/15   Rebecka Apley, MD  clonazePAM (KLONOPIN) 1 MG tablet Take 1 mg by mouth 3 (three) times daily as needed for anxiety.    Historical Provider, MD  diazepam (VALIUM) 10 MG tablet Take 10 mg by mouth daily.    Historical Provider, MD  fexofenadine-pseudoephedrine (ALLEGRA-D ALLERGY & CONGESTION) 180-240 MG 24 hr tablet Take 1 tablet by mouth daily. 10/17/15   Dennard Nip  Thurmond Butts, MD  fluticasone (FLONASE) 50 MCG/ACT nasal spray Place 1 spray into both nostrils 2 (two) times daily. 06/23/15   Delorise Royals Cuthriell, PA-C  gabapentin (NEURONTIN) 300 MG capsule Take 1 capsule by mouth 2 (two) times daily. 03/13/15   Historical Provider, MD  ibuprofen (ADVIL,MOTRIN) 800 MG tablet Take 1 tablet (800 mg total) by mouth every 8 (eight) hours as needed. 03/13/15   Ignacia Bayley, PA-C  ketorolac (ACULAR) 0.4 % SOLN Place 1 drop into both eyes daily. 03/04/15   Historical Provider, MD  lamoTRIgine (LAMICTAL) 200 MG tablet Take 200 mg by mouth 2 (two) times daily.     Historical Provider, MD  levofloxacin (LEVAQUIN) 500 MG tablet Take 1 tablet (500 mg total) by mouth daily. 01/25/16   Jolene Provost, MD  loratadine (CLARITIN) 10 MG tablet Take 1 tablet by mouth daily. 03/13/15   Historical Provider, MD  meloxicam (MOBIC) 15 MG tablet Take 1 tablet (15 mg total) by mouth daily. 10/17/15   Hassan Rowan, MD  metoprolol (TOPROL-XL) 200 MG 24 hr tablet Take 200 mg by mouth daily.    Historical Provider, MD  montelukast (SINGULAIR) 10 MG tablet Take 10 mg by mouth at bedtime.    Historical Provider, MD  nabumetone (RELAFEN) 750 MG tablet Take 1 tablet by mouth 2 (two) times daily. 03/06/15   Historical Provider, MD  ondansetron (ZOFRAN ODT) 4 MG disintegrating tablet Take 1 tablet (4 mg total) by mouth every 6 (six) hours as needed for nausea or vomiting. 04/10/15   Sharyn Creamer, MD  rizatriptan (MAXALT) 10 MG tablet Take 1 tablet by mouth every 2 (two) hours as needed. 03/06/15   Historical Provider, MD  sertraline (ZOLOFT) 100 MG tablet Take 100 mg by mouth daily.    Historical Provider, MD  sulfamethoxazole-trimethoprim (BACTRIM DS,SEPTRA DS) 800-160 MG tablet Take 1 tablet by mouth 2 (two) times daily. 02/08/16   Payton Mccallum, MD  topiramate (TOPAMAX) 100 MG tablet Take 100 mg by mouth at bedtime. 03/06/15   Historical Provider, MD    Family History History reviewed. No pertinent family history.  Social History Social History  Substance Use Topics  . Smoking status: Never Smoker  . Smokeless tobacco: Never Used  . Alcohol use No     Comment: rarely     Allergies   Cefprozil; Amoxil [amoxicillin]; and Augmentin [amoxicillin-pot clavulanate]   Review of Systems Review of Systems  Constitutional: Negative for fever.  Gastrointestinal: Negative for abdominal pain, nausea and vomiting.  Genitourinary: Positive for dysuria. Negative for flank pain and vaginal discharge.     Physical Exam Triage Vital Signs ED Triage Vitals  Enc Vitals Group     BP 02/08/16  1546 121/62     Pulse Rate 02/08/16 1546 62     Resp 02/08/16 1546 18     Temp 02/08/16 1546 99.3 F (37.4 C)     Temp Source 02/08/16 1546 Oral     SpO2 02/08/16 1546 99 %     Weight 02/08/16 1545 (!) 330 lb (149.7 kg)     Height 02/08/16 1545 5\' 8"  (1.727 m)     Head Circumference --      Peak Flow --      Pain Score 02/08/16 1548 7     Pain Loc --      Pain Edu? --      Excl. in GC? --    No data found.   Updated Vital Signs BP 121/62 (BP Location: Left Arm)  Pulse 62   Temp 99.3 F (37.4 C) (Oral)   Resp 18   Ht 5\' 8"  (1.727 m)   Wt (!) 330 lb (149.7 kg)   LMP 01/07/2016 (Exact Date)   SpO2 99%   BMI 50.18 kg/m   Visual Acuity Right Eye Distance:   Left Eye Distance:   Bilateral Distance:    Right Eye Near:   Left Eye Near:    Bilateral Near:     Physical Exam  Constitutional: She appears well-developed and well-nourished. No distress.  Abdominal: Soft. Bowel sounds are normal. She exhibits no distension and no mass. There is tenderness (suprapubic, mild). There is no rebound and no guarding.  Skin: She is not diaphoretic.  Nursing note and vitals reviewed.    UC Treatments / Results  Labs (all labs ordered are listed, but only abnormal results are displayed) Labs Reviewed  URINALYSIS COMPLETEWITH MICROSCOPIC ([facility] ONLY) - Abnormal; Notable for the following:       Result Value   Color, Urine STRAW (*)    Hgb urine dipstick TRACE (*)    Squamous Epithelial / LPF 0-5 (*)    All other components within normal limits  URINE CULTURE  PREGNANCY, URINE    EKG  EKG Interpretation None       Radiology No results found.  Procedures Procedures (including critical care time)  Medications Ordered in UC Medications - No data to display   Initial Impression / Assessment and Plan / UC Course  I have reviewed the triage vital signs and the nursing notes.  Pertinent labs & imaging results that were available during my care of the patient were  reviewed by me and considered in my medical decision making (see chart for details).  Clinical Course      Final Clinical Impressions(s) / UC Diagnoses   Final diagnoses:  Dysuria    New Prescriptions Discharge Medication List as of 02/08/2016  4:35 PM    START taking these medications   Details  sulfamethoxazole-trimethoprim (BACTRIM DS,SEPTRA DS) 800-160 MG tablet Take 1 tablet by mouth 2 (two) times daily., Starting Sun 02/08/2016, Normal       1.  diagnosis reviewed with patient 2. rx as per orders above; reviewed possible side effects, interactions, risks and benefits  3. Recommend supportive treatment with increased fluids 4. Follow-up prn if symptoms worsen or don't improve   Payton Mccallumrlando Iori Gigante, MD 02/08/16 1640

## 2016-02-08 NOTE — ED Triage Notes (Signed)
Patient c/o burning when urinating and urinary frequency that started a week ago.  

## 2016-02-11 LAB — URINE CULTURE

## 2016-02-17 ENCOUNTER — Ambulatory Visit
Admission: EM | Admit: 2016-02-17 | Discharge: 2016-02-17 | Disposition: A | Discharge status: Home / Self Care | Payer: Medicaid Other | Attending: Family Medicine | Admitting: Family Medicine

## 2016-02-17 DIAGNOSIS — K529 Noninfective gastroenteritis and colitis, unspecified: Secondary | ICD-10-CM | POA: Diagnosis not present

## 2016-02-17 MED ORDER — ONDANSETRON 8 MG PO TBDP
8.0000 mg | ORAL_TABLET | Freq: Once | ORAL | Status: AC
  Administered 2016-02-17: 8 mg via ORAL

## 2016-02-17 MED ORDER — ONDANSETRON 8 MG PO TBDP: 8.0000 mg | ORAL_TABLET | Freq: Two times a day (BID) | ORAL | 0 refills | Status: DC

## 2016-02-17 MED ORDER — ONDANSETRON 8 MG PO TBDP: 4.0000 mg | ORAL_TABLET | Freq: Four times a day (QID) | ORAL | 0 refills | Status: DC | PRN

## 2016-02-17 NOTE — ED Provider Notes (Signed)
CSN: [id number]     Arrival date & time 02/17/16  1033 History   None    Chief Complaint  Patient presents with  . Emesis   (Consider location/radiation/quality/duration/timing/severity/associated sxs/prior Treatment) HPI  This a 25 year old female diabetic morbidly obese who presents with the onset of sinus pain and pressure yesterday and last night and this morning with onset of nausea and vomiting with diarrhea 3. Son had been sick earlier in the week and has improved over several days. States that she has no blood or mucus in the stool. Some mild pain that she complains of on the right mid quadrant. There is no Radiation. The pain is basically colicky.       Past Medical History:  Diagnosis Date  . Asthma   . Migraine   . MRSA (methicillin resistant staph aureus) culture positive   . Pyloric stenosis    History reviewed. No pertinent surgical history. History reviewed. No pertinent family history. Social History  Substance Use Topics  . Smoking status: Never Smoker  . Smokeless tobacco: Never Used  . Alcohol use No     Comment: rarely   OB History    No data available     Review of Systems  Constitutional: Positive for activity change, appetite change, chills, fatigue and fever.  HENT: Positive for congestion, postnasal drip, rhinorrhea and sinus pressure.   Gastrointestinal: Positive for abdominal pain, diarrhea, nausea and vomiting. Negative for blood in stool and constipation.  All other systems reviewed and are negative.   Allergies  Cefprozil; Amoxil [amoxicillin]; and Augmentin [amoxicillin-pot clavulanate]  Home Medications   Prior to Admission medications   Medication Sig Start Date End Date Taking? Authorizing Provider  ADDERALL XR 20 MG 24 hr capsule Take 1 capsule by mouth 2 (two) times daily. 03/05/15   Historical Provider, MD  albuterol (PROVENTIL HFA;VENTOLIN HFA) 108 (90 Base) MCG/ACT inhaler Inhale 2 puffs into the lungs every 4 (four) hours as  needed for wheezing or shortness of breath. 06/23/15   Delorise Royals Cuthriell, PA-C  albuterol (PROVENTIL HFA;VENTOLIN HFA) 108 (90 Base) MCG/ACT inhaler Inhale 2 puffs into the lungs every 6 (six) hours as needed for wheezing or shortness of breath. 10/17/15   Hassan Rowan, MD  AMITIZA 8 MCG capsule Take 1 capsule by mouth 2 (two) times daily. 03/14/15   Historical Provider, MD  baclofen (LIORESAL) 10 MG tablet Take 1-2 tablets by mouth at bedtime. 03/06/15   Historical Provider, MD  beclomethasone (QVAR) 80 MCG/ACT inhaler Inhale 2 puffs into the lungs 2 (two) times daily.    Historical Provider, MD  benzonatate (TESSALON PERLES) 100 MG capsule Take 1 capsule (100 mg total) by mouth every 6 (six) hours as needed for cough. 04/27/15 04/26/16  Rebecka Apley, MD  benzonatate (TESSALON) 100 MG capsule Take 1 capsule (100 mg total) by mouth every 8 (eight) hours. 10/17/15   Hassan Rowan, MD  cetirizine (ZYRTEC) 10 MG tablet Take 1 tablet (10 mg total) by mouth daily. 06/23/15   Delorise Royals Cuthriell, PA-C  chlorpheniramine-HYDROcodone (TUSSIONEX PENNKINETIC ER) 10-8 MG/5ML SUER Take 5 mLs by mouth 2 (two) times daily. 07/01/15   Rebecka Apley, MD  clonazePAM (KLONOPIN) 1 MG tablet Take 1 mg by mouth 3 (three) times daily as needed for anxiety.    Historical Provider, MD  diazepam (VALIUM) 10 MG tablet Take 10 mg by mouth daily.    Historical Provider, MD  fexofenadine-pseudoephedrine (ALLEGRA-D ALLERGY & CONGESTION) 180-240 MG 24 hr tablet Take  1 tablet by mouth daily. 10/17/15   Hassan Rowan, MD  fluticasone (FLONASE) 50 MCG/ACT nasal spray Place 1 spray into both nostrils 2 (two) times daily. 06/23/15   Delorise Royals Cuthriell, PA-C  gabapentin (NEURONTIN) 300 MG capsule Take 1 capsule by mouth 2 (two) times daily. 03/13/15   Historical Provider, MD  ibuprofen (ADVIL,MOTRIN) 800 MG tablet Take 1 tablet (800 mg total) by mouth every 8 (eight) hours as needed. 03/13/15   Ignacia Bayley, PA-C  ketorolac (ACULAR) 0.4 %  SOLN Place 1 drop into both eyes daily. 03/04/15   Historical Provider, MD  lamoTRIgine (LAMICTAL) 200 MG tablet Take 200 mg by mouth 2 (two) times daily.    Historical Provider, MD  levofloxacin (LEVAQUIN) 500 MG tablet Take 1 tablet (500 mg total) by mouth daily. 01/25/16   Jolene Provost, MD  loratadine (CLARITIN) 10 MG tablet Take 1 tablet by mouth daily. 03/13/15   Historical Provider, MD  meloxicam (MOBIC) 15 MG tablet Take 1 tablet (15 mg total) by mouth daily. 10/17/15   Hassan Rowan, MD  metoprolol (TOPROL-XL) 200 MG 24 hr tablet Take 200 mg by mouth daily.    Historical Provider, MD  montelukast (SINGULAIR) 10 MG tablet Take 10 mg by mouth at bedtime.    Historical Provider, MD  nabumetone (RELAFEN) 750 MG tablet Take 1 tablet by mouth 2 (two) times daily. 03/06/15   Historical Provider, MD  ondansetron (ZOFRAN ODT) 8 MG disintegrating tablet Take 1 tablet (8 mg total) by mouth 2 (two) times daily. 02/17/16   Lutricia Feil, PA-C  rizatriptan (MAXALT) 10 MG tablet Take 1 tablet by mouth every 2 (two) hours as needed. 03/06/15   Historical Provider, MD  sertraline (ZOLOFT) 100 MG tablet Take 100 mg by mouth daily.    Historical Provider, MD  sulfamethoxazole-trimethoprim (BACTRIM DS,SEPTRA DS) 800-160 MG tablet Take 1 tablet by mouth 2 (two) times daily. 02/08/16   Payton Mccallum, MD  topiramate (TOPAMAX) 100 MG tablet Take 100 mg by mouth at bedtime. 03/06/15   Historical Provider, MD   Meds Ordered and Administered this Visit   Medications  ondansetron (ZOFRAN-ODT) disintegrating tablet 8 mg (8 mg Oral Given 02/17/16 1129)    BP (!) 156/74 (BP Location: Left Arm)   Pulse 60   Temp 98 F (36.7 C) (Oral)   Resp 17   Ht 5\' 8"  (1.727 m)   Wt (!) 330 lb (149.7 kg)   LMP 01/07/2016 (Exact Date)   SpO2 97%   BMI 50.18 kg/m  No data found.   Physical Exam  Constitutional: She appears well-developed and well-nourished. No distress.  HENT:  Head: Normocephalic and atraumatic.  Eyes: EOM  are normal. Pupils are equal, round, and reactive to light.  Neck: Normal range of motion. Neck supple.  Pulmonary/Chest: Effort normal and breath sounds normal. No respiratory distress. She has no wheezes. She has no rales.  Abdominal: Soft. She exhibits no distension and no mass. There is tenderness. There is no rebound and no guarding.  Examination of the abdomen shows the patient be morbidly obese. He has tenderness in the right mid quadrant. No guarding no masses are palpable. Bowel sounds are mildly hypotonic but present. There is no epigastric pain there is a negative Murphy's sign.  Skin: She is not diaphoretic.  Nursing note and vitals reviewed.   Urgent Care Course   Clinical Course    Procedures (including critical care time)  Labs Review Labs Reviewed - No data to display  Imaging Review No results found.   Visual Acuity Review  Right Eye Distance:   Left Eye Distance:   Bilateral Distance:    Right Eye Near:   Left Eye Near:    Bilateral Near:     Medications  ondansetron (ZOFRAN-ODT) disintegrating tablet 8 mg (8 mg Oral Given 02/17/16 1129)  Patient had less nausea following the Zofran dose and was able to tolerate liquids without difficulty. She asked to be discharged.    MDM   1. Gastroenteritis    Discharge Medication List as of 02/17/2016 11:54 AM    Plan: 1. Test/x-ray results and diagnosis reviewed with patient 2. rx as per orders; risks, benefits, potential side effects reviewed with patient 3. Recommend supportive treatment with Food intake to prevent dehydration. She does return to eating food she should start with a brat diet and advance as tolerated. If she has more so belly pain runs a high fever or is not improving or worsening she should go to emergency room. Otherwise she should follow-up with her primary care physician 4. F/u prn if symptoms worsen or don't improve     Lutricia FeilWilliam P Mikiyah Glasner, PA-C 02/17/16 1202

## 2016-02-17 NOTE — ED Triage Notes (Signed)
Patient complains of having sinus pain and pressure since yesterday, reports that her son has been sick recently too. Patient states that she has been fatigued and has been having nausea and vomiting with diarrhea since she woke up this morning.

## 2016-02-27 ENCOUNTER — Encounter: Payer: Self-pay | Admitting: Emergency Medicine

## 2016-02-27 ENCOUNTER — Emergency Department
Admission: EM | Admit: 2016-02-27 | Discharge: 2016-02-27 | Disposition: A | Discharge status: Home / Self Care | Payer: Medicaid Other | Attending: Emergency Medicine | Admitting: Emergency Medicine

## 2016-02-27 DIAGNOSIS — R300 Dysuria: Secondary | ICD-10-CM | POA: Diagnosis present

## 2016-02-27 DIAGNOSIS — N390 Urinary tract infection, site not specified: Secondary | ICD-10-CM | POA: Diagnosis not present

## 2016-02-27 DIAGNOSIS — J45909 Unspecified asthma, uncomplicated: Secondary | ICD-10-CM | POA: Diagnosis not present

## 2016-02-27 DIAGNOSIS — Z79899 Other long term (current) drug therapy: Secondary | ICD-10-CM | POA: Insufficient documentation

## 2016-02-27 DIAGNOSIS — B373 Candidiasis of vulva and vagina: Secondary | ICD-10-CM | POA: Insufficient documentation

## 2016-02-27 DIAGNOSIS — B3731 Acute candidiasis of vulva and vagina: Secondary | ICD-10-CM

## 2016-02-27 LAB — CHLAMYDIA/NGC RT PCR (ARMC ONLY)
Chlamydia Tr: NOT DETECTED
N GONORRHOEAE: NOT DETECTED

## 2016-02-27 LAB — URINALYSIS COMPLETE WITH MICROSCOPIC (ARMC ONLY)
Bilirubin Urine: NEGATIVE
GLUCOSE, UA: NEGATIVE mg/dL
Ketones, ur: NEGATIVE mg/dL
NITRITE: NEGATIVE
Protein, ur: 30 mg/dL — AB
SPECIFIC GRAVITY, URINE: 1.018 (ref 1.005–1.030)
pH: 6 (ref 5.0–8.0)

## 2016-02-27 LAB — WET PREP, GENITAL
CLUE CELLS WET PREP: NONE SEEN
Sperm: NONE SEEN
TRICH WET PREP: NONE SEEN
Yeast Wet Prep HPF POC: NONE SEEN

## 2016-02-27 MED ORDER — CIPROFLOXACIN HCL 500 MG PO TABS: 500.0000 mg | ORAL_TABLET | Freq: Two times a day (BID) | ORAL | 0 refills | Status: DC

## 2016-02-27 MED ORDER — FLUCONAZOLE 150 MG PO TABS: 150.0000 mg | ORAL_TABLET | Freq: Every day | ORAL | 0 refills | Status: DC

## 2016-02-27 MED ORDER — MICONAZOLE NITRATE 4 % VA CREA: 3.0000 | TOPICAL_CREAM | Freq: Once | VAGINAL | 0 refills | Status: AC

## 2016-02-27 NOTE — ED Triage Notes (Signed)
Pt ambulatory to triage with no difficulty. Pt reports vaginal itching and irritation for about a week and burning with urination. Pt was seen at urgent care about 2 weeks ago for a possible uti and was given a 3 day course of bactrim which she finished. Pt denies fever. Pt denies vaginal discharge.

## 2016-02-27 NOTE — ED Provider Notes (Signed)
[facility] Emergency Department Provider Note  ____________________________________________  Time seen: Approximately 9:50 PM  I have reviewed the triage vital signs and the nursing notes.   HISTORY  Chief Complaint Vaginal Itching and Dysuria   HPI Yvonne Rios is a 25 y.o. female who presents to the emergency department for evaluation of vaginal itching. She was treated last week for a UTI. She states she has severe vaginal irritation, burning, and itching. She denies vaginal discharge.   Past Medical History:  Diagnosis Date  . Asthma   . Migraine   . MRSA (methicillin resistant staph aureus) culture positive   . Pyloric stenosis     There are no active problems to display for this patient.   History reviewed. No pertinent surgical history.  Prior to Admission medications   Medication Sig Start Date End Date Taking? Authorizing Provider  ADDERALL XR 20 MG 24 hr capsule Take 1 capsule by mouth 2 (two) times daily. 03/05/15   Historical Provider, MD  albuterol (PROVENTIL HFA;VENTOLIN HFA) 108 (90 Base) MCG/ACT inhaler Inhale 2 puffs into the lungs every 4 (four) hours as needed for wheezing or shortness of breath. 06/23/15   Delorise Royals Cuthriell, PA-C  albuterol (PROVENTIL HFA;VENTOLIN HFA) 108 (90 Base) MCG/ACT inhaler Inhale 2 puffs into the lungs every 6 (six) hours as needed for wheezing or shortness of breath. 10/17/15   Hassan Rowan, MD  AMITIZA 8 MCG capsule Take 1 capsule by mouth 2 (two) times daily. 03/14/15   Historical Provider, MD  baclofen (LIORESAL) 10 MG tablet Take 1-2 tablets by mouth at bedtime. 03/06/15   Historical Provider, MD  beclomethasone (QVAR) 80 MCG/ACT inhaler Inhale 2 puffs into the lungs 2 (two) times daily.    Historical Provider, MD  benzonatate (TESSALON PERLES) 100 MG capsule Take 1 capsule (100 mg total) by mouth every 6 (six) hours as needed for cough. 04/27/15 04/26/16  Rebecka Apley, MD  benzonatate  (TESSALON) 100 MG capsule Take 1 capsule (100 mg total) by mouth every 8 (eight) hours. 10/17/15   Hassan Rowan, MD  cetirizine (ZYRTEC) 10 MG tablet Take 1 tablet (10 mg total) by mouth daily. 06/23/15   Delorise Royals Cuthriell, PA-C  chlorpheniramine-HYDROcodone (TUSSIONEX PENNKINETIC ER) 10-8 MG/5ML SUER Take 5 mLs by mouth 2 (two) times daily. 07/01/15   Rebecka Apley, MD  ciprofloxacin (CIPRO) 500 MG tablet Take 1 tablet (500 mg total) by mouth 2 (two) times daily. 02/27/16   Chinita Pester, FNP  clonazePAM (KLONOPIN) 1 MG tablet Take 1 mg by mouth 3 (three) times daily as needed for anxiety.    Historical Provider, MD  diazepam (VALIUM) 10 MG tablet Take 10 mg by mouth daily.    Historical Provider, MD  fexofenadine-pseudoephedrine (ALLEGRA-D ALLERGY & CONGESTION) 180-240 MG 24 hr tablet Take 1 tablet by mouth daily. 10/17/15   Hassan Rowan, MD  fluconazole (DIFLUCAN) 150 MG tablet Take 1 tablet (150 mg total) by mouth daily. 02/27/16   Chinita Pester, FNP  fluticasone (FLONASE) 50 MCG/ACT nasal spray Place 1 spray into both nostrils 2 (two) times daily. 06/23/15   Delorise Royals Cuthriell, PA-C  gabapentin (NEURONTIN) 300 MG capsule Take 1 capsule by mouth 2 (two) times daily. 03/13/15   Historical Provider, MD  ibuprofen (ADVIL,MOTRIN) 800 MG tablet Take 1 tablet (800 mg total) by mouth every 8 (eight) hours as needed. 03/13/15   Ignacia Bayley, PA-C  ketorolac (ACULAR) 0.4 % SOLN Place 1 drop into both eyes  daily. 03/04/15   Historical Provider, MD  lamoTRIgine (LAMICTAL) 200 MG tablet Take 200 mg by mouth 2 (two) times daily.    Historical Provider, MD  levofloxacin (LEVAQUIN) 500 MG tablet Take 1 tablet (500 mg total) by mouth daily. 01/25/16   Jolene ProvostKirtida Patel, MD  loratadine (CLARITIN) 10 MG tablet Take 1 tablet by mouth daily. 03/13/15   Historical Provider, MD  meloxicam (MOBIC) 15 MG tablet Take 1 tablet (15 mg total) by mouth daily. 10/17/15   Hassan RowanEugene Wade, MD  metoprolol (TOPROL-XL) 200 MG 24 hr tablet  Take 200 mg by mouth daily.    Historical Provider, MD  montelukast (SINGULAIR) 10 MG tablet Take 10 mg by mouth at bedtime.    Historical Provider, MD  nabumetone (RELAFEN) 750 MG tablet Take 1 tablet by mouth 2 (two) times daily. 03/06/15   Historical Provider, MD  ondansetron (ZOFRAN ODT) 8 MG disintegrating tablet Take 1 tablet (8 mg total) by mouth 2 (two) times daily. 02/17/16   Lutricia FeilWilliam P Roemer, PA-C  rizatriptan (MAXALT) 10 MG tablet Take 1 tablet by mouth every 2 (two) hours as needed. 03/06/15   Historical Provider, MD  sertraline (ZOLOFT) 100 MG tablet Take 100 mg by mouth daily.    Historical Provider, MD  sulfamethoxazole-trimethoprim (BACTRIM DS,SEPTRA DS) 800-160 MG tablet Take 1 tablet by mouth 2 (two) times daily. 02/08/16   Payton Mccallumrlando Conty, MD  topiramate (TOPAMAX) 100 MG tablet Take 100 mg by mouth at bedtime. 03/06/15   Historical Provider, MD    Allergies Cefprozil; Amoxil [amoxicillin]; and Augmentin [amoxicillin-pot clavulanate]  History reviewed. No pertinent family history.  Social History Social History  Substance Use Topics  . Smoking status: Never Smoker  . Smokeless tobacco: Never Used  . Alcohol use No     Comment: rarely    Review of Systems Constitutional: Negative for fever. Respiratory: Negative for shortness of breath or cough. Gastrointestinal: Negative for abdominal pain; Negative for nausea , Negative for vomiting. Genitourinary: Positive for dysuria , negative for vaginal discharge. Musculoskeletal: Negative for back pain. Skin: Negative for rash, lesion, or wound. ____________________________________________   PHYSICAL EXAM:  VITAL SIGNS: ED Triage Vitals  Enc Vitals Group     BP 02/27/16 2143 139/79     Pulse Rate 02/27/16 2143 64     Resp 02/27/16 2143 18     Temp 02/27/16 2143 98.3 F (36.8 C)     Temp Source 02/27/16 2143 Oral     SpO2 02/27/16 2143 97 %     Weight 02/27/16 2144 (!) 330 lb (149.7 kg)     Height 02/27/16 2144 5\' 8"   (1.727 m)     Head Circumference --      Peak Flow --      Pain Score 02/27/16 2144 7     Pain Loc --      Pain Edu? --      Excl. in GC? --     Constitutional: Alert and oriented. Well appearing and in no acute distress. Eyes: Conjunctivae are normal. PERRL. EOMI. Head: Atraumatic. Nose: No congestion/rhinnorhea. Mouth/Throat: Mucous membranes are moist. Respiratory: Normal respiratory effort.  No retractions. Gastrointestinal: Abdomen soft and nontender Genitourinary: Pelvic exam: Difficult due to body habitus--cervix visualized, but unable to see the os, no CMT on limited exam, white discharge in vaginal vault and on vaginal walls. Labia do not appear erythematous. No lesions noted. Musculoskeletal: No extremity tenderness nor edema.  Neurologic:  Normal speech and language. No gross focal neurologic deficits  are appreciated. Speech is normal. No gait instability. Skin:  Skin is warm, dry and intact. No rash noted. Psychiatric: Mood and affect are normal. Speech and behavior are normal.  ____________________________________________   LABS (all labs ordered are listed, but only abnormal results are displayed)  Labs Reviewed  WET PREP, GENITAL - Abnormal; Notable for the following:       Result Value   WBC, Wet Prep HPF POC MODERATE (*)    All other components within normal limits  URINALYSIS COMPLETEWITH MICROSCOPIC ([facility] ONLY) - Abnormal; Notable for the following:    Color, Urine YELLOW (*)    APPearance CLEAR (*)    Hgb urine dipstick 1+ (*)    Protein, ur 30 (*)    Leukocytes, UA 3+ (*)    Bacteria, UA RARE (*)    Squamous Epithelial / LPF 0-5 (*)    All other components within normal limits  CHLAMYDIA/NGC RT PCR ([facility] ONLY)   ____________________________________________  RADIOLOGY  Not indicated. ____________________________________________   PROCEDURES  Procedure(s) performed: See pelvic exam  above  ____________________________________________   INITIAL IMPRESSION / ASSESSMENT AND PLAN / ED COURSE  Clinical Course     Pertinent labs & imaging results that were available during my care of the patient were reviewed by me and considered in my medical decision making (see chart for details).   Patient will be given prescriptions for Cipro, Diflucan, and miconazole cream today. She was advised to follow up with gyn for symptoms that are not improving over the week. She was advised to return to the ER for symptoms that change or worsen if unable to schedule an appointment. ____________________________________________   FINAL CLINICAL IMPRESSION(S) / ED DIAGNOSES  Final diagnoses:  UTI (lower urinary tract infection)  Vaginal candida    Note:  This document was prepared using Dragon voice recognition software and may include unintentional dictation errors.    Chinita Pester, FNP 02/28/16 2247    Myrna Blazer, MD 03/01/16 [phone]

## 2016-04-28 ENCOUNTER — Encounter: Payer: Self-pay | Admitting: *Deleted

## 2016-04-28 ENCOUNTER — Ambulatory Visit
Admission: EM | Admit: 2016-04-28 | Discharge: 2016-04-28 | Disposition: A | Discharge status: Home / Self Care | Payer: Medicaid Other | Attending: Emergency Medicine | Admitting: Emergency Medicine

## 2016-04-28 DIAGNOSIS — R0981 Nasal congestion: Secondary | ICD-10-CM | POA: Insufficient documentation

## 2016-04-28 DIAGNOSIS — J0141 Acute recurrent pansinusitis: Secondary | ICD-10-CM | POA: Diagnosis not present

## 2016-04-28 DIAGNOSIS — Z0001 Encounter for general adult medical examination with abnormal findings: Secondary | ICD-10-CM | POA: Insufficient documentation

## 2016-04-28 DIAGNOSIS — H9203 Otalgia, bilateral: Secondary | ICD-10-CM | POA: Diagnosis not present

## 2016-04-28 DIAGNOSIS — J029 Acute pharyngitis, unspecified: Secondary | ICD-10-CM | POA: Insufficient documentation

## 2016-04-28 LAB — PREGNANCY, URINE: PREG TEST UR: NEGATIVE

## 2016-04-28 LAB — RAPID STREP SCREEN (MED CTR MEBANE ONLY): Streptococcus, Group A Screen (Direct): NEGATIVE

## 2016-04-28 MED ORDER — IBUPROFEN 800 MG PO TABS: 800.0000 mg | ORAL_TABLET | Freq: Three times a day (TID) | ORAL | 0 refills | Status: DC | PRN

## 2016-04-28 MED ORDER — DOXYCYCLINE HYCLATE 100 MG PO CAPS: 100.0000 mg | ORAL_CAPSULE | Freq: Two times a day (BID) | ORAL | 0 refills | Status: DC

## 2016-04-28 NOTE — Discharge Instructions (Signed)
Take the medication as written. . You may take 800 mg of motrin with 1 gram of tylenol up to 3 times a day as needed for pain. This is an effective combination for pain. . Use a neti pot or the NeilMed sinus rinse as often as you want to to reduce nasal congestion. Follow the directions on the box.   your rapid strep was negative today, so we have sent off a throat culture.  We will contact you and call in the appropriate antibiotics if your culture comes back positive for an infection requiring antibiotic treatment.  Give us a working phone number.   Make sure you drink plenty of extra fluids.  Some people find salt water gargles and  Traditional Medicinal's "Throat Coat" tea helpful. Take 5 mL of liquid Benadryl and 5 mL of Maalox. Mix it together, and then hold it in your mouth for as long as you can and then swallow. You may do this 4 times a day.    Go to www.goodrx.com to look up your medications. This will give you a list of where you can find your prescriptions at the most affordable prices.

## 2016-04-28 NOTE — ED Triage Notes (Signed)
Patient started having symptoms of nasal drainage, sore throat, and cough 2 weeks ago. Additional symptom of bilateral ear pain is now present.

## 2016-04-28 NOTE — ED Provider Notes (Signed)
HPI  SUBJECTIVE:  Yvonne Rios is a 25 y.o. female who presents with nasal congestion, maxillary sinus pain and pressure, upper dental pain, postnasal drip  for the past 2 weeks. She is currently using Flonase and Singulair for her chronic allergy/sinus issues. Zyrtec was discontinued by ENT and 11/8. She reports bilateral ear pain and otorrhea for the past week. No change in hearing. No recent swimming,. Ear pain isn't associated with chewing or opening her mouth. She reports sore throat for the past 2 days, raspy voice. No muffled hot potato voice. No drooling, trismus, sensation throat swelling shut. No rash, abdominal pain, headache, body ache, fatigue. She has not had any fevers. She reports sick contacts with strep and hand-foot-and-mouth disease. No antipyretic in the past 4-6 hours. She completed a course of clindamycin on 10/20 for recurrent sinus infection and OM. She has tried Chief Financial Officerudafed and Chloraseptic without improvement in her symptoms. There are no aggravating symptoms. She has a past medical history of allergies, frequent sinus infections, otitis media, asthma, strep throat, status post tonsillectomy. No history of mono. She reports rash with penicillin as a child. LMP: 10/25 she is not sure if she could be pregnant. [facility]     Past Medical History:  Diagnosis Date  . Asthma   . Migraine   . MRSA (methicillin resistant staph aureus) culture positive   . Pyloric stenosis     History reviewed. No pertinent surgical history.  History reviewed. No pertinent family history.  Social History  Substance Use Topics  . Smoking status: Never Smoker  . Smokeless tobacco: Never Used  . Alcohol use No     Comment: rarely    No current facility-administered medications for this encounter.   Current Outpatient Prescriptions:  .  ADDERALL XR 20 MG 24 hr capsule, Take 1 capsule by mouth 2 (two) times daily., Disp: , Rfl: 0 .  albuterol  (PROVENTIL HFA;VENTOLIN HFA) 108 (90 Base) MCG/ACT inhaler, Inhale 2 puffs into the lungs every 6 (six) hours as needed for wheezing or shortness of breath., Disp: 1 Inhaler, Rfl: 0 .  AMITIZA 8 MCG capsule, Take 1 capsule by mouth 2 (two) times daily., Disp: , Rfl: 0 .  baclofen (LIORESAL) 10 MG tablet, Take 1-2 tablets by mouth at bedtime., Disp: , Rfl: 0 .  clonazePAM (KLONOPIN) 1 MG tablet, Take 1 mg by mouth 3 (three) times daily as needed for anxiety., Disp: , Rfl:  .  diazepam (VALIUM) 10 MG tablet, Take 10 mg by mouth daily., Disp: , Rfl:  .  fluticasone (FLONASE) 50 MCG/ACT nasal spray, Place 1 spray into both nostrils 2 (two) times daily., Disp: 16 g, Rfl: 0 .  Fluticasone-Salmeterol (ADVAIR) 250-50 MCG/DOSE AEPB, Inhale 1 puff into the lungs daily., Disp: , Rfl:  .  gabapentin (NEURONTIN) 300 MG capsule, Take 1 capsule by mouth 2 (two) times daily., Disp: , Rfl: 0 .  lamoTRIgine (LAMICTAL) 200 MG tablet, Take 200 mg by mouth 2 (two) times daily., Disp: , Rfl:  .  montelukast (SINGULAIR) 10 MG tablet, Take 10 mg by mouth at bedtime., Disp: , Rfl:  .  nabumetone (RELAFEN) 750 MG tablet, Take 1 tablet by mouth 2 (two) times daily., Disp: , Rfl: 0 .  ondansetron (ZOFRAN ODT) 8 MG disintegrating tablet, Take 1 tablet (8 mg total) by mouth 2 (two) times daily., Disp: 6 tablet, Rfl: 0 .  rizatriptan (MAXALT) 10 MG tablet, Take 1 tablet by mouth every  2 (two) hours as needed., Disp: , Rfl: 0 .  sertraline (ZOLOFT) 100 MG tablet, Take 100 mg by mouth daily., Disp: , Rfl:  .  topiramate (TOPAMAX) 100 MG tablet, Take 100 mg by mouth at bedtime., Disp: , Rfl: 0 .  beclomethasone (QVAR) 80 MCG/ACT inhaler, Inhale 2 puffs into the lungs 2 (two) times daily., Disp: , Rfl:  .  doxycycline (VIBRAMYCIN) 100 MG capsule, Take 1 capsule (100 mg total) by mouth 2 (two) times daily. X 7 days, Disp: 14 capsule, Rfl: 0 .  ibuprofen (ADVIL,MOTRIN) 800 MG tablet, Take 1 tablet (800 mg total) by mouth every 8  (eight) hours as needed., Disp: 30 tablet, Rfl: 0 .  metoprolol (TOPROL-XL) 200 MG 24 hr tablet, Take 200 mg by mouth daily., Disp: , Rfl:   Allergies  Allergen Reactions  . Cefprozil Other (See Comments)  . Amoxil [Amoxicillin] Rash  . Augmentin [Amoxicillin-Pot Clavulanate] Rash     ROS  As noted in HPI.   Physical Exam  BP 125/66 (BP Location: Left Arm)   Pulse 65   Temp 98.6 F (37 C) (Oral)   Resp 16   Ht 5\' 8"  (1.727 m)   Wt (!) 330 lb (149.7 kg)   LMP 03/31/2016   SpO2 99%   BMI 50.18 kg/m   Constitutional: Well developed, well nourished, no acute distress Eyes:  EOMI, conjunctiva normal bilaterally HENT: Normocephalic, atraumatic,mucus membranes moist R erythematous TM, but not dull, bulging. Left TM within normal limits. Positive boggy, pale, swollen turbinates with clear rhinorrhea. Positive maxillary and frontal sinus tenderness. Tonsils surgically absent. Positive erythematous oropharynx, uvula midline. Positive postnasal drip. Respiratory: Normal inspiratory effort. Neck: No cervical lymphadenopathy Cardiovascular: Normal rate regular rhythm no murmurs rubs or gallops GI: nondistended no appreciable splenomegaly skin: No rash, skin intact Musculoskeletal: no deformities Neurologic: Alert & oriented x 3, no focal neuro deficits Psychiatric: Speech and behavior appropriate   ED Course   Medications - No data to display  Orders Placed This Encounter  Procedures  . Rapid strep screen    Standing Status:   Standing    Number of Occurrences:   1    Order Specific Question:   Patient immune status    Answer:   Immunocompromised  . Culture, group A strep    Standing Status:   Standing    Number of Occurrences:   1  . Pregnancy, urine    Standing Status:   Standing    Number of Occurrences:   1    Results for orders placed or performed during the hospital encounter of 04/28/16 (from the past 24 hour(s))  Rapid strep screen     Status: None    Collection Time: 04/28/16 10:21 AM  Result Value Ref Range   Streptococcus, Group A Screen (Direct) NEGATIVE NEGATIVE  Pregnancy, urine     Status: None   Collection Time: 04/28/16 10:42 AM  Result Value Ref Range   Preg Test, Ur NEGATIVE NEGATIVE   No results found.  ED Clinical Impression  Acute recurrent pansinusitis  Sore throat  ED Assessment/Plan  Checking urine pregnancy per patient request. Strep is negative. We'll send this off for culture. We'll treat the sinusitis given that she has had it for 2 weeks. She was recentlyTreated with clindamycin, so will try doxycycline 100 mg twice a day for 7 days. Advised to continue Flonase, saline nasal irrigation, she is to call ENT and find out if she can go Rios on Allegra/decongestant antihistamine.  If not, she is to continue pseudoephedrine. We'll prescribed ibuprofen 800 mg and Advise Benadryl/Maalox mouthwash for her sore throat.  Discussed labs,  MDM, plan and followup with patient.Patient agrees with plan.   Meds ordered this encounter  Medications  . Fluticasone-Salmeterol (ADVAIR) 250-50 MCG/DOSE AEPB    Sig: Inhale 1 puff into the lungs daily.  Marland Kitchen ibuprofen (ADVIL,MOTRIN) 800 MG tablet    Sig: Take 1 tablet (800 mg total) by mouth every 8 (eight) hours as needed.    Dispense:  30 tablet    Refill:  0  . doxycycline (VIBRAMYCIN) 100 MG capsule    Sig: Take 1 capsule (100 mg total) by mouth 2 (two) times daily. X 7 days    Dispense:  14 capsule    Refill:  0    *This clinic note was created using Scientist, clinical (histocompatibility and immunogenetics). Therefore, there may be occasional mistakes despite careful proofreading.  ?   Domenick Gong, MD 04/28/16 1128

## 2016-05-01 LAB — CULTURE, GROUP A STREP (THRC)

## 2016-05-02 ENCOUNTER — Telehealth: Payer: Self-pay | Admitting: *Deleted

## 2016-05-02 NOTE — Telephone Encounter (Signed)
Called patient, no answer, left message on answering machine reporting a negative strep culture. Advised patient to follow up with PCP or [facility] if symptoms persist.

## 2016-08-31 ENCOUNTER — Ambulatory Visit
Admission: EM | Admit: 2016-08-31 | Discharge: 2016-08-31 | Disposition: A | Discharge status: Home / Self Care | Payer: Medicaid Other | Attending: Family Medicine | Admitting: Family Medicine

## 2016-08-31 DIAGNOSIS — B373 Candidiasis of vulva and vagina: Secondary | ICD-10-CM | POA: Insufficient documentation

## 2016-08-31 DIAGNOSIS — H9203 Otalgia, bilateral: Secondary | ICD-10-CM | POA: Insufficient documentation

## 2016-08-31 DIAGNOSIS — Z880 Allergy status to penicillin: Secondary | ICD-10-CM | POA: Insufficient documentation

## 2016-08-31 DIAGNOSIS — J029 Acute pharyngitis, unspecified: Secondary | ICD-10-CM | POA: Insufficient documentation

## 2016-08-31 DIAGNOSIS — J028 Acute pharyngitis due to other specified organisms: Secondary | ICD-10-CM | POA: Diagnosis not present

## 2016-08-31 DIAGNOSIS — Z79899 Other long term (current) drug therapy: Secondary | ICD-10-CM | POA: Diagnosis not present

## 2016-08-31 DIAGNOSIS — B3731 Acute candidiasis of vulva and vagina: Secondary | ICD-10-CM

## 2016-08-31 LAB — RAPID STREP SCREEN (MED CTR MEBANE ONLY): Streptococcus, Group A Screen (Direct): NEGATIVE

## 2016-08-31 MED ORDER — BENZONATATE 200 MG PO CAPS: 200.0000 mg | ORAL_CAPSULE | Freq: Three times a day (TID) | ORAL | 0 refills | Status: DC | PRN

## 2016-08-31 MED ORDER — FLUCONAZOLE 150 MG PO TABS: 150.0000 mg | ORAL_TABLET | Freq: Once | ORAL | 0 refills | Status: AC

## 2016-08-31 MED ORDER — KETOCONAZOLE 2 % EX CREA: 1.0000 "application " | TOPICAL_CREAM | Freq: Two times a day (BID) | CUTANEOUS | 1 refills | Status: DC

## 2016-08-31 MED ORDER — FEXOFENADINE-PSEUDOEPHED ER 180-240 MG PO TB24: 1.0000 | ORAL_TABLET | Freq: Every day | ORAL | 0 refills | Status: DC

## 2016-08-31 MED ORDER — AZITHROMYCIN 250 MG PO TABS: ORAL_TABLET | ORAL | 0 refills | Status: DC

## 2016-08-31 NOTE — ED Triage Notes (Signed)
Patient states that she is having a sore throat, ear pain, headaches x 1 week. Patient states that she is having yeast like symptoms. Patient states that this always occur after her period and using menstrual pads.

## 2016-08-31 NOTE — ED Provider Notes (Signed)
[facility]    CSN: [id number] Arrival date & time: 08/31/16  0902     History   Chief Complaint Chief Complaint  Patient presents with  . Sore Throat  . Vaginitis    HPI Yvonne Rios is a 26 y.o. female.   She reports over week of pain in her ears nasal congestion sore throat. She states her husband had strep about 2 weeks ago or 3 weeks ago doubtful this is so but she believes it. Along with the sore throat for week she reports irritation of her vaginal area. She states that she always gets E's infection lately after. She uses a pad states things cause irritation and inflammation. Her PCP normally puts her on Diflucan and it takes care of the problems and everything clears up until her next cycle. She of course was Diflucan. She is allergic to penicillin and amoxicillin and Augmentin. She states that she had pyloric stenosis was used 2 weeks so had to have surgery for repair. She's had her tonsils removed as well. She does not smoke. No pertinent family medical history of than mentioned above. Patient does not smoke.   The history is provided by the patient. No language interpreter was used.  Sore Throat  This is a new problem. The current episode started more than 1 week ago. The problem occurs constantly. The problem has not changed since onset.Pertinent negatives include no chest pain, no abdominal pain, no headaches and no shortness of breath. Nothing aggravates the symptoms. Nothing relieves the symptoms. She has tried nothing for the symptoms. The treatment provided mild relief.    Past Medical History:  Diagnosis Date  . Asthma   . Migraine   . MRSA (methicillin resistant staph aureus) culture positive   . Pyloric stenosis     There are no active problems to display for this patient.   History reviewed. No pertinent surgical history.  OB History    No data available       Home Medications    Prior to Admission medications   Medication Sig  Start Date End Date Taking? Authorizing Provider  ADDERALL XR 20 MG 24 hr capsule Take 1 capsule by mouth 2 (two) times daily. 03/05/15  Yes Historical Provider, MD  albuterol (PROVENTIL HFA;VENTOLIN HFA) 108 (90 Base) MCG/ACT inhaler Inhale 2 puffs into the lungs every 6 (six) hours as needed for wheezing or shortness of breath. 10/17/15  Yes Hassan Rowan, MD  AMITIZA 8 MCG capsule Take 1 capsule by mouth 2 (two) times daily. 03/14/15  Yes Historical Provider, MD  baclofen (LIORESAL) 10 MG tablet Take 1-2 tablets by mouth at bedtime. 03/06/15  Yes Historical Provider, MD  beclomethasone (QVAR) 80 MCG/ACT inhaler Inhale 2 puffs into the lungs 2 (two) times daily.   Yes Historical Provider, MD  clonazePAM (KLONOPIN) 1 MG tablet Take 1 mg by mouth 3 (three) times daily as needed for anxiety.   Yes Historical Provider, MD  diazepam (VALIUM) 10 MG tablet Take 10 mg by mouth daily.   Yes Historical Provider, MD  fluticasone (FLONASE) 50 MCG/ACT nasal spray Place 1 spray into both nostrils 2 (two) times daily. 06/23/15  Yes Christiane Ha D Cuthriell, PA-C  Fluticasone-Salmeterol (ADVAIR) 250-50 MCG/DOSE AEPB Inhale 1 puff into the lungs daily.   Yes Historical Provider, MD  gabapentin (NEURONTIN) 300 MG capsule Take 1 capsule by mouth 2 (two) times daily. 03/13/15  Yes Historical Provider, MD  ibuprofen (ADVIL,MOTRIN) 800 MG tablet Take 1 tablet (800  mg total) by mouth every 8 (eight) hours as needed. 04/28/16  Yes Domenick GongAshley Mortenson, MD  lamoTRIgine (LAMICTAL) 200 MG tablet Take 200 mg by mouth 2 (two) times daily.   Yes Historical Provider, MD  montelukast (SINGULAIR) 10 MG tablet Take 10 mg by mouth at bedtime.   Yes Historical Provider, MD  rizatriptan (MAXALT) 10 MG tablet Take 1 tablet by mouth every 2 (two) hours as needed. 03/06/15  Yes Historical Provider, MD  sertraline (ZOLOFT) 100 MG tablet Take 100 mg by mouth daily.   Yes Historical Provider, MD  topiramate (TOPAMAX) 100 MG tablet Take 100 mg by mouth at  bedtime. 03/06/15  Yes Historical Provider, MD  azithromycin (ZITHROMAX Z-PAK) 250 MG tablet Take 2 tablets first day and then 1 po a day for 4 days 08/31/16   Hassan RowanEugene Jazmon Kos, MD  benzonatate (TESSALON) 200 MG capsule Take 1 capsule (200 mg total) by mouth 3 (three) times daily as needed. 08/31/16   Hassan RowanEugene Wendelin Bradt, MD  doxycycline (VIBRAMYCIN) 100 MG capsule Take 1 capsule (100 mg total) by mouth 2 (two) times daily. X 7 days 04/28/16   Domenick GongAshley Mortenson, MD  fexofenadine-pseudoephedrine (ALLEGRA-D ALLERGY & CONGESTION) 180-240 MG 24 hr tablet Take 1 tablet by mouth daily. 08/31/16   Hassan RowanEugene Gwynn Crossley, MD  fluconazole (DIFLUCAN) 150 MG tablet Take 1 tablet (150 mg total) by mouth once. 08/31/16 08/31/16  Hassan RowanEugene Shanquita Ronning, MD  ketoconazole (NIZORAL) 2 % cream Apply 1 application topically 2 (two) times daily. To the perineum area for two weeks 08/31/16   Hassan RowanEugene Mahi Zabriskie, MD  metoprolol (TOPROL-XL) 200 MG 24 hr tablet Take 200 mg by mouth daily.    Historical Provider, MD  nabumetone (RELAFEN) 750 MG tablet Take 1 tablet by mouth 2 (two) times daily. 03/06/15   Historical Provider, MD  ondansetron (ZOFRAN ODT) 8 MG disintegrating tablet Take 1 tablet (8 mg total) by mouth 2 (two) times daily. 02/17/16   Lutricia FeilWilliam P Roemer, PA-C    Family History History reviewed. No pertinent family history.  Social History Social History  Substance Use Topics  . Smoking status: Never Smoker  . Smokeless tobacco: Never Used  . Alcohol use No     Comment: rarely     Allergies   Cefprozil; Amoxil [amoxicillin]; and Augmentin [amoxicillin-pot clavulanate]   Review of Systems Review of Systems  HENT: Positive for ear pain and sore throat.   Respiratory: Negative for shortness of breath.   Cardiovascular: Negative for chest pain.  Gastrointestinal: Negative for abdominal pain.  Genitourinary: Positive for vaginal pain.  Neurological: Negative for headaches.  All other systems reviewed and are negative.    Physical Exam Triage  Vital Signs ED Triage Vitals  Enc Vitals Group     BP 08/31/16 0939 121/77     Pulse Rate 08/31/16 0939 80     Resp 08/31/16 0939 18     Temp 08/31/16 0939 98.6 F (37 C)     Temp Source 08/31/16 0939 Oral     SpO2 08/31/16 0939 98 %     Weight 08/31/16 0936 (!) 320 lb (145.2 kg)     Height 08/31/16 0936 5\' 8"  (1.727 m)     Head Circumference --      Peak Flow --      Pain Score 08/31/16 0937 8     Pain Loc --      Pain Edu? --      Excl. in GC? --    No data found.  Updated Vital Signs BP 121/77 (BP Location: Left Arm)   Pulse 80   Temp 98.6 F (37 C) (Oral)   Resp 18   Ht 5\' 8"  (1.727 m)   Wt (!) 320 lb (145.2 kg)   LMP 08/26/2016   SpO2 98%   BMI 48.66 kg/m   Visual Acuity Right Eye Distance:   Left Eye Distance:   Bilateral Distance:    Right Eye Near:   Left Eye Near:    Bilateral Near:     Physical Exam  Constitutional: She appears well-developed and well-nourished.  HENT:  Head: Normocephalic and atraumatic.  Right Ear: External ear normal.  Left Ear: External ear normal.  Nose: Nose normal.  Mouth/Throat: Oropharynx is clear and moist.  Eyes: Conjunctivae and EOM are normal. Pupils are equal, round, and reactive to light.  Neck: Normal range of motion. Neck supple.  Cardiovascular: Normal rate and regular rhythm.   Pulmonary/Chest: Effort normal.  Musculoskeletal: Normal range of motion.  Neurological: She is alert.  Skin: Skin is warm.  Psychiatric: She has a normal mood and affect.  Vitals reviewed.    UC Treatments / Results  Labs (all labs ordered are listed, but only abnormal results are displayed) Labs Reviewed  RAPID STREP SCREEN (NOT AT [facility])  CULTURE, GROUP A STREP [facility])    EKG  EKG Interpretation None       Radiology No results found.  Procedures Procedures (including critical care time)  Medications Ordered in UC Medications - No data to display   Initial Impression / Assessment and Plan / UC Course  I have  reviewed the triage vital signs and the nursing notes.  Pertinent labs & imaging results that were available during my care of the patient were reviewed by me and considered in my medical decision making (see chart for details).     patient will be placed on Diflucan 2 pills per but she states is usual suspension of pills from her PCP recommended she see his PCP NAD to have a repeat pelvic exam since the last one she thinks is about 2-3 years ago. We'll place her on his symphysis sore throat going on for week strep test is negative will place her on Zithromax Z-Pak and Tessalon Perles for cough and Allegra-D for the congestion and Nizoral cream for outside of the vulvar area for this irritation. Work note given for today and follow-up with PCP as needed.    Final Clinical Impressions(s) / UC Diagnoses   Final diagnoses:  Pharyngitis due to other organism  Yeast vaginitis  Otalgia of both ears    New Prescriptions Discharge Medication List as of 08/31/2016 10:30 AM    START taking these medications   Details  azithromycin (ZITHROMAX Z-PAK) 250 MG tablet Take 2 tablets first day and then 1 po a day for 4 days, Normal    benzonatate (TESSALON) 200 MG capsule Take 1 capsule (200 mg total) by mouth 3 (three) times daily as needed., Starting Tue 08/31/2016, Normal    fexofenadine-pseudoephedrine (ALLEGRA-D ALLERGY & CONGESTION) 180-240 MG 24 hr tablet Take 1 tablet by mouth daily., Starting Tue 08/31/2016, Normal    fluconazole (DIFLUCAN) 150 MG tablet Take 1 tablet (150 mg total) by mouth once., Starting Tue 08/31/2016, Normal    ketoconazole (NIZORAL) 2 % cream Apply 1 application topically 2 (two) times daily. To the perineum area for two weeks, Starting Tue 08/31/2016, Normal        Note: This dictation was prepared with  Dragon dictation along with smaller Lobbyist. Any transcriptional errors that result from this process are unintentional.   Hassan Rowan, MD 08/31/16 1042

## 2016-09-03 LAB — CULTURE, GROUP A STREP (THRC)

## 2016-09-20 ENCOUNTER — Encounter: Payer: Self-pay | Admitting: Obstetrics and Gynecology

## 2016-09-20 ENCOUNTER — Ambulatory Visit (INDEPENDENT_AMBULATORY_CARE_PROVIDER_SITE_OTHER): Payer: Medicaid Other | Admitting: Obstetrics and Gynecology

## 2016-09-20 VITALS — BP 124/68 | HR 66 | Ht 68.0 in | Wt 325.0 lb

## 2016-09-20 DIAGNOSIS — Z3169 Encounter for other general counseling and advice on procreation: Secondary | ICD-10-CM | POA: Diagnosis not present

## 2016-09-20 DIAGNOSIS — Z6841 Body Mass Index (BMI) 40.0 and over, adult: Secondary | ICD-10-CM

## 2016-09-20 NOTE — Progress Notes (Signed)
Gynecology H&P  PCP: [facility]  Chief Complaint:  Chief Complaint  Patient presents with  . discuss fertility    History of Present Illness: Patient is a 26 y.o. [id number] presenting for evaluation of infertility. Patient and partner have been attempting conception for 1 years. Marital Status: married. 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 Average Interval: 30  days Duration of flow: 5 days Heavy Menses: yes Clots: yes [place] yes Ovulation Predictor Kits Positive  unknown Intermenstrual Bleeding: no Dysmenorrhea: no Amenorrhea: not applicable Wt Change: no Hirsutism: no Balding: no Acne: yes Galactorrhea: no Hot Flashes: no  Tubal Factor Previous abdominal or pelvic surgery: no Pelvic Pain:  no Endometriosis: no STD: no PID: no Laparoscopy: no Prior HSG: no  Female Factor Sired prior conception:  yes Semen analysis: no  Contraception IUD, depo provera, and OCPs in past    Review of Systems: ROS  Past Medical History:  Past Medical History:  Diagnosis Date  . Asthma   . Migraine   . MRSA (methicillin resistant staph aureus) culture positive   . Pyloric stenosis     Past Surgical History:  History reviewed. No pertinent surgical history.   Family History:  Family History  Problem Relation Age of Onset  . 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 Marital  status: Married    Spouse name: N/A  . Number of children: N/A  . Years of education: N/A   Occupational History  . Not on file.   Social History Main Topics  . Smoking status: Never Smoker  . Smokeless tobacco: Never Used  . Alcohol use No     Comment: rarely  . Drug use: No  . Sexual activity: Yes   Other Topics Concern  . Not on file   Social History Narrative  . No narrative on file    Allergies:  Allergies  Allergen Reactions  . Cefprozil Other (See Comments)  . Amoxil [Amoxicillin] Rash  . Augmentin [Amoxicillin-Pot Clavulanate] Rash    Medications: Prior to Admission medications   Medication Sig Start Date End Date Taking? Authorizing Provider  ADDERALL XR 20 MG 24 hr capsule Take 1 capsule by mouth 2 (two) times daily. 03/05/15  Yes Historical Provider, MD  albuterol (PROVENTIL HFA;VENTOLIN HFA) 108 (90 Base) MCG/ACT inhaler Inhale 2 puffs into the lungs every 6 (six) hours as needed for wheezing or shortness of breath. 10/17/15  Yes Hassan Rowan, MD  AMITIZA 8 MCG capsule Take 1 capsule by mouth 2 (two) times daily. 03/14/15  Yes Historical Provider, MD  baclofen (LIORESAL) 10 MG tablet Take 1-2 tablets by mouth at bedtime. 03/06/15  Yes Historical Provider, MD  beclomethasone (QVAR) 80 MCG/ACT inhaler Inhale 2 puffs into the lungs 2 (two) times daily.   Yes Historical Provider, MD  clonazePAM (KLONOPIN) 1 MG tablet Take 1 mg by mouth 3 (three) times daily as needed for anxiety.   Yes Historical Provider, MD  diazepam (VALIUM) 10 MG  tablet Take 10 mg by mouth daily.   Yes Historical Provider, MD  fluticasone (FLONASE) 50 MCG/ACT nasal spray Place 1 spray into both nostrils 2 (two) times daily. 06/23/15  Yes Christiane Ha D Cuthriell, PA-C  Fluticasone-Salmeterol (ADVAIR) 250-50 MCG/DOSE AEPB Inhale 1 puff into the lungs daily.   Yes Historical Provider, MD  gabapentin (NEURONTIN) 300 MG capsule Take 1 capsule by mouth 2 (two) times daily. 03/13/15  Yes Historical Provider, MD    lamoTRIgine (LAMICTAL) 200 MG tablet Take 200 mg by mouth 2 (two) times daily.   Yes Historical Provider, MD  montelukast (SINGULAIR) 10 MG tablet Take 10 mg by mouth at bedtime.   Yes Historical Provider, MD  nabumetone (RELAFEN) 750 MG tablet Take 1 tablet by mouth 2 (two) times daily. 03/06/15  Yes Historical Provider, MD  rizatriptan (MAXALT) 10 MG tablet Take 1 tablet by mouth every 2 (two) hours as needed. 03/06/15  Yes Historical Provider, MD  sertraline (ZOLOFT) 100 MG tablet Take 100 mg by mouth daily.   Yes Historical Provider, MD  topiramate (TOPAMAX) 100 MG tablet Take 100 mg by mouth at bedtime. 03/06/15  Yes Historical Provider, MD   Topomax oral cleft, low birth weight, relafen (NSAID), Lamictal (folic acid metabolism), valium, klonopin. adderal Physical Exam Vitals: Blood pressure 124/68, pulse 66, height  (1.727 m), weight (!) 325 lb (147.4 kg), last menstrual period 08/26/2016. General: NAD HEENT: normocephalic, anicteric Pulmonary: No increased work of breathing Extremities: no edema, erythema, or tenderness Neurologic: Grossly intact Psychiatric: mood appropriate, affect full  @  Assessment: 26 y.o. [id number] No problem-specific Assessment & Plan notes found for this encounter.   Plan: 1) We discussed the underlying etiologies which may be implicated in a couple experiencing difficulty conceiving.  The average couple will conceive within the span of 1 year with unprotected coitus, with a monthly fecundity rate of 20% or 1 in 5.  Even without further work up or intervention the patient and her partner may be successful in conceiving unassisted, although if an underlying etiology can be identified and addressed fecundity rate may improve.  The work up entails examining for ovulatory function, tubal patency, and ruling out female factor infertility.  These may be looked at concurrently or sequentially.  The downside of sequential work up is that this method may miss  issues if more than one compartment is contributing.  She is aware that tubal factor or moderate to severe female factor infertility will require further consultation with a reproductive endocrinologist.  In the case of anovulation, use of Clomid (clomiphen citrate) or Femara (letrazole) were discussed with the understanding the the later is an off-label, but well supported use.  With either of these drugs the risk of multiples increases from the standard population rate of 2% to approximately 10%, with higher order multiples possible but unlikely.  Both drugs may require some time to titrate to the appropriate dosage to ensure consistent ovulation.  Cycles will be limited to 6 cycles on each drug secondary to decreasing rates of conception after 6 cycles.  In addition should patient be started on ovulation induction with Clomid she was advised to discontinue the drug for any vision changes as this is a rare but potentially permanent side-effect if medication is continued.  We discussed timing of intercourse as well as the use of ovulation predictor kits identify the patient's fertile window each month.     2) Preconception counseling  - immunization up to date.  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  Instructed to start prenatal vitamins while trying to conceive.

## 2016-09-20 NOTE — Patient Instructions (Signed)
Polycystic Ovarian Syndrome Polycystic ovarian syndrome (PCOS) is a common hormonal disorder among women of reproductive age. In most women with PCOS, many small fluid-filled sacs (cysts) grow on the ovaries, and the cysts are not part of a normal menstrual cycle. PCOS can cause problems with your menstrual periods and make it difficult to get pregnant. It can also cause an increased risk of miscarriage with pregnancy. If it is not treated, PCOS can lead to serious health problems, such as diabetes and heart disease. What are the causes? The cause of PCOS is not known, but it may be the result of a combination of certain factors, such as:  Irregular menstrual cycle.  High levels of certain hormones (androgens).  Problems with the hormone that helps to control blood sugar (insulin resistance).  Certain genes. What increases the risk? This condition is more likely to develop in women who have a family history of PCOS. What are the signs or symptoms? Symptoms of PCOS may include:  Multiple ovarian cysts.  Infrequent periods or no periods.  Periods that are too frequent or too heavy.  Unpredictable periods.  Inability to get pregnant (infertility) because of not ovulating.  Increased growth of hair on the face, chest, stomach, back, thumbs, thighs, or toes.  Acne or oily skin. Acne may develop during adulthood, and it may not respond to treatment.  Pelvic pain.  Weight gain or obesity.  Patches of thickened and dark brown or black skin on the neck, arms, breasts, or thighs (acanthosis nigricans).  Excess hair growth on the face, chest, abdomen, or upper thighs (hirsutism). How is this diagnosed? This condition is diagnosed based on:  Your medical history.  A physical exam, including a pelvic exam. Your health care provider may look for areas of increased hair growth on your skin.  Tests, such as:  Ultrasound. This may be used to examine the ovaries and the lining of the  uterus (endometrium) for cysts.  Blood tests. These may be used to check levels of sugar (glucose), female hormone (testosterone), and female hormones (estrogen and progesterone) in your blood. How is this treated? There is no cure for PCOS, but treatment can help to manage symptoms and prevent more health problems from developing. Treatment varies depending on:  Your symptoms.  Whether you want to have a baby or whether you need birth control (contraception). Treatment may include nutrition and lifestyle changes along with:  Progesterone hormone to start a menstrual period.  Birth control pills to help you have regular menstrual periods.  Medicines to make you ovulate, if you want to get pregnant.  Medicine to reduce excessive hair growth.  Surgery, in severe cases. This may involve making small holes in one or both of your ovaries. This decreases the amount of testosterone that your body produces. Follow these instructions at home:  Take over-the-counter and prescription medicines only as told by your health care provider.  Follow a healthy meal plan. This can help you reduce the effects of PCOS.  Eat a healthy diet that includes lean proteins, complex carbohydrates, fresh fruits and vegetables, low-fat dairy products, and healthy fats. Make sure to eat enough fiber.  If you are overweight, lose weight as told by your health care provider.  Losing 10% of your body weight may improve symptoms.  Your health care provider can determine how much weight loss is best for you and can help you lose weight safely.  Keep all follow-up visits as told by your health care provider. This  is important. Contact a health care provider if:  Your symptoms do not get better with medicine.  You develop new symptoms. This information is not intended to replace advice given to you by your health care provider. Make sure you discuss any questions you have with your health care provider. Document  Released: 09/17/2004 Document Revised: 01/20/2016 Document Reviewed: 11/09/2015 Elsevier Interactive Patient Education  2017 [employer]  

## 2016-09-21 LAB — PROGESTERONE: Progesterone: 8.6 ng/mL

## 2016-09-23 ENCOUNTER — Encounter: Payer: Self-pay | Admitting: Obstetrics and Gynecology

## 2016-09-23 LAB — TSH+PRL+FSH+TESTT+LH+DHEA S...
17-Hydroxyprogesterone: 181 ng/dL
ANDROSTENEDIONE: 87 ng/dL (ref 41–262)
DHEA SO4: 202.9 ug/dL (ref 84.8–378.0)
FSH: 1.8 m[IU]/mL
LH: 7.9 m[IU]/mL
Prolactin: 10.4 ng/mL (ref 4.8–23.3)
TESTOSTERONE: 19 ng/dL (ref 8–48)
TSH: 1.39 u[IU]/mL (ref 0.450–4.500)
Testosterone, Free: 1.8 pg/mL (ref 0.0–4.2)

## 2016-09-23 LAB — HEMOGLOBIN A1C
Est. average glucose Bld gHb Est-mCnc: 143 mg/dL
HEMOGLOBIN A1C: 6.6 % — AB (ref 4.8–5.6)

## 2016-09-30 ENCOUNTER — Encounter: Payer: Self-pay | Admitting: Obstetrics and Gynecology

## 2016-10-01 ENCOUNTER — Ambulatory Visit (INDEPENDENT_AMBULATORY_CARE_PROVIDER_SITE_OTHER): Payer: Medicaid Other | Admitting: Obstetrics and Gynecology

## 2016-10-01 ENCOUNTER — Ambulatory Visit: Payer: Medicaid Other

## 2016-10-01 ENCOUNTER — Encounter: Payer: Self-pay | Admitting: Obstetrics and Gynecology

## 2016-10-01 VITALS — BP 110/70 | Ht 68.0 in | Wt 327.0 lb

## 2016-10-01 DIAGNOSIS — Z3169 Encounter for other general counseling and advice on procreation: Secondary | ICD-10-CM

## 2016-10-01 NOTE — Progress Notes (Signed)
Gynecology Ultrasound Follow Up  Chief Complaint:  Chief Complaint  Patient presents with  . U/S follow up     History of Present Illness: Patient is a 26 y.o. female who presents today for ultrasound evaluation of possible PCOS.  Patient has a prior history of menorrhagia, menstrual cycles have regulated and appear ovulatory by history.  However, given her weight and inability of conceive we obtained TVUS to evaluate for PCOS.  Lab work was not supportive of PCOS at prior visit.  Ultrasound demonstrates the following findgins Adnexa: no masses seen normal consistency Uterus: Yvonne Rios without myometrial or endometrial abnormalities Additional: N/A  Review of Systems: Review of Systems  Constitutional: Negative for weight loss.  Gastrointestinal: Negative for abdominal pain.  Genitourinary: Negative for dysuria, flank pain, frequency, hematuria and urgency.  Musculoskeletal: Positive for back pain.    Past Medical History:  Past Medical History:  Diagnosis Date  . ADD (attention deficit disorder)   . Anxiety   . Asthma   . Depression   . GERD (gastroesophageal reflux disease)   . IBS (irritable bowel syndrome)   . Migraine   . MRSA (methicillin resistant staph aureus) culture positive   . Oligomenorrhea   . Pyloric stenosis     Past Surgical History:  Past Surgical History:  Procedure Laterality Date  . COLPOSCOPY    . DILATION AND CURETTAGE OF UTERUS  age 24  . HYSTEROSCOPY  2015  . TONSILLECTOMY    . WISDOM TOOTH EXTRACTION      Gynecologic History:  Patient's last menstrual period was 09/27/2016.  Family History:  Family History  Problem Relation Age of Onset  . Ovarian cancer Mother 51    Social History:  Social History   Social History  . Marital status: Married    Spouse name: N/A  . Number of children: N/A  . Years of education: N/A   Occupational History  . Not on file.   Social History Main Topics  . Smoking status: Never Smoker  .  Smokeless tobacco: Never Used  . Alcohol use No     Comment: rarely  . Drug use: No  . Sexual activity: Yes   Other Topics Concern  . Not on file   Social History Narrative  . No narrative on file    Allergies:  Allergies  Allergen Reactions  . Cefprozil Other (See Comments)  . Amoxil [Amoxicillin] Rash  . Augmentin [Amoxicillin-Pot Clavulanate] Rash    Medications: Prior to Admission medications   Medication Sig Start Date End Date Taking? Authorizing Provider  ADDERALL XR 20 MG 24 hr capsule Take 1 capsule by mouth 2 (two) times daily. 03/05/15   Historical Provider, MD  albuterol (PROVENTIL HFA;VENTOLIN HFA) 108 (90 Base) MCG/ACT inhaler Inhale 2 puffs into the lungs every 6 (six) hours as needed for wheezing or shortness of breath. 10/17/15   Hassan Rowan, MD  AMITIZA 8 MCG capsule Take 1 capsule by mouth 2 (two) times daily. 03/14/15   Historical Provider, MD  baclofen (LIORESAL) 10 MG tablet Take 1-2 tablets by mouth at bedtime. 03/06/15   Historical Provider, MD  beclomethasone (QVAR) 80 MCG/ACT inhaler Inhale 2 puffs into the lungs 2 (two) times daily.    Historical Provider, MD  clonazePAM (KLONOPIN) 1 MG tablet Take 1 mg by mouth 3 (three) times daily as needed for anxiety.    Historical Provider, MD  diazepam (VALIUM) 10 MG tablet Take 10 mg by mouth daily.    Historical Provider,  MD  fluticasone (FLONASE) 50 MCG/ACT nasal spray Place 1 spray into both nostrils 2 (two) times daily. 06/23/15   Delorise Royals Cuthriell, PA-C  Fluticasone-Salmeterol (ADVAIR) 250-50 MCG/DOSE AEPB Inhale 1 puff into the lungs daily.    Historical Provider, MD  gabapentin (NEURONTIN) 300 MG capsule Take 1 capsule by mouth 2 (two) times daily. 03/13/15   Historical Provider, MD  lamoTRIgine (LAMICTAL) 200 MG tablet Take 200 mg by mouth 2 (two) times daily.    Historical Provider, MD  montelukast (SINGULAIR) 10 MG tablet Take 10 mg by mouth at bedtime.    Historical Provider, MD  nabumetone (RELAFEN) 750  MG tablet Take 1 tablet by mouth 2 (two) times daily. 03/06/15   Historical Provider, MD  rizatriptan (MAXALT) 10 MG tablet Take 1 tablet by mouth every 2 (two) hours as needed. 03/06/15   Historical Provider, MD  sertraline (ZOLOFT) 100 MG tablet Take 100 mg by mouth daily.    Historical Provider, MD  topiramate (TOPAMAX) 100 MG tablet Take 100 mg by mouth at bedtime. 03/06/15   Historical Provider, MD    Physical Exam Vitals: Blood pressure 110/70, height  (1.727 m), weight (!) 327 lb (148.3 kg), last menstrual period 09/27/2016.  General: NAD HEENT: normocephalic, anicteric Pulmonary: No increased work of breathing Extremities: no edema, erythema, or tenderness Neurologic: Grossly intact, normal gait Psychiatric: mood appropriate, affect full   Assessment: 26 y.o. [id number] with infertility  Plan: Problem List Items Addressed This Visit    None      1) PCOS - no evidence of PCOS on labs or ultrasound today.   Continue to recommend weight loss as being beneficial for conceiving.  Given normal labs and ultrasound will proceed with semen analysis as next step in work up.  2) If semen analysis is normal instructed to keep menstrual diary and start ovulation predictor kits  2) A total of 15 minutes were spent in face-to-face contact with the patient during this encounter with over half of that time devoted to counseling and coordination of care.

## 2016-10-04 ENCOUNTER — Ambulatory Visit: Payer: Medicaid Other

## 2016-10-04 ENCOUNTER — Ambulatory Visit
Admission: EM | Admit: 2016-10-04 | Discharge: 2016-10-04 | Disposition: A | Discharge status: Home / Self Care | Payer: Medicaid Other | Attending: Family Medicine | Admitting: Family Medicine

## 2016-10-04 ENCOUNTER — Encounter: Payer: Self-pay | Admitting: *Deleted

## 2016-10-04 DIAGNOSIS — R062 Wheezing: Secondary | ICD-10-CM

## 2016-10-04 DIAGNOSIS — F419 Anxiety disorder, unspecified: Secondary | ICD-10-CM | POA: Insufficient documentation

## 2016-10-04 DIAGNOSIS — K589 Irritable bowel syndrome without diarrhea: Secondary | ICD-10-CM | POA: Insufficient documentation

## 2016-10-04 DIAGNOSIS — K219 Gastro-esophageal reflux disease without esophagitis: Secondary | ICD-10-CM | POA: Insufficient documentation

## 2016-10-04 DIAGNOSIS — F329 Major depressive disorder, single episode, unspecified: Secondary | ICD-10-CM | POA: Diagnosis not present

## 2016-10-04 DIAGNOSIS — G43909 Migraine, unspecified, not intractable, without status migrainosus: Secondary | ICD-10-CM | POA: Insufficient documentation

## 2016-10-04 DIAGNOSIS — J0100 Acute maxillary sinusitis, unspecified: Secondary | ICD-10-CM

## 2016-10-04 DIAGNOSIS — J45901 Unspecified asthma with (acute) exacerbation: Secondary | ICD-10-CM | POA: Diagnosis not present

## 2016-10-04 DIAGNOSIS — R05 Cough: Secondary | ICD-10-CM

## 2016-10-04 DIAGNOSIS — R0602 Shortness of breath: Secondary | ICD-10-CM | POA: Diagnosis present

## 2016-10-04 DIAGNOSIS — J40 Bronchitis, not specified as acute or chronic: Secondary | ICD-10-CM | POA: Diagnosis not present

## 2016-10-04 DIAGNOSIS — F988 Other specified behavioral and emotional disorders with onset usually occurring in childhood and adolescence: Secondary | ICD-10-CM | POA: Diagnosis not present

## 2016-10-04 DIAGNOSIS — Z8614 Personal history of Methicillin resistant Staphylococcus aureus infection: Secondary | ICD-10-CM | POA: Insufficient documentation

## 2016-10-04 MED ORDER — BENZONATATE 100 MG PO CAPS: 100.0000 mg | ORAL_CAPSULE | Freq: Three times a day (TID) | ORAL | 0 refills | Status: DC | PRN

## 2016-10-04 MED ORDER — IPRATROPIUM-ALBUTEROL 0.5-2.5 (3) MG/3ML IN SOLN
3.0000 mL | Freq: Four times a day (QID) | RESPIRATORY_TRACT | Status: DC
  Administered 2016-10-04: 3 mL via RESPIRATORY_TRACT

## 2016-10-04 MED ORDER — ALBUTEROL SULFATE (2.5 MG/3ML) 0.083% IN NEBU
2.5000 mg | INHALATION_SOLUTION | Freq: Four times a day (QID) | RESPIRATORY_TRACT | 0 refills | Status: DC | PRN
Start: 2016-10-04 — End: 2019-11-21

## 2016-10-04 MED ORDER — PREDNISONE 10 MG PO TABS: ORAL_TABLET | ORAL | 0 refills | Status: DC

## 2016-10-04 MED ORDER — DOXYCYCLINE HYCLATE 100 MG PO CAPS: 100.0000 mg | ORAL_CAPSULE | Freq: Two times a day (BID) | ORAL | 0 refills | Status: DC

## 2016-10-04 NOTE — ED Provider Notes (Signed)
[facility] ____________________________________________  Time seen: Approximately 9:34 AM  I have reviewed the triage vital signs and the nursing notes.   HISTORY  Chief Complaint Shortness of Breath; Wheezing; and Nasal Congestion   HPI Yvonne Rios is a 26 y.o. female presenting for 3-4 weeks of runny nose, nasal congestion, postnasal drip passive lead to a cough for the last few days. States she feels like her seasonal allergies were initiating this. Patient reports her last 2-3 days she has noticed that she has had some wheezing present with her cough. Patient states cough is worse at night with associated postnasal drainage. Reports has had some sinus pressure and sinus discomfort during this process. States that occasionally coughing up greenish phlegm, greenish phlegm when blowing nose. Denies known fevers. Denies known sick contacts. States chest feels tight with associated wheezing and states feels consistent with previous asthma and bronchitis. States some soreness from coughing but denies atypical chest pains. Reports continues to eat and drink well. Reports unresolved with over-the-counter medications and her allergy medicines. Reports has occasionally used home albuterol inhaler, with last use being this morning.  Denies abdominal pain, dysuria, extremity pain, extremity swelling or rash. Denies recent immobilization, hemoptysis, recent surgeries or recent trips. Denies recent sickness. Denies recent antibiotic use.   [facility] PCP Patient's last menstrual period was 09/26/2016 (within days). Denies pregnancy.   Past Medical History:  Diagnosis Date  . ADD (attention deficit disorder)   . Anxiety   . Asthma   . Depression   . GERD (gastroesophageal reflux disease)   . IBS (irritable bowel syndrome)   . Migraine   . MRSA (methicillin resistant staph aureus) culture positive   . Oligomenorrhea   . Pyloric stenosis     There are no active  problems to display for this patient.   Past Surgical History:  Procedure Laterality Date  . COLPOSCOPY    . DILATION AND CURETTAGE OF UTERUS  age 26  . HYSTEROSCOPY  2015  . TONSILLECTOMY    . WISDOM TOOTH EXTRACTION       No current facility-administered medications for this encounter.   Current Outpatient Prescriptions:  .  ADDERALL XR 20 MG 24 hr capsule, Take 1 capsule by mouth 2 (two) times daily., Disp: , Rfl: 0 .  albuterol (PROVENTIL HFA;VENTOLIN HFA) 108 (90 Base) MCG/ACT inhaler, Inhale 2 puffs into the lungs every 6 (six) hours as needed for wheezing or shortness of breath., Disp: 1 Inhaler, Rfl: 0 .  AMITIZA 8 MCG capsule, Take 1 capsule by mouth 2 (two) times daily., Disp: , Rfl: 0 .  amitriptyline (ELAVIL) 50 MG tablet, Take 50 mg by mouth at bedtime., Disp: , Rfl:  .  baclofen (LIORESAL) 10 MG tablet, Take 1-2 tablets by mouth at bedtime., Disp: , Rfl: 0 .  beclomethasone (QVAR) 80 MCG/ACT inhaler, Inhale 2 puffs into the lungs 2 (two) times daily., Disp: , Rfl:  .  clonazePAM (KLONOPIN) 1 MG tablet, Take 1 mg by mouth 3 (three) times daily as needed for anxiety., Disp: , Rfl:  .  fluticasone (FLONASE) 50 MCG/ACT nasal spray, Place 1 spray into both nostrils 2 (two) times daily., Disp: 16 g, Rfl: 0 .  Fluticasone-Salmeterol (ADVAIR) 250-50 MCG/DOSE AEPB, Inhale 1 puff into the lungs daily., Disp: , Rfl:  .  gabapentin (NEURONTIN) 300 MG capsule, Take 1 capsule by mouth 2 (two) times daily., Disp: , Rfl: 0 .  lamoTRIgine (LAMICTAL) 200 MG tablet, Take 200 mg by  mouth 2 (two) times daily., Disp: , Rfl:  .  montelukast (SINGULAIR) 10 MG tablet, Take 10 mg by mouth at bedtime., Disp: , Rfl:  .  nabumetone (RELAFEN) 750 MG tablet, Take 1 tablet by mouth 2 (two) times daily., Disp: , Rfl: 0 .  rizatriptan (MAXALT) 10 MG tablet, Take 1 tablet by mouth every 2 (two) hours as needed., Disp: , Rfl: 0   Allergies Cefprozil; Amoxil [amoxicillin]; and Augmentin [amoxicillin-pot  clavulanate]  Family History  Problem Relation Age of Onset  . Ovarian cancer Mother 4    Social History Social History  Substance Use Topics  . Smoking status: Never Smoker  . Smokeless tobacco: Never Used  . Alcohol use No     Comment: rarely    Review of Systems Constitutional: No fever/chills ENT: No sore throat. Cardiovascular: Denies chest pain. Respiratory: As above. Gastrointestinal: No abdominal pain.  No nausea, no vomiting.   Genitourinary: Negative for dysuria. Musculoskeletal: Negative for back pain. Skin: Negative for rash.  ____________________________________________   PHYSICAL EXAM:  VITAL SIGNS: ED Triage Vitals  Enc Vitals Group     BP 10/04/16 0906 112/69     Pulse Rate 10/04/16 0906 (!) 58     Resp 10/04/16 0906 20     Temp 10/04/16 0906 99.2 F (37.3 C)     Temp Source 10/04/16 0906 Oral     SpO2 10/04/16 0906 98 %     Weight 10/04/16 0907 (!) 330 lb (149.7 kg)     Height 10/04/16 0907  (1.727 m)     Head Circumference --      Peak Flow --      Pain Score --      Pain Loc --      Pain Edu? --      Excl. in GC? --     Constitutional: Alert and oriented. Well appearing and in no acute distress. Eyes: Conjunctivae are normal. PERRL. EOMI. Head: Atraumatic.Mild to moderate tenderness to palpation bilateral frontal and maxillary sinuses. No swelling. No erythema.   Ears: no erythema, normal TMs bilaterally.   Nose: nasal congestion with bilateral nasal turbinate erythema and edema.   Mouth/Throat: Mucous membranes are moist.  Oropharynx non-erythematous.No tonsillar swelling or exudate.  Neck: No stridor.  No cervical spine tenderness to palpation. Hematological/Lymphatic/Immunilogical: No cervical lymphadenopathy. Cardiovascular: Normal rate, regular rhythm. Grossly normal heart sounds.  Good peripheral circulation. Respiratory: Normal respiratory effort.  No retractions. Good air movement. Diffuse expiratory wheezes. Mild scattered  rhonchi. Dry intermittent cough noted in room. Speaks in complete sentences. Gastrointestinal: Soft and nontender. Obese abdomen. No CVA tenderness. Musculoskeletal: No lower extremity tenderness nor edema.  Steady gait. Bilateral pedal pulses equal and easily palpated. No cervical, thoracic or lumbar tenderness to palpation.  Neurologic:  Normal speech and language.No gait instability. Skin:  Skin is warm, dry and intact. No rash noted. Psychiatric: Mood and affect are normal. Speech and behavior are normal.  ___________________________________________   LABS (all labs ordered are listed, but only abnormal results are displayed)  Labs Reviewed - No data to display ____________________________________________  RADIOLOGY  Dg Chest 2 View  Result Date: 10/04/2016 CLINICAL DATA:  Cough, wheezing. EXAM: CHEST  2 VIEW COMPARISON:  10/17/2015 FINDINGS: Heart and mediastinal contours are within normal limits. No focal opacities or effusions. No acute bony abnormality. IMPRESSION: No active cardiopulmonary disease. Electronically Signed   By: Charlett Nose M.D.   On: 10/04/2016 09:55   ____________________________________________   PROCEDURES Procedures  INITIAL IMPRESSION / ASSESSMENT AND PLAN / ED COURSE  Pertinent labs & imaging results that were available during my care of the patient were reviewed by me and considered in my medical decision making (see chart for details).  Well-appearing patient. No acute chest. History of asthma as well as seasonal allergies, and suspect sinusitis with secondary asthma exacerbation. Will evaluate chest x-ray. DuoNeb given once in urgent care.  Chest xray per radiologist, no active cardiopulmonary disease. After duoneb, patient reevaluated and wheezes much improved, and patient reports feeling much better and states chest tightness has resolved. Denies current shortness of breath or chest pain. Suspect sinusitis and asthmatic bronchitis, will treat  with oral prednisone taper, doxycycline, prn albuterol and prn tessalon perles. Patient states she has plenty of albuterol inhaler at home, but out of albuterol nebulizing solution and requests solution, rx given. Discussed indication, risks and benefits of medications with patient.Encouraged rest, fluids, and pcp follow up.   Discussed follow up with Primary care physician this week. Discussed follow up and return parameters including no resolution or any worsening concerns. Patient verbalized understanding and agreed to plan.   ____________________________________________   FINAL CLINICAL IMPRESSION(S) / ED DIAGNOSES  Final diagnoses:  Acute maxillary sinusitis, recurrence not specified  Asthmatic bronchitis with acute exacerbation, unspecified asthma severity, unspecified whether persistent     Discharge Medication List as of 10/04/2016 10:22 AM    START taking these medications   Details  albuterol (PROVENTIL) (2.5 MG/3ML) 0.083% nebulizer solution Take 3 mLs (2.5 mg total) by nebulization every 6 (six) hours as needed for wheezing or shortness of breath., Starting Mon 10/04/2016, Normal    benzonatate (TESSALON PERLES) 100 MG capsule Take 1 capsule (100 mg total) by mouth 3 (three) times daily as needed for cough., Starting Mon 10/04/2016, Normal    doxycycline (VIBRAMYCIN) 100 MG capsule Take 1 capsule (100 mg total) by mouth 2 (two) times daily., Starting Mon 10/04/2016, Normal    predniSONE (DELTASONE) 10 MG tablet Start 60 mg po day one, then 50 mg po day two, taper by 10 mg daily until complete., Normal        Note: This dictation was prepared with Dragon dictation along with smaller phrase technology. Any transcriptional errors that result from this process are unintentional.         Renford Dills, NP 10/04/16 [phone]

## 2016-10-04 NOTE — ED Triage Notes (Signed)
Runny nose and URI type symptoms x1 month. Past 2 days pt has had wheezing, dyspnea, productive cough- yellow, and chest congestion. Denies fever.

## 2016-10-04 NOTE — Discharge Instructions (Signed)
Take medication as prescribed. Rest. Drink plenty of fluids.  ° °Follow up with your primary care physician this week as needed. Return to Urgent care for new or worsening concerns.  ° °

## 2017-01-03 ENCOUNTER — Ambulatory Visit: Payer: Medicaid Other | Admitting: Obstetrics and Gynecology

## 2017-03-08 ENCOUNTER — Encounter: Payer: Self-pay | Admitting: Emergency Medicine

## 2017-03-08 ENCOUNTER — Ambulatory Visit
Admission: EM | Admit: 2017-03-08 | Discharge: 2017-03-08 | Disposition: A | Discharge status: Home / Self Care | Payer: Medicaid Other | Attending: Family Medicine | Admitting: Family Medicine

## 2017-03-08 DIAGNOSIS — Z79899 Other long term (current) drug therapy: Secondary | ICD-10-CM | POA: Insufficient documentation

## 2017-03-08 DIAGNOSIS — Z880 Allergy status to penicillin: Secondary | ICD-10-CM | POA: Insufficient documentation

## 2017-03-08 DIAGNOSIS — J029 Acute pharyngitis, unspecified: Secondary | ICD-10-CM | POA: Diagnosis present

## 2017-03-08 DIAGNOSIS — J0100 Acute maxillary sinusitis, unspecified: Secondary | ICD-10-CM | POA: Insufficient documentation

## 2017-03-08 DIAGNOSIS — R0981 Nasal congestion: Secondary | ICD-10-CM | POA: Diagnosis present

## 2017-03-08 LAB — RAPID STREP SCREEN (MED CTR MEBANE ONLY): STREPTOCOCCUS, GROUP A SCREEN (DIRECT): NEGATIVE

## 2017-03-08 MED ORDER — DOXYCYCLINE HYCLATE 100 MG PO CAPS: 100.0000 mg | ORAL_CAPSULE | Freq: Two times a day (BID) | ORAL | 0 refills | Status: DC

## 2017-03-08 NOTE — ED Triage Notes (Signed)
Patient in today c/o sore throat, nasal congestion and bilateral ear pain x 3 days. Patient started with fever this am. Patient has tried Dayquil and Nyquil without much relief.

## 2017-03-08 NOTE — Discharge Instructions (Signed)
Antibiotic as prescribed.  Take care  Dr. Foy Vanduyne  

## 2017-03-08 NOTE — ED Provider Notes (Signed)
[facility]    CSN: [id number] Arrival date & time: 03/08/17  7829  History   Chief Complaint Chief Complaint  Patient presents with  . Sore Throat  . Nasal Congestion   HPI 26 year old female presents with the above complaints.  Patient states that she's been sick for the past 3 days. She reports that she's had several sick contacts as her son has recently been sick and she works at a daycare. She's had sore throat, rhinorrhea, nasal discharge, ear pain, and congestion. Symptoms are severe. She's used Motrin and DayQuil and NyQuil without relief. She reports subjective fever this morning. She did not take her temperature. No known exacerbating factors. No other associated symptoms. No other complaints or concerns at this time.  Past Medical History:  Diagnosis Date  . ADD (attention deficit disorder)   . Anxiety   . Asthma   . Depression   . GERD (gastroesophageal reflux disease)   . IBS (irritable bowel syndrome)   . Migraine   . MRSA (methicillin resistant staph aureus) culture positive   . Oligomenorrhea   . Pyloric stenosis    Past Surgical History:  Procedure Laterality Date  . COLPOSCOPY    . DILATION AND CURETTAGE OF UTERUS  age 52  . HYSTEROSCOPY  2015  . TONSILLECTOMY    . WISDOM TOOTH EXTRACTION     OB History    Gravida Para Term Preterm AB Living   SAB TAB Ectopic Multiple Live Births   2       1     Home Medications    Prior to Admission medications   Medication Sig Start Date End Date Taking? Authorizing Provider  ADDERALL XR 20 MG 24 hr capsule Take 1 capsule by mouth 2 (two) times daily. 03/05/15  Yes [provider]  albuterol (PROVENTIL HFA;VENTOLIN HFA) 108 (90 Base) MCG/ACT inhaler Inhale 2 puffs into the lungs every 6 (six) hours as needed for wheezing or shortness of breath. 10/17/15  Yes Hassan Rowan, MD  AMITIZA 8 MCG capsule Take 1 capsule by mouth 2 (two) times daily. 03/14/15  Yes [provider]    amitriptyline (ELAVIL) 50 MG tablet Take 50 mg by mouth at bedtime.   Yes [provider]  baclofen (LIORESAL) 10 MG tablet Take 1-2 tablets by mouth at bedtime. 03/06/15  Yes [provider]  clonazePAM (KLONOPIN) 1 MG tablet Take 1 mg by mouth 3 (three) times daily as needed for anxiety.   Yes [provider]  fluticasone (FLONASE) 50 MCG/ACT nasal spray Place 1 spray into both nostrils 2 (two) times daily. 06/23/15  Yes Cuthriell, Delorise Royals, PA-C  Fluticasone-Salmeterol (ADVAIR) 250-50 MCG/DOSE AEPB Inhale 1 puff into the lungs daily.   Yes [provider]  gabapentin (NEURONTIN) 300 MG capsule Take 1 capsule by mouth 2 (two) times daily. 03/13/15  Yes [provider]  lamoTRIgine (LAMICTAL) 200 MG tablet Take 200 mg by mouth 2 (two) times daily.   Yes [provider]  montelukast (SINGULAIR) 10 MG tablet Take 10 mg by mouth at bedtime.   Yes [provider]  nabumetone (RELAFEN) 750 MG tablet Take 1 tablet by mouth 2 (two) times daily. 03/06/15  Yes [provider]  propranolol (INDERAL) 80 MG tablet Take 80 mg by mouth daily.   Yes [provider]  albuterol (PROVENTIL) (2.5 MG/3ML) 0.083% nebulizer solution Take 3 mLs (2.5 mg total) by nebulization every 6 (  six) hours as needed for wheezing or shortness of breath. 10/04/16   Renford Dills, NP  doxycycline (VIBRAMYCIN) 100 MG capsule Take 1 capsule (100 mg total) by mouth 2 (two) times daily. 03/08/17   Tommie Sams, DO  rizatriptan (MAXALT) 10 MG tablet Take 1 tablet by mouth every 2 (two) hours as needed. 03/06/15   [provider]    Family History Family History  Problem Relation Age of Onset  . Ovarian cancer Mother 16    Social History Social History  Substance Use Topics  . Smoking status: Never Smoker  . Smokeless tobacco: Never Used  . Alcohol use No     Comment: rarely   Allergies   Cefprozil; Amoxil [amoxicillin]; and Augmentin  [amoxicillin-pot clavulanate]  Review of Systems Review of Systems  Constitutional: Positive for fever.  HENT: Positive for congestion, ear pain, sinus pressure and sore throat.   Respiratory: Negative.     Physical Exam Triage Vital Signs ED Triage Vitals  Enc Vitals Group     BP 03/08/17 0928 115/69     Pulse Rate 03/08/17 0928 67     Resp 03/08/17 0928 16     Temp 03/08/17 0928 98.2 F (36.8 C)     Temp Source 03/08/17 0928 Oral     SpO2 03/08/17 0928 99 %     Weight 03/08/17 0930 300 lb (136.1 kg)     Height 03/08/17 0930  (1.727 m)     Head Circumference --      Peak Flow --      Pain Score 03/08/17 0930 8     Pain Loc --      Pain Edu? --      Excl. in GC? --    Updated Vital Signs BP 115/69 (BP Location: Left Arm)   Pulse 67   Temp 98.2 F (36.8 C) (Oral)   Resp 16   Ht  (1.727 m)   Wt 300 lb (136.1 kg)   LMP 02/28/2017 (Approximate)   SpO2 99%   BMI 45.61 kg/m    Physical Exam  Constitutional: She is oriented to person, place, and time.  Morbidly obese female in no acute distress.  HENT:  Head: Normocephalic and atraumatic.  Normal TMs bilaterally. Oropharynx clear. Severe left maxillary sinus tenderness to palpation/percussion.  Eyes: Conjunctivae are normal.  Neck: Neck supple.  Anterior cervical tenderness to palpation. No adenopathy.  Cardiovascular: Normal rate and regular rhythm.   No murmur heard. Pulmonary/Chest: Effort normal. She has no wheezes. She has no rales.  Neurological: She is alert and oriented to person, place, and time.  Vitals reviewed.  UC Treatments / Results  Labs (all labs ordered are listed, but only abnormal results are displayed) Labs Reviewed  RAPID STREP SCREEN (NOT AT [facility])  CULTURE, GROUP A STREP [facility])    EKG  EKG Interpretation None      Radiology No results found.  Procedures Procedures (including critical care time)  Medications Ordered in UC Medications - No data to  display  Initial Impression / Assessment and Plan / UC Course  I have reviewed the triage vital signs and the nursing notes.  Pertinent labs & imaging results that were available during my care of the patient were reviewed by me and considered in my medical decision making (see chart for details).   26 year old female presents with upper respiratory symptoms. Has severe maxillary sinus tenderness to palpation/percussion. Suspect sinusitis. Rapid strep negative. Treating with Doxy.  Work note given.   Final Clinical Impressions(s) / UC Diagnoses   Final diagnoses:  Acute maxillary sinusitis, recurrence not specified   Meds ordered this encounter  Medications  . doxycycline (VIBRAMYCIN) 100 MG capsule    Sig: Take 1 capsule (100 mg total) by mouth 2 (two) times daily.    Dispense:  20 capsule    Refill:  0   Controlled Substance Prescriptions [place] Controlled Substance Registry consulted? Not Applicable   Tommie Sams, DO 03/08/17 1002

## 2017-03-10 LAB — CULTURE, GROUP A STREP (THRC)

## 2017-04-15 ENCOUNTER — Encounter: Payer: Self-pay | Admitting: Obstetrics and Gynecology

## 2017-04-18 NOTE — Telephone Encounter (Signed)
Pt's hsb, Yvonne Rios, calling re pt's bleeding issues.  Should she be seen?  Please call.  [phone]. Pt states she had a period 14d ago and started bleeding again last week x5d, stopped today, not a nl period, not nl period odor, no odor, faint line on preg test yesterday, lightheaded/nausea yesterday.  Adv to repeat preg test and go from there, monitor sxs if they worsen to be seen.  Since bleeding stopped today no need to be worked in.

## 2017-04-19 NOTE — Telephone Encounter (Signed)
If she has a positive UPT she can be scheduled for a NOB in the next few weeks

## 2017-05-01 ENCOUNTER — Ambulatory Visit
Admission: EM | Admit: 2017-05-01 | Discharge: 2017-05-01 | Disposition: A | Discharge status: Home / Self Care | Payer: Medicaid Other | Attending: Emergency Medicine | Admitting: Emergency Medicine

## 2017-05-01 ENCOUNTER — Encounter: Payer: Self-pay | Admitting: Emergency Medicine

## 2017-05-01 DIAGNOSIS — Z880 Allergy status to penicillin: Secondary | ICD-10-CM | POA: Diagnosis not present

## 2017-05-01 DIAGNOSIS — R0981 Nasal congestion: Secondary | ICD-10-CM | POA: Diagnosis present

## 2017-05-01 DIAGNOSIS — J0140 Acute pansinusitis, unspecified: Secondary | ICD-10-CM | POA: Diagnosis not present

## 2017-05-01 DIAGNOSIS — Z79899 Other long term (current) drug therapy: Secondary | ICD-10-CM | POA: Diagnosis not present

## 2017-05-01 DIAGNOSIS — J45901 Unspecified asthma with (acute) exacerbation: Secondary | ICD-10-CM | POA: Insufficient documentation

## 2017-05-01 DIAGNOSIS — R05 Cough: Secondary | ICD-10-CM | POA: Diagnosis present

## 2017-05-01 LAB — RAPID STREP SCREEN (MED CTR MEBANE ONLY): Streptococcus, Group A Screen (Direct): NEGATIVE

## 2017-05-01 LAB — MONONUCLEOSIS SCREEN: MONO SCREEN: NEGATIVE

## 2017-05-01 LAB — PREGNANCY, URINE: Preg Test, Ur: NEGATIVE

## 2017-05-01 MED ORDER — PREDNISONE 10 MG (21) PO TBPK: ORAL_TABLET | ORAL | 0 refills | Status: DC

## 2017-05-01 MED ORDER — FLUTICASONE PROPIONATE 50 MCG/ACT NA SUSP: 2.0000 | Freq: Every day | NASAL | 0 refills | Status: DC

## 2017-05-01 MED ORDER — AEROCHAMBER PLUS MISC: 2 refills | Status: AC

## 2017-05-01 MED ORDER — ALBUTEROL SULFATE HFA 108 (90 BASE) MCG/ACT IN AERS: 2.0000 | INHALATION_SPRAY | RESPIRATORY_TRACT | 0 refills | Status: AC | PRN

## 2017-05-01 MED ORDER — DOXYCYCLINE HYCLATE 100 MG PO CAPS: 100.0000 mg | ORAL_CAPSULE | Freq: Two times a day (BID) | ORAL | 0 refills | Status: AC

## 2017-05-01 NOTE — Discharge Instructions (Signed)
Take the medication as written.  Use a neti pot or the NeilMed sinus rinse as often as you want to to reduce nasal congestion. Follow the directions on the box.   start an antihistamine/decongestant combination of her choice such as Claritin -D, allegra-D, Zyrtec-D, saline nasal irrigation.  As for the asthma attack, 1-2 puffs from her albuterol inhaler using her spacer and a 6 day prednisone taper.   Go to www.goodrx.com to look up your medications. This will give you a list of where you can find your prescriptions at the most affordable prices. Or you can ask the pharmacist what the cash price is. This is frequently cheaper than going through insurance.

## 2017-05-01 NOTE — ED Triage Notes (Signed)
Sore throat, runny nose,wheezing nasal congestion for 2 weeks.

## 2017-05-01 NOTE — ED Provider Notes (Signed)
HPI  SUBJECTIVE:  Yvonne Rios is a 26 y.o. female who presents with swollen, sore throat, yellowish nasal congestion, cough productive of clear mucus, wheezing, chest tightness and soreness secondary to the cough for 2 weeks.  She reports frontal headaches.  Her husband has identical symptoms.  Patient has tried multiple OTC cold medicines and her albuterol which helps with her wheezing.  She does not have a a spacer with her inhaler symptoms are worse with swallowing, eating,.  No cervical lymphadenopathy, drooling, trismus, sensation of throat swelling shut, voice changes, abdominal pain, rash, body aches.  No sinus pain or pressure, upper dental pain.  No GERD or allergy type symptoms.  No antibiotics in the past month.  No antipyretic in the past 6-8 hours.  No contacts with strep or mono.  She has a past medical history of GERD, allergies, obesity, asthma, diabetes, sinusitis, migraines.  No history of smoking, hypertension, mono.  She is status post tonsillectomy.  LMP: 10/26.  Unsure if she could be pregnant.  PMD: Leafy Half    Past Medical History:  Diagnosis Date  . ADD (attention deficit disorder)   . Anxiety   . Asthma   . Depression   . GERD (gastroesophageal reflux disease)   . IBS (irritable bowel syndrome)   . Migraine   . MRSA (methicillin resistant staph aureus) culture positive   . Oligomenorrhea   . Pyloric stenosis     Past Surgical History:  Procedure Laterality Date  . COLPOSCOPY    . DILATION AND CURETTAGE OF UTERUS  age 80  . HYSTEROSCOPY  2015  . TONSILLECTOMY    . WISDOM TOOTH EXTRACTION      Family History  Problem Relation Age of Onset  . Ovarian cancer Mother 40    Social History   Tobacco Use  . Smoking status: Never Smoker  . Smokeless tobacco: Never Used  Substance Use Topics  . Alcohol use: No    Alcohol/week: 0.0 oz    Comment: rarely  . Drug use: No    No current facility-administered medications for this encounter.    Current Outpatient Medications:  .  ADDERALL XR 20 MG 24 hr capsule, Take 1 capsule by mouth 2 (two) times daily., Disp: , Rfl: 0 .  albuterol (PROVENTIL) (2.5 MG/3ML) 0.083% nebulizer solution, Take 3 mLs (2.5 mg total) by nebulization every 6 (six) hours as needed for wheezing or shortness of breath., Disp: 75 mL, Rfl: 0 .  AMITIZA 8 MCG capsule, Take 1 capsule by mouth 2 (two) times daily., Disp: , Rfl: 0 .  amitriptyline (ELAVIL) 50 MG tablet, Take 50 mg by mouth at bedtime., Disp: , Rfl:  .  baclofen (LIORESAL) 10 MG tablet, Take 1-2 tablets by mouth at bedtime., Disp: , Rfl: 0 .  clonazePAM (KLONOPIN) 1 MG tablet, Take 1 mg by mouth 3 (three) times daily as needed for anxiety., Disp: , Rfl:  .  Fluticasone-Salmeterol (ADVAIR) 250-50 MCG/DOSE AEPB, Inhale 1 puff into the lungs daily., Disp: , Rfl:  .  gabapentin (NEURONTIN) 300 MG capsule, Take 1 capsule by mouth 2 (two) times daily., Disp: , Rfl: 0 .  lamoTRIgine (LAMICTAL) 200 MG tablet, Take 200 mg by mouth 2 (two) times daily., Disp: , Rfl:  .  montelukast (SINGULAIR) 10 MG tablet, Take 10 mg by mouth at bedtime., Disp: , Rfl:  .  nabumetone (RELAFEN) 750 MG tablet, Take 1 tablet by mouth 2 (two) times daily., Disp: , Rfl: 0 .  propranolol (INDERAL) 80 MG tablet, Take 80 mg by mouth daily., Disp: , Rfl:  .  rizatriptan (MAXALT) 10 MG tablet, Take 1 tablet by mouth every 2 (two) hours as needed., Disp: , Rfl: 0 .  albuterol (PROVENTIL HFA;VENTOLIN HFA) 108 (90 Base) MCG/ACT inhaler, Inhale 2 puffs into the lungs every 4 (four) hours as needed for wheezing or shortness of breath., Disp: 1 Inhaler, Rfl: 0 .  doxycycline (VIBRAMYCIN) 100 MG capsule, Take 1 capsule (100 mg total) by mouth 2 (two) times daily for 7 days., Disp: 14 capsule, Rfl: 0 .  fluticasone (FLONASE) 50 MCG/ACT nasal spray, Place 2 sprays into both nostrils daily., Disp: 16 g, Rfl: 0 .  predniSONE (STERAPRED UNI-PAK 21 TAB) 10 MG (21) TBPK tablet, Dispense one 6 day pack.  Take as directed with food., Disp: 21 tablet, Rfl: 0 .  Spacer/Aero-Holding Chambers (AEROCHAMBER PLUS) inhaler, Use as instructed, Disp: 1 each, Rfl: 2  Allergies  Allergen Reactions  . Cefprozil Other (See Comments)  . Amoxil [Amoxicillin] Rash  . Augmentin [Amoxicillin-Pot Clavulanate] Rash     ROS  As noted in HPI.   Physical Exam  BP 121/85 (BP Location: Left Arm)   Pulse 68   Temp 98.9 F (37.2 C)   Resp 16   Ht 5\' 8"  (1.727 m)   Wt 300 lb (136.1 kg)   SpO2 98%   BMI 45.61 kg/m   Constitutional: Well developed, well nourished, no acute distress Eyes:  EOMI, conjunctiva normal bilaterally HENT: Normocephalic, atraumatic,mucus membranes moist.  Positive purulent nasal congestion, positive maxillary and frontal sinus tenderness.  Tonsils surgically absent, uvula midline.  Erythematous oropharynx, cobblestoning. Neck: No cervical lymphadenopathy Respiratory: Normal inspiratory effort.  Diffuse expiratory wheezing, chest wall tenderness Cardiovascular: Normal rate regular rhythm no murmurs rubs or gallops GI: nondistended.  No appreciable splenomegaly skin: No rash, skin intact Musculoskeletal: no deformities Neurologic: Alert & oriented x 3, no focal neuro deficits Psychiatric: Speech and behavior appropriate   ED Course   Medications - No data to display  Orders Placed This Encounter  Procedures  . Rapid strep screen    Standing Status:   Standing    Number of Occurrences:   1  . Culture, group A strep    Standing Status:   Standing    Number of Occurrences:   1  . Pregnancy, urine    Standing Status:   Standing    Number of Occurrences:   1  . Mononucleosis screen    Standing Status:   Standing    Number of Occurrences:   1    Results for orders placed or performed during the hospital encounter of 05/01/17 (from the past 24 hour(s))  Rapid strep screen     Status: None   Collection Time: 05/01/17  1:04 PM  Result Value Ref Range   Streptococcus,  Group A Screen (Direct) NEGATIVE NEGATIVE  Pregnancy, urine     Status: None   Collection Time: 05/01/17  1:18 PM  Result Value Ref Range   Preg Test, Ur NEGATIVE NEGATIVE  Mononucleosis screen     Status: None   Collection Time: 05/01/17  1:45 PM  Result Value Ref Range   Mono Screen NEGATIVE NEGATIVE   No results found.  ED Clinical Impression  Acute pansinusitis, recurrence not specified  Mild asthma with acute exacerbation, unspecified whether persistent   ED Assessment/Plan  Urine pregnancy negative.  Strep negative.  Mono negative.  Presentation consistent with a URI that has  transitioned to a sinusitis and asthma exacerbation.  We will send home with doxycycline, Flonase, she is to start an antihistamine/decongestant combination of her choice such as Claritin, Allegra, Zyrtec-D, saline nasal irrigation.  As for the asthma, 1-2 puffs from her albuterol inhaler using her spacer and a 6 day prednisone taper.  Follow up with primary care physician as needed, to the ER if she gets worse.  Discussed labs, MDM, plan and followup with patient. Discussed sn/sx that should prompt return to the ED. patient agrees with plan.   Meds ordered this encounter  Medications  . albuterol (PROVENTIL HFA;VENTOLIN HFA) 108 (90 Base) MCG/ACT inhaler    Sig: Inhale 2 puffs into the lungs every 4 (four) hours as needed for wheezing or shortness of breath.    Dispense:  1 Inhaler    Refill:  0  . Spacer/Aero-Holding Chambers (AEROCHAMBER PLUS) inhaler    Sig: Use as instructed    Dispense:  1 each    Refill:  2  . predniSONE (STERAPRED UNI-PAK 21 TAB) 10 MG (21) TBPK tablet    Sig: Dispense one 6 day pack. Take as directed with food.    Dispense:  21 tablet    Refill:  0  . fluticasone (FLONASE) 50 MCG/ACT nasal spray    Sig: Place 2 sprays into both nostrils daily.    Dispense:  16 g    Refill:  0  . doxycycline (VIBRAMYCIN) 100 MG capsule    Sig: Take 1 capsule (100 mg total) by mouth 2  (two) times daily for 7 days.    Dispense:  14 capsule    Refill:  0    *This clinic note was created using Scientist, clinical (histocompatibility and immunogenetics)Dragon dictation software. Therefore, there may be occasional mistakes despite careful proofreading.   ?   Domenick GongMortenson, Syrita Dovel, MD 05/01/17 [phone]

## 2017-05-04 ENCOUNTER — Encounter: Payer: Self-pay | Admitting: Obstetrics and Gynecology

## 2017-05-04 ENCOUNTER — Ambulatory Visit: Payer: Medicaid Other | Admitting: Obstetrics and Gynecology

## 2017-05-04 VITALS — BP 128/84 | Ht 68.0 in | Wt 298.0 lb

## 2017-05-04 DIAGNOSIS — O0281 Inappropriate change in quantitative human chorionic gonadotropin (hCG) in early pregnancy: Secondary | ICD-10-CM | POA: Diagnosis not present

## 2017-05-04 LAB — CULTURE, GROUP A STREP (THRC)

## 2017-05-06 NOTE — Progress Notes (Signed)
Obstetrics & Gynecology Office Visit   Chief Complaint:  Chief Complaint  Patient presents with  . Follow-up  . Menstrual Problem    History of Present Illness: 26 year old G3P1021 who had a positive home pregnancy test at home and bleeding, she was subsequently seen in ER for sinusitis and was noted to have a negative pregnancy test.  The patient and her husband were last seen in April for an infertility consult.  She has a long standing history of menorrhagia and irregular menstrual cycles.  In April cycles had regulated, and she reports they remained regular monthly.  She presents today to discuss implications of positive UPT followed by negative UPT  Review of Systems: review of systems negative unless otherwise negative  Past Medical History:  Past Medical History:  Diagnosis Date  . ADD (attention deficit disorder)   . Anxiety   . Asthma   . Depression   . GERD (gastroesophageal reflux disease)   . IBS (irritable bowel syndrome)   . Migraine   . MRSA (methicillin resistant staph aureus) culture positive   . Oligomenorrhea   . Pyloric stenosis     Past Surgical History:  Past Surgical History:  Procedure Laterality Date  . COLPOSCOPY    . DILATION AND CURETTAGE OF UTERUS  age 26  . HYSTEROSCOPY  2015  . TONSILLECTOMY    . WISDOM TOOTH EXTRACTION      Gynecologic History: Patient's last menstrual period was 04/01/2017.  Obstetric History: [id number]  Family History:  Family History  Problem Relation Age of Onset  . Ovarian cancer Mother 6830    Social History:  Social History   Socioeconomic History  . Marital status: Married    Spouse name: Not on file  . Number of children: Not on file  . Years of education: Not on file  . Highest education level: Not on file  Social Needs  . Financial resource strain: Not on file  . Food insecurity - worry: Not on file  . Food insecurity - inability: Not on file  . Transportation needs - medical: Not on file  .  Transportation needs - non-medical: Not on file  Occupational History  . Not on file  Tobacco Use  . Smoking status: Never Smoker  . Smokeless tobacco: Never Used  Substance and Sexual Activity  . Alcohol use: No    Alcohol/week: 0.0 oz    Comment: rarely  . Drug use: No  . Sexual activity: Yes  Other Topics Concern  . Not on file  Social History Narrative  . Not on file    Allergies:  Allergies  Allergen Reactions  . Cefprozil Other (See Comments)  . Amoxil [Amoxicillin] Rash  . Augmentin [Amoxicillin-Pot Clavulanate] Rash    Medications: Prior to Admission medications   Medication Sig Start Date End Date Taking? Authorizing Provider  ADDERALL XR 20 MG 24 hr capsule Take 1 capsule by mouth 2 (two) times daily. 03/05/15   [provider]  albuterol (PROVENTIL HFA;VENTOLIN HFA) 108 (90 Base) MCG/ACT inhaler Inhale 2 puffs into the lungs every 4 (four) hours as needed for wheezing or shortness of breath. 05/01/17   Domenick GongMortenson, Ashley, MD  albuterol (PROVENTIL) (2.5 MG/3ML) 0.083% nebulizer solution Take 3 mLs (2.5 mg total) by nebulization every 6 (six) hours as needed for wheezing or shortness of breath. 10/04/16   Renford DillsMiller, Lindsey, NP  AMITIZA 8 MCG capsule Take 1 capsule by mouth 2 (two) times daily. 03/14/15   [provider]  amitriptyline (ELAVIL) 50 MG tablet Take 50 mg by mouth at bedtime.    [provider]  baclofen (LIORESAL) 10 MG tablet Take 1-2 tablets by mouth at bedtime. 03/06/15   [provider]  clonazePAM (KLONOPIN) 1 MG tablet Take 1 mg by mouth 3 (three) times daily as needed for anxiety.    [provider]  doxycycline (VIBRAMYCIN) 100 MG capsule Take 1 capsule (100 mg total) by mouth 2 (two) times daily for 7 days. 05/01/17 05/08/17  Domenick GongMortenson, Ashley, MD  fluticasone (FLONASE) 50 MCG/ACT nasal spray Place 2 sprays into both nostrils daily. 05/01/17   Domenick GongMortenson, Ashley, MD  Fluticasone-Salmeterol (ADVAIR) 250-50  MCG/DOSE AEPB Inhale 1 puff into the lungs daily.    [provider]  gabapentin (NEURONTIN) 300 MG capsule Take 1 capsule by mouth 2 (two) times daily. 03/13/15   [provider]  lamoTRIgine (LAMICTAL) 200 MG tablet Take 200 mg by mouth 2 (two) times daily.    [provider]  montelukast (SINGULAIR) 10 MG tablet Take 10 mg by mouth at bedtime.    [provider]  nabumetone (RELAFEN) 750 MG tablet Take 1 tablet by mouth 2 (two) times daily. 03/06/15   [provider]  predniSONE (STERAPRED UNI-PAK 21 TAB) 10 MG (21) TBPK tablet Dispense one 6 day pack. Take as directed with food. 05/01/17   Domenick GongMortenson, Ashley, MD  propranolol (INDERAL) 80 MG tablet Take 80 mg by mouth daily.    [provider]  rizatriptan (MAXALT) 10 MG tablet Take 1 tablet by mouth every 2 (two) hours as needed. 03/06/15   [provider]  Spacer/Aero-Holding Chambers (AEROCHAMBER PLUS) inhaler Use as instructed 05/01/17   Domenick GongMortenson, Ashley, MD    Physical Exam Vitals:  Vitals:   05/04/17 1507  BP: 128/84   Patient's last menstrual period was 04/01/2017.  General: NAD HEENT: normocephalic, anicteric Pulmonary: No increased work of breathing Extremities: no edema, erythema, or tenderness Neurologic: Grossly intact Psychiatric: mood appropriate, affect full  Female chaperone present for pelvic and breast  portions of the physical exam  Assessment: 26 y.o. [id number] with failed early chemical pregnancy  Plan: Problem List Items Addressed This Visit    None    Visit Diagnoses    Chemical pregnancy    -  Primary     1) Chemical pregnancy - based on the patient's positive home pregnancy followed by negative ED pregnancy test this most likely represents and early chemical pregnancy that either did not implant of failed shortly after implantation.  It does show that the patient is ovulating and viable sperm was present for fertilization to occur.  I continued  to stress weightloss in helping with conception.  Also discussed use of OPK's. Will monitor to see if next menses occurs on time.  2) A total of 10 minutes were spent in face-to-face contact with the patient during this encounter with over half of that time devoted to counseling and coordination of care.

## 2017-05-12 ENCOUNTER — Encounter: Payer: Self-pay | Admitting: Obstetrics and Gynecology

## 2017-06-12 ENCOUNTER — Encounter: Payer: Self-pay | Admitting: Obstetrics and Gynecology

## 2017-06-14 ENCOUNTER — Encounter: Payer: Self-pay | Admitting: Obstetrics and Gynecology

## 2017-06-17 ENCOUNTER — Other Ambulatory Visit: Payer: Self-pay | Admitting: Obstetrics and Gynecology

## 2017-06-17 DIAGNOSIS — O3680X0 Pregnancy with inconclusive fetal viability, not applicable or unspecified: Secondary | ICD-10-CM

## 2017-06-17 NOTE — Telephone Encounter (Signed)
HCG is in if we can given her an appointment time

## 2017-07-08 ENCOUNTER — Other Ambulatory Visit: Payer: Self-pay

## 2017-07-08 ENCOUNTER — Ambulatory Visit
Admission: EM | Admit: 2017-07-08 | Discharge: 2017-07-08 | Disposition: A | Discharge status: Home / Self Care | Payer: Medicaid Other | Attending: Family Medicine | Admitting: Family Medicine

## 2017-07-08 ENCOUNTER — Ambulatory Visit: Payer: Medicaid Other

## 2017-07-08 ENCOUNTER — Encounter: Payer: Self-pay | Admitting: Emergency Medicine

## 2017-07-08 DIAGNOSIS — Z880 Allergy status to penicillin: Secondary | ICD-10-CM | POA: Diagnosis not present

## 2017-07-08 DIAGNOSIS — F419 Anxiety disorder, unspecified: Secondary | ICD-10-CM | POA: Diagnosis not present

## 2017-07-08 DIAGNOSIS — R079 Chest pain, unspecified: Secondary | ICD-10-CM | POA: Diagnosis not present

## 2017-07-08 DIAGNOSIS — J45909 Unspecified asthma, uncomplicated: Secondary | ICD-10-CM | POA: Insufficient documentation

## 2017-07-08 DIAGNOSIS — J40 Bronchitis, not specified as acute or chronic: Secondary | ICD-10-CM | POA: Diagnosis not present

## 2017-07-08 DIAGNOSIS — R062 Wheezing: Secondary | ICD-10-CM | POA: Insufficient documentation

## 2017-07-08 DIAGNOSIS — R0989 Other specified symptoms and signs involving the circulatory and respiratory systems: Secondary | ICD-10-CM | POA: Diagnosis present

## 2017-07-08 DIAGNOSIS — Z79899 Other long term (current) drug therapy: Secondary | ICD-10-CM | POA: Insufficient documentation

## 2017-07-08 DIAGNOSIS — F329 Major depressive disorder, single episode, unspecified: Secondary | ICD-10-CM | POA: Diagnosis not present

## 2017-07-08 DIAGNOSIS — J069 Acute upper respiratory infection, unspecified: Secondary | ICD-10-CM | POA: Insufficient documentation

## 2017-07-08 DIAGNOSIS — K589 Irritable bowel syndrome without diarrhea: Secondary | ICD-10-CM | POA: Insufficient documentation

## 2017-07-08 DIAGNOSIS — K219 Gastro-esophageal reflux disease without esophagitis: Secondary | ICD-10-CM | POA: Diagnosis not present

## 2017-07-08 DIAGNOSIS — R05 Cough: Secondary | ICD-10-CM | POA: Diagnosis not present

## 2017-07-08 DIAGNOSIS — F988 Other specified behavioral and emotional disorders with onset usually occurring in childhood and adolescence: Secondary | ICD-10-CM | POA: Diagnosis not present

## 2017-07-08 MED ORDER — HYDROCOD POLST-CPM POLST ER 10-8 MG/5ML PO SUER: 5.0000 mL | Freq: Two times a day (BID) | ORAL | 0 refills | Status: DC

## 2017-07-08 MED ORDER — BENZONATATE 200 MG PO CAPS: ORAL_CAPSULE | ORAL | 0 refills | Status: DC

## 2017-07-08 MED ORDER — IPRATROPIUM-ALBUTEROL 0.5-2.5 (3) MG/3ML IN SOLN: 3.0000 mL | Freq: Once | RESPIRATORY_TRACT | Status: DC

## 2017-07-08 NOTE — ED Triage Notes (Signed)
Patient in today c/o productive cough (green), chest congestion, wheezing. Patient was PCP last week and treated for bronchitis. Patient on Doxy and Prednisone.

## 2017-07-08 NOTE — ED Provider Notes (Signed)
[facility]    CSN: [id number] Arrival date & time: 07/08/17  1417     History   Chief Complaint Chief Complaint  Patient presents with  . Cough    HPI Yvonne Rios is a 27 y.o. female.   HPI  27 year old female presents with a productive cough of green sputum chest congestion and wheezing.  He was seen at her primary care physician last week treated for bronchitis with doxycycline and prednisone.  She states that she has not improved and feels that she is worsened.  She has coughing when recumbent and  will awaken her at nighttime.  She has noticed wheezing         Past Medical History:  Diagnosis Date  . ADD (attention deficit disorder)   . Anxiety   . Asthma   . Depression   . GERD (gastroesophageal reflux disease)   . IBS (irritable bowel syndrome)   . Migraine   . MRSA (methicillin resistant staph aureus) culture positive   . Oligomenorrhea   . Pyloric stenosis     There are no active problems to display for this patient.   Past Surgical History:  Procedure Laterality Date  . COLPOSCOPY    . DILATION AND CURETTAGE OF UTERUS  age 26  . HYSTEROSCOPY  2015  . TONSILLECTOMY    . WISDOM TOOTH EXTRACTION      OB History    Gravida Para Term Preterm AB Living   3 1 1   2 1    SAB TAB Ectopic Multiple Live Births   2       1       Home Medications    Prior to Admission medications   Medication Sig Start Date End Date Taking? Authorizing Provider  ADDERALL XR 20 MG 24 hr capsule Take 1 capsule by mouth 2 (two) times daily. 03/05/15  Yes [provider]  albuterol (PROVENTIL HFA;VENTOLIN HFA) 108 (90 Base) MCG/ACT inhaler Inhale 2 puffs into the lungs every 4 (four) hours as needed for wheezing or shortness of breath. 05/01/17  Yes Domenick Gong, MD  albuterol (PROVENTIL) (2.5 MG/3ML) 0.083% nebulizer solution Take 3 mLs (2.5 mg total) by nebulization every 6 (six) hours as needed for wheezing or shortness of breath.  10/04/16  Yes Renford Dills, NP  AMITIZA 8 MCG capsule Take 1 capsule by mouth 2 (two) times daily. 03/14/15  Yes [provider]  amitriptyline (ELAVIL) 50 MG tablet Take 50 mg by mouth at bedtime.   Yes [provider]  baclofen (LIORESAL) 10 MG tablet Take 1-2 tablets by mouth at bedtime. 03/06/15  Yes [provider]  clonazePAM (KLONOPIN) 1 MG tablet Take 1 mg by mouth 3 (three) times daily as needed for anxiety.   Yes [provider]  Erenumab-aooe (AIMOVIG) 70 MG/ML SOAJ Inject 1 Dose into the skin every 28 (twenty-eight) days.   Yes [provider]  fluticasone (FLONASE) 50 MCG/ACT nasal spray Place 2 sprays into both nostrils daily. 05/01/17  Yes Domenick Gong, MD  Fluticasone-Salmeterol (ADVAIR) 250-50 MCG/DOSE AEPB Inhale 1 puff into the lungs daily.   Yes [provider]  gabapentin (NEURONTIN) 300 MG capsule Take 1 capsule by mouth 2 (two) times daily. 03/13/15  Yes [provider]  lamoTRIgine (LAMICTAL) 200 MG tablet Take 200 mg by mouth 2 (two) times daily.   Yes [provider]  predniSONE (STERAPRED UNI-PAK 21 TAB) 10 MG (21) TBPK tablet Dispense one 6 day pack. Take as  directed with food. 05/01/17  Yes Domenick GongMortenson, Ashley, MD  propranolol (INDERAL) 80 MG tablet Take 80 mg by mouth daily.   Yes [provider]  rizatriptan (MAXALT) 10 MG tablet Take 1 tablet by mouth every 2 (two) hours as needed. 03/06/15  Yes [provider]  benzonatate (TESSALON) 200 MG capsule Take one cap TID PRN cough 07/08/17   Lutricia Feiloemer, Makahla Kiser P, PA-C  chlorpheniramine-HYDROcodone [facility](TUSSIONEX PENNKINETIC ER) 10-8 MG/5ML SUER Take 5 mLs by mouth 2 (two) times daily. 07/08/17   Lutricia Feiloemer, Manny Vitolo P, PA-C  Spacer/Aero-Holding Chambers (AEROCHAMBER PLUS) inhaler Use as instructed 05/01/17   Domenick GongMortenson, Ashley, MD    Family History Family History  Problem Relation Age of Onset  . Ovarian cancer Mother 5230    Social  History Social History   Tobacco Use  . Smoking status: Never Smoker  . Smokeless tobacco: Never Used  Substance Use Topics  . Alcohol use: Yes    Alcohol/week: 0.0 oz    Comment: rarely  . Drug use: No     Allergies   Cefprozil; Amoxil [amoxicillin]; and Augmentin [amoxicillin-pot clavulanate]   Review of Systems Review of Systems  Constitutional: Positive for activity change, chills and fatigue.  HENT: Positive for congestion and postnasal drip.   Respiratory: Positive for cough, shortness of breath and wheezing.   All other systems reviewed and are negative.    Physical Exam Triage Vital Signs ED Triage Vitals [07/08/17 1435]  Enc Vitals Group     BP 120/68     Pulse Rate 72     Resp 16     Temp 97.8 F (36.6 C)     Temp Source Oral     SpO2 97 %     Weight 300 lb (136.1 kg)     Height 5\' 8"  (1.727 m)     Head Circumference      Peak Flow      Pain Score 8     Pain Loc      Pain Edu?      Excl. in GC?    No data found.  Updated Vital Signs BP 120/68 (BP Location: Left Arm)   Pulse 72   Temp 97.8 F (36.6 C) (Oral)   Resp 16   Ht 5\' 8"  (1.727 m)   Wt 300 lb (136.1 kg)   LMP 06/12/2017 Comment: denies preg  SpO2 97%   BMI 45.61 kg/m   Visual Acuity Right Eye Distance:   Left Eye Distance:   Bilateral Distance:    Right Eye Near:   Left Eye Near:    Bilateral Near:     Physical Exam  Constitutional: She is oriented to person, place, and time. She appears well-developed and well-nourished. No distress.  HENT:  Head: Normocephalic.  Right Ear: External ear normal.  Left Ear: External ear normal.  Nose: Nose normal.  Mouth/Throat: Oropharynx is clear and moist. No oropharyngeal exudate.  Eyes: Pupils are equal, round, and reactive to light. Right eye exhibits no discharge. Left eye exhibits no discharge.  Neck: Normal range of motion.  Pulmonary/Chest: Effort normal. No respiratory distress. She has wheezes. She has rales.   Musculoskeletal: Normal range of motion.  Neurological: She is alert and oriented to person, place, and time.  Skin: Skin is warm and dry. She is not diaphoretic.  Psychiatric: She has a normal mood and affect. Her behavior is normal. Judgment and thought content normal.  Nursing note and vitals reviewed.    UC Treatments /  Results  Labs (all labs ordered are listed, but only abnormal results are displayed) Labs Reviewed - No data to display  EKG  EKG Interpretation None       Radiology Dg Chest 2 View  Result Date: 07/08/2017 CLINICAL DATA:  Bronchitis 1 week ago. Right lower posterior chest pain. EXAM: CHEST  2 VIEW COMPARISON:  10/04/2016 FINDINGS: Cardiomediastinal silhouette is normal. Mediastinal contours appear intact. There is no evidence of focal airspace consolidation, pleural effusion or pneumothorax. Osseous structures are without acute abnormality. Soft tissues are grossly normal. IMPRESSION: No active cardiopulmonary disease. Electronically Signed   By: Ted Mcalpine M.D.   On: 07/08/2017 16:20    Procedures Procedures (including critical care time)  Medications Ordered in UC Medications  ipratropium-albuterol (DUONEB) 0.5-2.5 (3) MG/3ML nebulizer solution 3 mL (not administered)     Initial Impression / Assessment and Plan / UC Course  I have reviewed the triage vital signs and the nursing notes.  Pertinent labs & imaging results that were available during my care of the patient were reviewed by me and considered in my medical decision making (see chart for details).     Plan: 1. Test/x-ray results and diagnosis reviewed with patient 2. rx as per orders; risks, benefits, potential side effects reviewed with patient 3. Recommend supportive treatment with rest and fluids.  Albuterol inhalers as necessary for shortness of breath.  Recommend finishing out the doxycycline and prednisone.  Will treat symptomatically her cough.  She has high fevers or is  not improving in a week or 2 she should follow-up with her primary care physician . 4. F/u prn if symptoms worsen or don't improve   Final Clinical Impressions(s) / UC Diagnoses   Final diagnoses:  Upper respiratory tract infection, unspecified type    ED Discharge Orders        Ordered    benzonatate (TESSALON) 200 MG capsule     07/08/17 1645    chlorpheniramine-HYDROcodone (TUSSIONEX PENNKINETIC ER) 10-8 MG/5ML SUER  2 times daily     07/08/17 1645       Controlled Substance Prescriptions [place] Controlled Substance Registry consulted? Not Applicable   Lutricia Feil, PA-C 07/08/17 1610

## 2017-08-22 ENCOUNTER — Encounter: Payer: Self-pay | Admitting: Obstetrics and Gynecology

## 2017-08-26 ENCOUNTER — Ambulatory Visit
Admission: EM | Admit: 2017-08-26 | Discharge: 2017-08-26 | Disposition: A | Discharge status: Home / Self Care | Payer: Worker's Compensation | Attending: Family Medicine | Admitting: Family Medicine

## 2017-08-26 ENCOUNTER — Encounter: Payer: Self-pay | Admitting: *Deleted

## 2017-08-26 ENCOUNTER — Ambulatory Visit (INDEPENDENT_AMBULATORY_CARE_PROVIDER_SITE_OTHER): Payer: Worker's Compensation

## 2017-08-26 DIAGNOSIS — S300XXA Contusion of lower back and pelvis, initial encounter: Secondary | ICD-10-CM | POA: Diagnosis not present

## 2017-08-26 DIAGNOSIS — W19XXXA Unspecified fall, initial encounter: Secondary | ICD-10-CM | POA: Diagnosis not present

## 2017-08-26 DIAGNOSIS — M545 Low back pain, unspecified: Secondary | ICD-10-CM

## 2017-08-26 DIAGNOSIS — M79604 Pain in right leg: Secondary | ICD-10-CM

## 2017-08-26 DIAGNOSIS — S39012A Strain of muscle, fascia and tendon of lower back, initial encounter: Secondary | ICD-10-CM

## 2017-08-26 LAB — PREGNANCY, URINE: Preg Test, Ur: NEGATIVE

## 2017-08-26 MED ORDER — CYCLOBENZAPRINE HCL 10 MG PO TABS: 10.0000 mg | ORAL_TABLET | Freq: Two times a day (BID) | ORAL | 0 refills | Status: DC | PRN

## 2017-08-26 MED ORDER — TRAMADOL HCL 50 MG PO TABS: 50.0000 mg | ORAL_TABLET | Freq: Four times a day (QID) | ORAL | 0 refills | Status: DC | PRN

## 2017-08-26 NOTE — ED Provider Notes (Signed)
[facility]    CSN: [id number] Arrival date & time: 08/26/17  1733     History   Chief Complaint Chief Complaint  Patient presents with  . Back Pain  . Leg Injury    HPI Yvonne Rios is a 27 y.o. female.    Fall  This is a new problem. The current episode started 6 to 12 hours ago. The problem occurs constantly. The problem has been gradually worsening. Pertinent negatives include no chest pain, no abdominal pain, no headaches and no shortness of breath. The symptoms are aggravated by bending and twisting. Nothing relieves the symptoms. She has tried nothing for the symptoms.  Back Pain  Location:  Lumbar spine Quality:  Aching Radiates to:  R posterior upper leg Pain severity:  Moderate Pain is:  Same all the time Onset quality:  Sudden Timing:  Constant Progression:  Waxing and waning Relieved by:  Being still Worsened by:  Bending and twisting Associated symptoms: no abdominal pain, no chest pain, no dysuria, no fever, no headaches, no numbness, no pelvic pain and no weakness     Past Medical History:  Diagnosis Date  . ADD (attention deficit disorder)   . Anxiety   . Asthma   . Depression   . GERD (gastroesophageal reflux disease)   . IBS (irritable bowel syndrome)   . Migraine   . MRSA (methicillin resistant staph aureus) culture positive   . Oligomenorrhea   . Pyloric stenosis     There are no active problems to display for this patient.   Past Surgical History:  Procedure Laterality Date  . COLPOSCOPY    . DILATION AND CURETTAGE OF UTERUS  age 27  . HYSTEROSCOPY  2015  . TONSILLECTOMY    . WISDOM TOOTH EXTRACTION      OB History    Gravida  3   Para  1   Term  1   Preterm      AB  2   Living  1     SAB  2   TAB      Ectopic      Multiple      Live Births  1            Home Medications    Prior to Admission medications   Medication Sig Start Date End Date Taking? Authorizing Provider  ADDERALL  XR 20 MG 24 hr capsule Take 1 capsule by mouth 2 (two) times daily. 03/05/15   [provider]  albuterol (PROVENTIL HFA;VENTOLIN HFA) 108 (90 Base) MCG/ACT inhaler Inhale 2 puffs into the lungs every 4 (four) hours as needed for wheezing or shortness of breath. 05/01/17   Domenick GongMortenson, Ashley, MD  albuterol (PROVENTIL) (2.5 MG/3ML) 0.083% nebulizer solution Take 3 mLs (2.5 mg total) by nebulization every 6 (six) hours as needed for wheezing or shortness of breath. 10/04/16   Renford DillsMiller, Lindsey, NP  AMITIZA 8 MCG capsule Take 1 capsule by mouth 2 (two) times daily. 03/14/15   [provider]  amitriptyline (ELAVIL) 50 MG tablet Take 50 mg by mouth at bedtime.    [provider]  baclofen (LIORESAL) 10 MG tablet Take 1-2 tablets by mouth at bedtime. 03/06/15   [provider]  benzonatate (TESSALON) 200 MG capsule Take one cap TID PRN cough 07/08/17   Lutricia Feiloemer, William P, PA-C  chlorpheniramine-HYDROcodone [facility](TUSSIONEX PENNKINETIC ER) 10-8 MG/5ML SUER Take 5 mLs by mouth 2 (two) times daily. 07/08/17   Lutricia Feiloemer, William P, PA-C  clonazePAM (KLONOPIN) 1 MG tablet Take 1 mg by mouth 3 (three) times daily as needed for anxiety.    [provider]  cyclobenzaprine (FLEXERIL) 10 MG tablet Take 1 tablet (10 mg total) by mouth 2 (two) times daily as needed for muscle spasms. 08/26/17   Duanne Limerick, MD  Erenumab-aooe (AIMOVIG) 70 MG/ML SOAJ Inject 1 Dose into the skin every 28 (twenty-eight) days.    [provider]  fluticasone (FLONASE) 50 MCG/ACT nasal spray Place 2 sprays into both nostrils daily. 05/01/17   Domenick Gong, MD  Fluticasone-Salmeterol (ADVAIR) 250-50 MCG/DOSE AEPB Inhale 1 puff into the lungs daily.    [provider]  gabapentin (NEURONTIN) 300 MG capsule Take 1 capsule by mouth 2 (two) times daily. 03/13/15   [provider]  lamoTRIgine (LAMICTAL) 200 MG tablet Take 200 mg by mouth 2 (two) times daily.    [provider]    predniSONE (STERAPRED UNI-PAK 21 TAB) 10 MG (21) TBPK tablet Dispense one 6 day pack. Take as directed with food. 05/01/17   Domenick Gong, MD  propranolol (INDERAL) 80 MG tablet Take 80 mg by mouth daily.    [provider]  rizatriptan (MAXALT) 10 MG tablet Take 1 tablet by mouth every 2 (two) hours as needed. 03/06/15   [provider]  Spacer/Aero-Holding Chambers (AEROCHAMBER PLUS) inhaler Use as instructed 05/01/17   Domenick Gong, MD  traMADol (ULTRAM) 50 MG tablet Take 1 tablet (50 mg total) by mouth every 6 (six) hours as needed. 08/26/17   Duanne Limerick, MD    Family History Family History  Problem Relation Age of Onset  . Ovarian cancer Mother 60  . Other Father     Social History Social History   Tobacco Use  . Smoking status: Never Smoker  . Smokeless tobacco: Never Used  Substance Use Topics  . Alcohol use: Yes    Alcohol/week: 0.0 oz    Comment: rarely  . Drug use: No     Allergies   Cefprozil; Amoxil [amoxicillin]; and Augmentin [amoxicillin-pot clavulanate]   Review of Systems Review of Systems  Constitutional: Negative.  Negative for chills, fatigue, fever and unexpected weight change.  HENT: Negative for congestion, ear discharge, ear pain, rhinorrhea, sinus pressure, sneezing and sore throat.   Eyes: Negative for photophobia, pain, discharge, redness and itching.  Respiratory: Negative for cough, shortness of breath, wheezing and stridor.   Cardiovascular: Negative for chest pain.  Gastrointestinal: Negative for abdominal pain, blood in stool, constipation, diarrhea, nausea and vomiting.  Endocrine: Negative for cold intolerance, heat intolerance, polydipsia, polyphagia and polyuria.  Genitourinary: Negative for dysuria, flank pain, frequency, hematuria, menstrual problem, pelvic pain, urgency, vaginal bleeding and vaginal discharge.  Musculoskeletal: Positive for back pain. Negative for arthralgias and myalgias.  Skin:  Negative for rash.  Allergic/Immunologic: Negative for environmental allergies and food allergies.  Neurological: Negative for dizziness, weakness, light-headedness, numbness and headaches.  Hematological: Negative for adenopathy. Does not bruise/bleed easily.  Psychiatric/Behavioral: Negative for dysphoric mood. The patient is not nervous/anxious.      Physical Exam Triage Vital Signs ED Triage Vitals  Enc Vitals Group     BP 08/26/17 1814 115/66     Pulse Rate 08/26/17 1814 67     Resp 08/26/17 1814 16     Temp 08/26/17 1814 98.7 F (37.1 C)     Temp Source 08/26/17 1814 Oral     SpO2 08/26/17 1814 99 %     Weight 08/26/17 1816 Marland Kitchen)  330 lb (149.7 kg)     Height 08/26/17 1816 5\' 8"  (1.727 m)     Head Circumference --      Peak Flow --      Pain Score 08/26/17 1815 8     Pain Loc --      Pain Edu? --      Excl. in GC? --    No data found.  Updated Vital Signs BP 115/66 (BP Location: Left Arm)   Pulse 67   Temp 98.7 F (37.1 C) (Oral)   Resp 16   Ht 5\' 8"  (1.727 m)   Wt (!) 330 lb (149.7 kg)   SpO2 99%   BMI 50.18 kg/m   Visual Acuity Right Eye Distance:   Left Eye Distance:   Bilateral Distance:    Right Eye Near:   Left Eye Near:    Bilateral Near:     Physical Exam  Constitutional: No distress.  HENT:  Head: Normocephalic and atraumatic.  Right Ear: External ear normal.  Left Ear: External ear normal.  Nose: Nose normal.  Mouth/Throat: Oropharynx is clear and moist.  Eyes: Pupils are equal, round, and reactive to light. Conjunctivae and EOM are normal. Right eye exhibits no discharge. Left eye exhibits no discharge.  Neck: Normal range of motion. Neck supple. No JVD present. No thyromegaly present.  Cardiovascular: Normal rate, regular rhythm, normal heart sounds and intact distal pulses. Exam reveals no gallop and no friction rub.  No murmur heard. Pulmonary/Chest: Effort normal and breath sounds normal.  Abdominal: Soft. Bowel sounds are normal. She  exhibits no mass. There is no tenderness. There is no guarding.  Musculoskeletal: Normal range of motion. She exhibits no edema.       Lumbar back: She exhibits tenderness and spasm. She exhibits no bony tenderness.  Lymphadenopathy:    She has no cervical adenopathy.  Neurological: She is alert. She has normal strength and normal reflexes. No sensory deficit.  Skin: Skin is warm and dry. She is not diaphoretic.  Nursing note and vitals reviewed.    UC Treatments / Results  Labs (all labs ordered are listed, but only abnormal results are displayed) Labs Reviewed  PREGNANCY, URINE    EKG None Radiology Dg Lumbar Spine Complete  Result Date: 08/26/2017 CLINICAL DATA:  Low lumbar pain EXAM: LUMBAR SPINE - COMPLETE 4+ VIEW COMPARISON:  MRI 02/19/2013 FINDINGS: Suspected transitional anatomy. For the purposes of reporting, last well-formed vertebra will be designated L5. Grade 1 anterolisthesis of L5 on S1 with suspected bilateral pars defect. Mild degenerative changes at L5-S1. Vertebral body heights are normal. IMPRESSION: 1. No definite acute osseous abnormality 2. Suspected transitional anatomy. 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 Electronically Signed   By: Jasmine Pang M.D.   On: 08/26/2017 20:14    Procedures Procedures (including critical care time)  Medications Ordered in UC Medications - No data to display   Initial Impression / Assessment and Plan / UC Course  I have reviewed the triage vital signs and the nursing notes.  Pertinent labs & imaging results that were available during my care of the patient were reviewed by me and considered in my medical decision making (see chart for details).       Final Clinical Impressions(s) / UC Diagnoses   Final diagnoses:  Acute midline low back pain without sciatica    ED Discharge Orders        Ordered  cyclobenzaprine (FLEXERIL) 10 MG tablet  2 times daily PRN     08/26/17  2027    traMADol (ULTRAM) 50 MG tablet  Every 6 hours PRN     08/26/17 2027       Controlled Substance Prescriptions [place] Controlled Substance Registry consulted?    Duanne Limerick, MD 08/26/17 2036

## 2017-08-26 NOTE — ED Triage Notes (Signed)
Pt lost balance and fell today landing on back. Now c/o low back and right leg pain.

## 2017-09-02 ENCOUNTER — Other Ambulatory Visit: Payer: Self-pay | Admitting: Obstetrics and Gynecology

## 2017-09-02 ENCOUNTER — Encounter: Payer: Self-pay | Admitting: Obstetrics and Gynecology

## 2017-09-02 MED ORDER — MEDROXYPROGESTERONE ACETATE 10 MG PO TABS: 10.0000 mg | ORAL_TABLET | Freq: Every day | ORAL | 0 refills | Status: DC

## 2017-11-17 ENCOUNTER — Ambulatory Visit
Admission: EM | Admit: 2017-11-17 | Discharge: 2017-11-17 | Disposition: A | Discharge status: Home / Self Care | Payer: Medicaid Other | Attending: Family Medicine | Admitting: Family Medicine

## 2017-11-17 ENCOUNTER — Other Ambulatory Visit: Payer: Self-pay

## 2017-11-17 ENCOUNTER — Encounter: Payer: Self-pay | Admitting: Emergency Medicine

## 2017-11-17 DIAGNOSIS — J3489 Other specified disorders of nose and nasal sinuses: Secondary | ICD-10-CM

## 2017-11-17 DIAGNOSIS — Z880 Allergy status to penicillin: Secondary | ICD-10-CM | POA: Diagnosis not present

## 2017-11-17 DIAGNOSIS — Z79899 Other long term (current) drug therapy: Secondary | ICD-10-CM | POA: Diagnosis not present

## 2017-11-17 DIAGNOSIS — J988 Other specified respiratory disorders: Secondary | ICD-10-CM | POA: Diagnosis not present

## 2017-11-17 DIAGNOSIS — F419 Anxiety disorder, unspecified: Secondary | ICD-10-CM | POA: Insufficient documentation

## 2017-11-17 DIAGNOSIS — H9209 Otalgia, unspecified ear: Secondary | ICD-10-CM | POA: Insufficient documentation

## 2017-11-17 DIAGNOSIS — F329 Major depressive disorder, single episode, unspecified: Secondary | ICD-10-CM | POA: Insufficient documentation

## 2017-11-17 DIAGNOSIS — F988 Other specified behavioral and emotional disorders with onset usually occurring in childhood and adolescence: Secondary | ICD-10-CM | POA: Insufficient documentation

## 2017-11-17 DIAGNOSIS — J029 Acute pharyngitis, unspecified: Secondary | ICD-10-CM | POA: Diagnosis present

## 2017-11-17 DIAGNOSIS — K219 Gastro-esophageal reflux disease without esophagitis: Secondary | ICD-10-CM | POA: Insufficient documentation

## 2017-11-17 DIAGNOSIS — J45909 Unspecified asthma, uncomplicated: Secondary | ICD-10-CM | POA: Diagnosis not present

## 2017-11-17 DIAGNOSIS — K589 Irritable bowel syndrome without diarrhea: Secondary | ICD-10-CM | POA: Diagnosis not present

## 2017-11-17 DIAGNOSIS — H9203 Otalgia, bilateral: Secondary | ICD-10-CM | POA: Diagnosis not present

## 2017-11-17 LAB — RAPID STREP SCREEN (MED CTR MEBANE ONLY): Streptococcus, Group A Screen (Direct): NEGATIVE

## 2017-11-17 MED ORDER — AZITHROMYCIN 250 MG PO TABS: ORAL_TABLET | ORAL | 0 refills | Status: DC

## 2017-11-17 NOTE — ED Provider Notes (Signed)
[facility]    CSN: [id number] Arrival date & time: 11/17/17  1531  History   Chief Complaint Chief Complaint  Patient presents with  . Sore Throat    HPI  27 year old female presents with sore throat, ear pain, discolored nasal discharge.  Patient reports a 2-week history of the above complaints.  She states that she is tried over-the-counter lozenges, Sudafed, Mucinex without improvement.  No fever.  She states that her symptoms are severe, particularly the sore throat and ear pain.  Patient states that she has a history of sinusitis.  No known exacerbating factors.  No other associated symptoms.  No other complaints.  Past Medical History:  Diagnosis Date  . ADD (attention deficit disorder)   . Anxiety   . Asthma   . Depression   . GERD (gastroesophageal reflux disease)   . IBS (irritable bowel syndrome)   . Migraine   . MRSA (methicillin resistant staph aureus) culture positive   . Oligomenorrhea   . Pyloric stenosis    Past Surgical History:  Procedure Laterality Date  . COLPOSCOPY    . DILATION AND CURETTAGE OF UTERUS  age 47  . HYSTEROSCOPY  2015  . TONSILLECTOMY    . WISDOM TOOTH EXTRACTION     OB History    Gravida  3   Para  1   Term  1   Preterm      AB  2   Living  1     SAB  2   TAB      Ectopic      Multiple      Live Births  1           Home Medications    Prior to Admission medications   Medication Sig Start Date End Date Taking? Authorizing Provider  ADDERALL XR 20 MG 24 hr capsule Take 1 capsule by mouth 2 (two) times daily. 03/05/15  Yes [provider]  albuterol (PROVENTIL HFA;VENTOLIN HFA) 108 (90 Base) MCG/ACT inhaler Inhale 2 puffs into the lungs every 4 (four) hours as needed for wheezing or shortness of breath. 05/01/17  Yes Domenick Gong, MD  albuterol (PROVENTIL) (2.5 MG/3ML) 0.083% nebulizer solution Take 3 mLs (2.5 mg total) by nebulization every 6 (six) hours as needed for wheezing or  shortness of breath. 10/04/16  Yes Renford Dills, NP  amitriptyline (ELAVIL) 50 MG tablet Take 50 mg by mouth at bedtime.   Yes [provider]  baclofen (LIORESAL) 10 MG tablet Take 1-2 tablets by mouth at bedtime. 03/06/15  Yes [provider]  clonazePAM (KLONOPIN) 1 MG tablet Take 1 mg by mouth 3 (three) times daily as needed for anxiety.   Yes [provider]  Erenumab-aooe (AIMOVIG) 70 MG/ML SOAJ Inject 1 Dose into the skin every 28 (twenty-eight) days.   Yes [provider]  fluticasone (FLONASE) 50 MCG/ACT nasal spray Place 2 sprays into both nostrils daily. 05/01/17  Yes Domenick Gong, MD  Fluticasone-Salmeterol (ADVAIR) 250-50 MCG/DOSE AEPB Inhale 1 puff into the lungs daily.   Yes [provider]  gabapentin (NEURONTIN) 300 MG capsule Take 1 capsule by mouth 2 (two) times daily. 03/13/15  Yes [provider]  rizatriptan (MAXALT) 10 MG tablet Take 1 tablet by mouth every 2 (two) hours as needed. 03/06/15  Yes [provider]  Spacer/Aero-Holding Chambers (AEROCHAMBER PLUS) inhaler Use as instructed 05/01/17  Yes Domenick Gong, MD  azithromycin (ZITHROMAX) 250 MG tablet 2 tablets on day 1, then  1 tablet daily on days 2-5. 11/17/17   Tommie Sams, DO    Family History Family History  Problem Relation Age of Onset  . Ovarian cancer Mother 54  . Other Father     Social History Social History   Tobacco Use  . Smoking status: Never Smoker  . Smokeless tobacco: Never Used  Substance Use Topics  . Alcohol use: Yes    Alcohol/week: 0.0 oz    Comment: rarely  . Drug use: No     Allergies   Cefprozil; Amoxil [amoxicillin]; and Augmentin [amoxicillin-pot clavulanate]   Review of Systems Review of Systems  Constitutional: Negative.   HENT: Positive for ear pain and sore throat.    Physical Exam Triage Vital Signs ED Triage Vitals  Enc Vitals Group     BP 11/17/17 1557 (!) 124/59     Pulse Rate 11/17/17  1557 65     Resp 11/17/17 1557 18     Temp 11/17/17 1557 98.9 F (37.2 C)     Temp Source 11/17/17 1557 Oral     SpO2 11/17/17 1557 98 %     Weight 11/17/17 1554 300 lb (136.1 kg)     Height 11/17/17 1554 5\' 8"  (1.727 m)     Head Circumference --      Peak Flow --      Pain Score 11/17/17 1554 8     Pain Loc --      Pain Edu? --      Excl. in GC? --    Updated Vital Signs BP (!) 124/59 (BP Location: Left Arm)   Pulse 65   Temp 98.9 F (37.2 C) (Oral)   Resp 18   Ht 5\' 8"  (1.727 m)   Wt 300 lb (136.1 kg)   LMP 11/05/2017 (Exact Date)   SpO2 98%   BMI 45.61 kg/m   Physical Exam  Constitutional: She is oriented to person, place, and time. She appears well-developed. No distress.  HENT:  Head: Normocephalic and atraumatic.  Nose: Nose normal.  Mouth/Throat: Oropharynx is clear and moist.  Normal TM's.  Cardiovascular: Normal rate and regular rhythm.  Pulmonary/Chest: Effort normal and breath sounds normal. She has no wheezes. She has no rales.  Neurological: She is alert and oriented to person, place, and time.  Psychiatric: She has a normal mood and affect. Her behavior is normal.  Nursing note and vitals reviewed.  UC Treatments / Results  Labs (all labs ordered are listed, but only abnormal results are displayed) Labs Reviewed  RAPID STREP SCREEN ([facility] ONLY)  CULTURE, GROUP A STREP [facility])   EKG None  Radiology No results found.  Procedures Procedures (including critical care time)  Medications Ordered in UC Medications - No data to display  Initial Impression / Assessment and Plan / UC Course  I have reviewed the triage vital signs and the nursing notes.  Pertinent labs & imaging results that were available during my care of the patient were reviewed by me and considered in my medical decision making (see chart for details).    27 year old female presents with a respiratory infection. Likely viral.  Continue symptomatic treatment with  over-the-counter medications.  Azithromycin if she fails to improve or worsens.  Final Clinical Impressions(s) / UC Diagnoses   Final diagnoses:  Respiratory infection     Discharge Instructions     Antibiotic if you fail to improve or worsen.  Continue symptomatic treatment.  Take care  Dr. Adriana Simas  ED Prescriptions    Medication Sig Dispense Auth. Provider   azithromycin (ZITHROMAX) 250 MG tablet 2 tablets on day 1, then 1 tablet daily on days 2-5. 6 tablet Tommie Samsook, [place] Cost G, DO     Controlled Substance Prescriptions [place] Controlled Substance Registry consulted? Not Applicable   Tommie SamsCook, Delanee Xin G, DO 11/17/17 1639

## 2017-11-17 NOTE — Discharge Instructions (Signed)
Antibiotic if you fail to improve or worsen.  Continue symptomatic treatment.  Take care  Dr. Adriana Simasook

## 2017-11-17 NOTE — ED Triage Notes (Signed)
Patient c/o sore throat x 2 weeks and bilateral ear pain x 1 week.

## 2017-11-20 LAB — CULTURE, GROUP A STREP (THRC)

## 2017-12-07 DIAGNOSIS — E119 Type 2 diabetes mellitus without complications: Secondary | ICD-10-CM | POA: Insufficient documentation

## 2017-12-07 DIAGNOSIS — H5212 Myopia, left eye: Secondary | ICD-10-CM | POA: Insufficient documentation

## 2018-01-28 ENCOUNTER — Emergency Department
Admission: EM | Admit: 2018-01-28 | Discharge: 2018-01-28 | Disposition: A | Discharge status: Home / Self Care | Payer: Medicaid Other | Attending: Emergency Medicine | Admitting: Emergency Medicine

## 2018-01-28 ENCOUNTER — Encounter: Payer: Self-pay | Admitting: Emergency Medicine

## 2018-01-28 DIAGNOSIS — E119 Type 2 diabetes mellitus without complications: Secondary | ICD-10-CM | POA: Diagnosis not present

## 2018-01-28 DIAGNOSIS — J45909 Unspecified asthma, uncomplicated: Secondary | ICD-10-CM | POA: Insufficient documentation

## 2018-01-28 DIAGNOSIS — R51 Headache: Secondary | ICD-10-CM | POA: Diagnosis present

## 2018-01-28 DIAGNOSIS — M79604 Pain in right leg: Secondary | ICD-10-CM | POA: Insufficient documentation

## 2018-01-28 DIAGNOSIS — M545 Low back pain: Secondary | ICD-10-CM | POA: Insufficient documentation

## 2018-01-28 DIAGNOSIS — M7918 Myalgia, other site: Secondary | ICD-10-CM | POA: Diagnosis not present

## 2018-01-28 DIAGNOSIS — Z79899 Other long term (current) drug therapy: Secondary | ICD-10-CM | POA: Insufficient documentation

## 2018-01-28 HISTORY — DX: Type 2 diabetes mellitus without complications: E11.9

## 2018-01-28 MED ORDER — CYCLOBENZAPRINE HCL 10 MG PO TABS: 10.0000 mg | ORAL_TABLET | Freq: Three times a day (TID) | ORAL | 0 refills | Status: DC | PRN

## 2018-01-28 MED ORDER — CYCLOBENZAPRINE HCL 10 MG PO TABS
10.0000 mg | ORAL_TABLET | Freq: Once | ORAL | Status: AC
Start: 2018-01-28 — End: 2018-01-28
  Administered 2018-01-28: 10 mg via ORAL
  Filled 2018-01-28: qty 1

## 2018-01-28 MED ORDER — IBUPROFEN 800 MG PO TABS: 800.0000 mg | ORAL_TABLET | Freq: Three times a day (TID) | ORAL | 0 refills | Status: DC | PRN

## 2018-01-28 MED ORDER — OXYCODONE-ACETAMINOPHEN 7.5-325 MG PO TABS: 1.0000 | ORAL_TABLET | Freq: Four times a day (QID) | ORAL | 0 refills | Status: DC | PRN

## 2018-01-28 MED ORDER — IBUPROFEN 800 MG PO TABS
800.0000 mg | ORAL_TABLET | Freq: Once | ORAL | Status: AC
  Administered 2018-01-28: 800 mg via ORAL
  Filled 2018-01-28: qty 1

## 2018-01-28 MED ORDER — OXYCODONE-ACETAMINOPHEN 5-325 MG PO TABS
1.0000 | ORAL_TABLET | Freq: Once | ORAL | Status: AC
  Administered 2018-01-28: 1 via ORAL
  Filled 2018-01-28: qty 1

## 2018-01-28 NOTE — ED Notes (Signed)
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
## 2018-04-05 ENCOUNTER — Ambulatory Visit
Admission: EM | Admit: 2018-04-05 | Discharge: 2018-04-05 | Disposition: A | Discharge status: Home / Self Care | Payer: Medicaid Other | Attending: Family Medicine | Admitting: Family Medicine

## 2018-04-05 ENCOUNTER — Other Ambulatory Visit: Payer: Self-pay

## 2018-04-05 ENCOUNTER — Encounter: Payer: Self-pay | Admitting: Emergency Medicine

## 2018-04-05 DIAGNOSIS — J0110 Acute frontal sinusitis, unspecified: Secondary | ICD-10-CM | POA: Diagnosis not present

## 2018-04-05 DIAGNOSIS — J029 Acute pharyngitis, unspecified: Secondary | ICD-10-CM | POA: Diagnosis present

## 2018-04-05 DIAGNOSIS — J45901 Unspecified asthma with (acute) exacerbation: Secondary | ICD-10-CM | POA: Diagnosis not present

## 2018-04-05 DIAGNOSIS — J0100 Acute maxillary sinusitis, unspecified: Secondary | ICD-10-CM | POA: Diagnosis not present

## 2018-04-05 DIAGNOSIS — Z79899 Other long term (current) drug therapy: Secondary | ICD-10-CM | POA: Diagnosis not present

## 2018-04-05 DIAGNOSIS — R05 Cough: Secondary | ICD-10-CM | POA: Diagnosis present

## 2018-04-05 DIAGNOSIS — Z87891 Personal history of nicotine dependence: Secondary | ICD-10-CM | POA: Diagnosis not present

## 2018-04-05 DIAGNOSIS — Z880 Allergy status to penicillin: Secondary | ICD-10-CM | POA: Diagnosis not present

## 2018-04-05 DIAGNOSIS — R0981 Nasal congestion: Secondary | ICD-10-CM | POA: Diagnosis present

## 2018-04-05 LAB — RAPID STREP SCREEN (MED CTR MEBANE ONLY): STREPTOCOCCUS, GROUP A SCREEN (DIRECT): NEGATIVE

## 2018-04-05 MED ORDER — DOXYCYCLINE HYCLATE 100 MG PO CAPS: 100.0000 mg | ORAL_CAPSULE | Freq: Two times a day (BID) | ORAL | 0 refills | Status: DC

## 2018-04-05 MED ORDER — ALBUTEROL SULFATE HFA 108 (90 BASE) MCG/ACT IN AERS: 2.0000 | INHALATION_SPRAY | RESPIRATORY_TRACT | 0 refills | Status: DC | PRN

## 2018-04-05 MED ORDER — PREDNISONE 10 MG PO TABS: ORAL_TABLET | ORAL | 0 refills | Status: DC

## 2018-04-05 NOTE — ED Triage Notes (Signed)
Pt c/o sore throat, bilateral ear pain, nasal congestion, nausea, and cough. Denies fever. Started a couple of week ago but has gotten worse.

## 2018-04-05 NOTE — ED Provider Notes (Signed)
[facility] ____________________________________________  Time seen: Approximately 2:14 PM  I have reviewed the triage vital signs and the nursing notes.   HISTORY  Chief Complaint Sore Throat and Otalgia   HPI Palau is a 27 y.o. female presenting for evaluation of 2 weeks of runny nose, nasal congestion and cough.  Patient reports over the last week symptoms have worsened with increased sinus pressure in her forehead as well as increased coughing.  States cough is worse at night and first thing in the morning with associated postnasal drainage.  Has been intermittently using her albuterol inhaler, possibly has heard herself wheeze sometimes at night.  History of asthma.  Denies any recent asthma exacerbations.  No recent prednisone use.  Reports history of allergies and currently still receiving allergy injections, and reports that this is helped dramatically with her asthma as well as her recurrent sinusitis.  States intermittent sore scratchy throat.  No known fevers.  Cough and nasal drainage sometimes dry, sometimes productive of thick mucus.  His continue remain active.  Has used albuterol inhaler, continue with her normal allergy medication, no other medications being taken the same complaints.  Denies other relieving factors.  Denies current chest pain, shortness of breath, or abdominal pain.  Etheleen Nicks, NP: PCP Patient's last menstrual period was 03/11/2018.  Denies current pregnancy.  Past Medical History:  Diagnosis Date  . ADD (attention deficit disorder)   . Anxiety   . Asthma   . Depression   . Diabetes mellitus without complication (HCC)   . GERD (gastroesophageal reflux disease)   . IBS (irritable bowel syndrome)   . Migraine   . MRSA (methicillin resistant staph aureus) culture positive   . Oligomenorrhea   . Pyloric stenosis     There are no active problems to display for this patient.   Past Surgical History:  Procedure  Laterality Date  . COLPOSCOPY    . DILATION AND CURETTAGE OF UTERUS  age 35  . HYSTEROSCOPY  2015  . TONSILLECTOMY    . WISDOM TOOTH EXTRACTION       No current facility-administered medications for this encounter.   Current Outpatient Medications:  .  ADDERALL XR 20 MG 24 hr capsule, Take 1 capsule by mouth 2 (two) times daily., Disp: , Rfl: 0 .  albuterol (PROVENTIL HFA;VENTOLIN HFA) 108 (90 Base) MCG/ACT inhaler, Inhale 2 puffs into the lungs every 4 (four) hours as needed for wheezing or shortness of breath., Disp: 1 Inhaler, Rfl: 0 .  albuterol (PROVENTIL) (2.5 MG/3ML) 0.083% nebulizer solution, Take 3 mLs (2.5 mg total) by nebulization every 6 (six) hours as needed for wheezing or shortness of breath., Disp: 75 mL, Rfl: 0 .  baclofen (LIORESAL) 10 MG tablet, Take 1-2 tablets by mouth at bedtime., Disp: , Rfl: 0 .  clonazePAM (KLONOPIN) 1 MG tablet, Take 1 mg by mouth 3 (three) times daily as needed for anxiety., Disp: , Rfl:  .  cyclobenzaprine (FLEXERIL) 10 MG tablet, Take 1 tablet (10 mg total) by mouth 3 (three) times daily as needed., Disp: 15 tablet, Rfl: 0 .  Erenumab-aooe (AIMOVIG) 70 MG/ML SOAJ, Inject 1 Dose into the skin every 28 (twenty-eight) days., Disp: , Rfl:  .  fluticasone (FLONASE) 50 MCG/ACT nasal spray, Place 2 sprays into both nostrils daily., Disp: 16 g, Rfl: 0 .  Fluticasone-Salmeterol (ADVAIR) 250-50 MCG/DOSE AEPB, Inhale 1 puff into the lungs daily., Disp: , Rfl:  .  gabapentin (NEURONTIN) 300 MG capsule, Take 1 capsule  by mouth 2 (two) times daily., Disp: , Rfl: 0 .  ibuprofen (ADVIL,MOTRIN) 800 MG tablet, Take 1 tablet (800 mg total) by mouth every 8 (eight) hours as needed for moderate pain., Disp: 15 tablet, Rfl: 0 .  rizatriptan (MAXALT) 10 MG tablet, Take 1 tablet by mouth every 2 (two) hours as needed., Disp: , Rfl: 0 .  albuterol (PROVENTIL HFA;VENTOLIN HFA) 108 (90 Base) MCG/ACT inhaler, Inhale 2 puffs into the lungs every 4 (four) hours as needed.,  Disp: 1 Inhaler, Rfl: 0 .  doxycycline (VIBRAMYCIN) 100 MG capsule, Take 1 capsule (100 mg total) by mouth 2 (two) times daily., Disp: 20 capsule, Rfl: 0 .  predniSONE (DELTASONE) 10 MG tablet, Start 60 mg po day one, then 50 mg po day two, taper by 10 mg daily until complete., Disp: 21 tablet, Rfl: 0 .  Spacer/Aero-Holding Chambers (AEROCHAMBER PLUS) inhaler, Use as instructed, Disp: 1 each, Rfl: 2  Allergies Cefprozil; Amoxil [amoxicillin]; and Augmentin [amoxicillin-pot clavulanate]  Family History  Problem Relation Age of Onset  . Ovarian cancer Mother 37  . Other Father     Social History Social History   Tobacco Use  . Smoking status: Former Games developer  . Smokeless tobacco: Never Used  Substance Use Topics  . Alcohol use: Yes    Alcohol/week: 0.0 standard drinks    Comment: rarely  . Drug use: No    Review of Systems Constitutional: No fever ENT: As above.  Cardiovascular: Denies chest pain. Respiratory: Denies shortness of breath. Some intermittent wheezing.  Gastrointestinal: No abdominal pain.   Skin: Negative for rash.   ____________________________________________   PHYSICAL EXAM:  VITAL SIGNS: ED Triage Vitals  Enc Vitals Group     BP 04/05/18 1311 129/72     Pulse Rate 04/05/18 1311 75     Resp 04/05/18 1311 18     Temp 04/05/18 1311 98.2 F (36.8 C)     Temp Source 04/05/18 1311 Oral     SpO2 04/05/18 1311 98 %     Weight 04/05/18 1309 300 lb (136.1 kg)     Height 04/05/18 1309 5\' 8"  (1.727 m)     Head Circumference --      Peak Flow --      Pain Score 04/05/18 1309 7     Pain Loc --      Pain Edu? --      Excl. in GC? --     Constitutional: Alert and oriented. Well appearing and in no acute distress. Eyes: Conjunctivae are normal.  Head: Atraumatic.Mild to moderate tenderness to palpation bilateral frontal and maxillary sinuses. No swelling. No erythema.   Ears: no erythema, normal TMs bilaterally.   Nose: nasal congestion with bilateral  nasal turbinate erythema and edema.   Mouth/Throat: Mucous membranes are moist.  Oropharynx non-erythematous.No tonsillar swelling or exudate.  Neck: No stridor.  No cervical spine tenderness to palpation. Hematological/Lymphatic/Immunilogical: No cervical lymphadenopathy. Cardiovascular: Normal rate, regular rhythm. Grossly normal heart sounds.  Good peripheral circulation. Respiratory: Normal respiratory effort.  No retractions.  Mild scattered inspiratory and expiratory wheezes.  No rhonchi.  No focal area of consolidation auscultated.  Speaks in complete sentences.  Good air movement.  Gastrointestinal: Obese abdomen. Musculoskeletal: Steady gait.   Neurologic:  Normal speech and language. No gait instability. Skin:  Skin is warm, dry and intact. No rash noted. Psychiatric: Mood and affect are normal. Speech and behavior are normal.  ___________________________________________   LABS (all labs ordered are listed, but only  abnormal results are displayed)  Labs Reviewed  RAPID STREP SCREEN ([facility] ONLY)  CULTURE, GROUP A STREP [facility])    PROCEDURES Procedures    INITIAL IMPRESSION / ASSESSMENT AND PLAN / ED COURSE  Pertinent labs & imaging results that were available during my care of the patient were reviewed by me and considered in my medical decision making (see chart for details).  Living patient.  No acute distress.  Suspect recent viral upper respiratory infection versus seasonal allergies with secondary asthma exacerbation and sinusitis.  Will treat with oral doxycycline, prednisone taper, refill of albuterol inhaler given.  Continue allergy regimen and over-the-counter Mucinex as needed.  Encourage rest, fluids, supportive care and discuss strict follow-up and return parameters. Discussed indication, risks and benefits of medications with patient.  Discussed follow up with Primary care physician this week. Discussed follow up and return parameters including no  resolution or any worsening concerns. Patient verbalized understanding and agreed to plan.   ____________________________________________   FINAL CLINICAL IMPRESSION(S) / ED DIAGNOSES  Final diagnoses:  Acute frontal sinusitis, recurrence not specified  Acute maxillary sinusitis, recurrence not specified  Asthma with acute exacerbation, unspecified asthma severity, unspecified whether persistent     ED Discharge Orders         Ordered    predniSONE (DELTASONE) 10 MG tablet     04/05/18 1414    doxycycline (VIBRAMYCIN) 100 MG capsule  2 times daily     04/05/18 1414    albuterol (PROVENTIL HFA;VENTOLIN HFA) 108 (90 Base) MCG/ACT inhaler  Every 4 hours PRN     04/05/18 1414           Note: This dictation was prepared with Dragon dictation along with smaller phrase technology. Any transcriptional errors that result from this process are unintentional.         Renford Dills, NP 04/05/18 1423

## 2018-04-05 NOTE — Discharge Instructions (Addendum)
Take medication as prescribed. Rest. Drink plenty of fluids.  ° °Follow up with your primary care physician this week as needed. Return to Urgent care for new or worsening concerns.  ° °

## 2018-04-08 LAB — CULTURE, GROUP A STREP (THRC)

## 2018-06-15 ENCOUNTER — Other Ambulatory Visit: Payer: Self-pay

## 2018-06-15 ENCOUNTER — Encounter: Payer: Self-pay | Admitting: Emergency Medicine

## 2018-06-15 ENCOUNTER — Ambulatory Visit
Admission: EM | Admit: 2018-06-15 | Discharge: 2018-06-15 | Disposition: A | Discharge status: Home / Self Care | Payer: Medicaid Other | Attending: Emergency Medicine | Admitting: Emergency Medicine

## 2018-06-15 DIAGNOSIS — Z113 Encounter for screening for infections with a predominantly sexual mode of transmission: Secondary | ICD-10-CM | POA: Insufficient documentation

## 2018-06-15 DIAGNOSIS — N760 Acute vaginitis: Secondary | ICD-10-CM | POA: Diagnosis present

## 2018-06-15 DIAGNOSIS — R300 Dysuria: Secondary | ICD-10-CM | POA: Insufficient documentation

## 2018-06-15 DIAGNOSIS — B9689 Other specified bacterial agents as the cause of diseases classified elsewhere: Secondary | ICD-10-CM | POA: Diagnosis not present

## 2018-06-15 LAB — URINALYSIS, COMPLETE (UACMP) WITH MICROSCOPIC
Bilirubin Urine: NEGATIVE
GLUCOSE, UA: 500 mg/dL — AB
Ketones, ur: NEGATIVE mg/dL
Nitrite: NEGATIVE
PROTEIN: NEGATIVE mg/dL
Specific Gravity, Urine: 1.02 (ref 1.005–1.030)
pH: 5.5 (ref 5.0–8.0)

## 2018-06-15 LAB — WET PREP, GENITAL
Sperm: NONE SEEN
TRICH WET PREP: NONE SEEN
YEAST WET PREP: NONE SEEN

## 2018-06-15 LAB — CHLAMYDIA/NGC RT PCR (ARMC ONLY)
CHLAMYDIA TR: NOT DETECTED
N GONORRHOEAE: NOT DETECTED

## 2018-06-15 LAB — HCG, QUANTITATIVE, PREGNANCY: hCG, Beta Chain, Quant, S: 1 m[IU]/mL (ref <5)

## 2018-06-15 LAB — PREGNANCY, URINE: Preg Test, Ur: NEGATIVE

## 2018-06-15 MED ORDER — METRONIDAZOLE 500 MG PO TABS: 500.0000 mg | ORAL_TABLET | Freq: Two times a day (BID) | ORAL | 0 refills | Status: DC

## 2018-06-15 MED ORDER — NITROFURANTOIN MONOHYD MACRO 100 MG PO CAPS: 100.0000 mg | ORAL_CAPSULE | Freq: Two times a day (BID) | ORAL | 0 refills | Status: DC

## 2018-06-15 NOTE — Discharge Instructions (Signed)
Take medication as prescribed. Rest. Drink plenty of fluids.  ° °Follow up with your primary care physician. Return to Urgent care for new or worsening concerns.  ° °

## 2018-06-15 NOTE — ED Provider Notes (Signed)
[facility] ____________________________________________  Time seen: Approximately 3:04 PM  I have reviewed the triage vital signs and the nursing notes.   HISTORY  Chief Complaint Dysuria and Vaginal Itching   HPI Yvonne Rios is a 28 y.o. female present with significant other at bedside for evaluation of 2 days of urinary frequency, urinary urgency and some vaginal discomfort.  States whitish vaginal discharge with itching and vaginal area feels raw.  Denies any open sores or lesions.  Denies abdominal pain.  States has had some aching low back pain.  Denies chest pain, shortness of breath, fevers, vomiting or diarrhea.  Patient reports vaginal does feel similar to previous yeast infections or bacterial vaginal infections.  No alleviating measures attempted.  Denies aggravating factors.  Does report recent change in sexual partners 2 weeks ago and would like to have STD testing performed.  Also states her last period was at the end of October and request testing for pregnancy.  Has not taking a pregnancy test in the last few weeks at home.  States that she typically has a regular menstrual cycle every month, but again states has not had a period since the end of October.  Reports otherwise doing well.  Etheleen Nicks, NP: PCP Patient's last menstrual period was 04/03/2018 (approximate).   Past Medical History:  Diagnosis Date  . ADD (attention deficit disorder)   . Anxiety   . Asthma   . Depression   . Diabetes mellitus without complication (HCC)   . GERD (gastroesophageal reflux disease)   . IBS (irritable bowel syndrome)   . Migraine   . MRSA (methicillin resistant staph aureus) culture positive   . Oligomenorrhea   . Pyloric stenosis     There are no active problems to display for this patient.   Past Surgical History:  Procedure Laterality Date  . COLPOSCOPY    . DILATION AND CURETTAGE OF UTERUS  age 61  . HYSTEROSCOPY  2015  . TONSILLECTOMY      . WISDOM TOOTH EXTRACTION       No current facility-administered medications for this encounter.   Current Outpatient Medications:  .  ADDERALL XR 20 MG 24 hr capsule, Take 1 capsule by mouth 2 (two) times daily., Disp: , Rfl: 0 .  albuterol (PROVENTIL HFA;VENTOLIN HFA) 108 (90 Base) MCG/ACT inhaler, Inhale 2 puffs into the lungs every 4 (four) hours as needed for wheezing or shortness of breath., Disp: 1 Inhaler, Rfl: 0 .  albuterol (PROVENTIL HFA;VENTOLIN HFA) 108 (90 Base) MCG/ACT inhaler, Inhale 2 puffs into the lungs every 4 (four) hours as needed., Disp: 1 Inhaler, Rfl: 0 .  albuterol (PROVENTIL) (2.5 MG/3ML) 0.083% nebulizer solution, Take 3 mLs (2.5 mg total) by nebulization every 6 (six) hours as needed for wheezing or shortness of breath., Disp: 75 mL, Rfl: 0 .  clonazePAM (KLONOPIN) 1 MG tablet, Take 1 mg by mouth 3 (three) times daily as needed for anxiety., Disp: , Rfl:  .  cyclobenzaprine (FLEXERIL) 10 MG tablet, Take 1 tablet (10 mg total) by mouth 3 (three) times daily as needed., Disp: 15 tablet, Rfl: 0 .  Erenumab-aooe (AIMOVIG) 70 MG/ML SOAJ, Inject 1 Dose into the skin every 28 (twenty-eight) days., Disp: , Rfl:  .  fluticasone (FLONASE) 50 MCG/ACT nasal spray, Place 2 sprays into both nostrils daily., Disp: 16 g, Rfl: 0 .  Fluticasone-Salmeterol (ADVAIR) 250-50 MCG/DOSE AEPB, Inhale 1 puff into the lungs daily., Disp: , Rfl:  .  gabapentin (NEURONTIN) 300 MG  capsule, Take 1 capsule by mouth 2 (two) times daily., Disp: , Rfl: 0 .  ibuprofen (ADVIL,MOTRIN) 800 MG tablet, Take 1 tablet (800 mg total) by mouth every 8 (eight) hours as needed for moderate pain., Disp: 15 tablet, Rfl: 0 .  lurasidone (LATUDA) 40 MG TABS tablet, Take 40 mg by mouth daily with breakfast., Disp: , Rfl:  .  Spacer/Aero-Holding Chambers (AEROCHAMBER PLUS) inhaler, Use as instructed, Disp: 1 each, Rfl: 2 .  metroNIDAZOLE (FLAGYL) 500 MG tablet, Take 1 tablet (500 mg total) by mouth 2 (two) times  daily., Disp: 14 tablet, Rfl: 0 .  nitrofurantoin, macrocrystal-monohydrate, (MACROBID) 100 MG capsule, Take 1 capsule (100 mg total) by mouth 2 (two) times daily., Disp: 10 capsule, Rfl: 0  Allergies Cefprozil; Amoxil [amoxicillin]; and Augmentin [amoxicillin-pot clavulanate]  Family History  Problem Relation Age of Onset  . Ovarian cancer Mother 3530  . Other Father     Social History Social History   Tobacco Use  . Smoking status: Former Games developermoker  . Smokeless tobacco: Never Used  Substance Use Topics  . Alcohol use: Not Currently    Alcohol/week: 0.0 standard drinks    Comment: rarely  . Drug use: No    Review of Systems Constitutional: No fever Cardiovascular: Denies chest pain. Respiratory: Denies shortness of breath. Gastrointestinal: No abdominal pain.  No nausea, no vomiting.  No diarrhea.  No constipation. Genitourinary: Positive for dysuria.  Positive vaginal discharge.  Denies vaginal bleeding or spotting. Musculoskeletal: Positive for back pain.  Denies injury Skin: Negative for rash.   ____________________________________________   PHYSICAL EXAM:  VITAL SIGNS: ED Triage Vitals  Enc Vitals Group     BP 06/15/18 1315 117/68     Pulse Rate 06/15/18 1315 60     Resp 06/15/18 1315 18     Temp 06/15/18 1315 98.7 F (37.1 C)     Temp Source 06/15/18 1315 Oral     SpO2 06/15/18 1315 99 %     Weight 06/15/18 1310 300 lb (136.1 kg)     Height 06/15/18 1310 5\' 8"  (1.727 m)     Head Circumference --      Peak Flow --      Pain Score 06/15/18 1310 0     Pain Loc --      Pain Edu? --      Excl. in GC? --     Constitutional: Alert and oriented. Well appearing and in no acute distress. ENT      Head: Normocephalic and atraumatic. Cardiovascular: Normal rate, regular rhythm. Grossly normal heart sounds.  Good peripheral circulation. Respiratory: Normal respiratory effort without tachypnea nor retractions. Breath sounds are clear and equal bilaterally. No  wheezes, rales, rhonchi. Gastrointestinal: Soft and nontender.  Obese abdomen.  No CVA tenderness. Musculoskeletal: Mild lower diffuse lumbar tenderness palpation, no point bony tenderness, no cervical or thoracic tenderness palpation. Neurologic:  Normal speech and language.  Speech is normal. No gait instability.  Skin:  Skin is warm, dry and intact. No rash noted. Psychiatric: Mood and affect are normal. Speech and behavior are normal. Patient exhibits appropriate insight and judgment   ___________________________________________   LABS (all labs ordered are listed, but only abnormal results are displayed)  Labs Reviewed  WET PREP, GENITAL - Abnormal; Notable for the following components:      Result Value   Clue Cells Wet Prep HPF POC PRESENT (*)    WBC, Wet Prep HPF POC MODERATE (*)    All other components  within normal limits  URINALYSIS, COMPLETE (UACMP) WITH MICROSCOPIC - Abnormal; Notable for the following components:   Glucose, UA 500 (*)    Hgb urine dipstick TRACE (*)    Leukocytes, UA SMALL (*)    Bacteria, UA FEW (*)    All other components within normal limits  URINE CULTURE  CHLAMYDIA/NGC RT PCR ([facility] ONLY)  PREGNANCY, URINE  HCG, QUANTITATIVE, PREGNANCY  HIV ANTIBODY (ROUTINE TESTING W REFLEX)  RPR  HEPATITIS PANEL, ACUTE  HSV(HERPES SIMPLEX VRS) I + II AB-IGG  HSV(HERPES SIMPLEX VRS) I + II AB-IGM     PROCEDURES Procedures    INITIAL IMPRESSION / ASSESSMENT AND PLAN / ED COURSE  Pertinent labs & imaging results that were available during my care of the patient were reviewed by me and considered in my medical decision making (see chart for details).  Well-appearing patient.  No acute distress.  Urinalysis reviewed, concern for possible UTI, will culture urine.  Patient elected for self wet prep and self gonorrhea and chlamydia.  Wet prep positive for clue cells.  Urine pregnancy test negative, however patient reports regular menstrual and has  not had since October, will evaluate serum hCG.  Serum hCG negative.  Patient also requested for other STD testing including HIV, RPR, hepatitis and herpes.,  Test ordered and sent.  We will culture the urine and empirically start on oral Macrobid.  Will treat with Flagyl for bacterial vaginosis.  Encouraged pelvic rest, fluids.  Encourage patient to follow-up closely with her primary care OB/GYN regarding missed menstrual and current complaints.Discussed indication, risks and benefits of medications with patient.  Discussed follow up and return parameters including no resolution or any worsening concerns. Patient verbalized understanding and agreed to plan.   ____________________________________________   FINAL CLINICAL IMPRESSION(S) / ED DIAGNOSES  Final diagnoses:  Dysuria  BV (bacterial vaginosis)  Screen for STD (sexually transmitted disease)     ED Discharge Orders         Ordered    nitrofurantoin, macrocrystal-monohydrate, (MACROBID) 100 MG capsule  2 times daily     06/15/18 1517    metroNIDAZOLE (FLAGYL) 500 MG tablet  2 times daily     06/15/18 1517           Note: This dictation was prepared with Dragon dictation along with smaller phrase technology. Any transcriptional errors that result from this process are unintentional.         Renford Dills, NP 06/15/18 1610

## 2018-06-15 NOTE — ED Triage Notes (Addendum)
Patient c/o dysuria and having vaginal itching that started 2 days ago. Patient also states her last period was the last week of October. Patient states there is a chance she could be pregnant, has not done a pregnancy test in over 1 month.

## 2018-06-16 LAB — HEPATITIS PANEL, ACUTE
HEP B C IGM: NEGATIVE
Hep A IgM: NEGATIVE
Hepatitis B Surface Ag: NEGATIVE

## 2018-06-16 LAB — RPR: RPR Ser Ql: NONREACTIVE

## 2018-06-16 LAB — HSV(HERPES SIMPLEX VRS) I + II AB-IGM: HSVI/II Comb IgM: <0.91 Ratio (ref 0.00–0.90)

## 2018-06-16 LAB — HSV(HERPES SIMPLEX VRS) I + II AB-IGG: HSV 1 GLYCOPROTEIN G AB, IGG: 15.4 {index} — AB (ref 0.00–0.90)

## 2018-06-16 LAB — URINE CULTURE: Culture: NO GROWTH

## 2018-06-16 LAB — HIV ANTIBODY (ROUTINE TESTING W REFLEX): HIV Screen 4th Generation wRfx: NONREACTIVE

## 2018-06-18 ENCOUNTER — Telehealth (HOSPITAL_COMMUNITY): Payer: Self-pay | Admitting: Emergency Medicine

## 2018-06-18 NOTE — Telephone Encounter (Signed)
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   Attempted to reach patient. No answer at this time. Voicemail left.

## 2018-06-19 ENCOUNTER — Telehealth (HOSPITAL_COMMUNITY): Payer: Self-pay | Admitting: Emergency Medicine

## 2018-06-19 NOTE — Telephone Encounter (Signed)
Attempted call x 2 no answer and voice mail full

## 2018-06-21 ENCOUNTER — Telehealth (HOSPITAL_COMMUNITY): Payer: Self-pay | Admitting: Emergency Medicine

## 2018-06-21 NOTE — Telephone Encounter (Signed)
Spoke with pt given results; pt verbalized understanding and safe sex practices gone over

## 2018-06-30 ENCOUNTER — Encounter (HOSPITAL_COMMUNITY): Payer: Self-pay | Admitting: Emergency Medicine

## 2018-06-30 ENCOUNTER — Other Ambulatory Visit: Payer: Self-pay

## 2018-06-30 ENCOUNTER — Emergency Department (HOSPITAL_COMMUNITY)
Admission: EM | Admit: 2018-06-30 | Discharge: 2018-07-01 | Disposition: A | Discharge status: Home / Self Care | Payer: Medicaid Other | Attending: Emergency Medicine | Admitting: Emergency Medicine

## 2018-06-30 DIAGNOSIS — M545 Low back pain, unspecified: Secondary | ICD-10-CM

## 2018-06-30 DIAGNOSIS — Z87891 Personal history of nicotine dependence: Secondary | ICD-10-CM | POA: Insufficient documentation

## 2018-06-30 DIAGNOSIS — Z79899 Other long term (current) drug therapy: Secondary | ICD-10-CM | POA: Insufficient documentation

## 2018-06-30 DIAGNOSIS — R110 Nausea: Secondary | ICD-10-CM | POA: Diagnosis not present

## 2018-06-30 DIAGNOSIS — R109 Unspecified abdominal pain: Secondary | ICD-10-CM | POA: Diagnosis present

## 2018-06-30 DIAGNOSIS — J45909 Unspecified asthma, uncomplicated: Secondary | ICD-10-CM | POA: Diagnosis not present

## 2018-06-30 DIAGNOSIS — E119 Type 2 diabetes mellitus without complications: Secondary | ICD-10-CM | POA: Insufficient documentation

## 2018-06-30 HISTORY — DX: Urinary tract infection, site not specified: N39.0

## 2018-06-30 LAB — URINALYSIS, ROUTINE W REFLEX MICROSCOPIC
Bilirubin Urine: NEGATIVE
Glucose, UA: >=500 mg/dL — AB
Ketones, ur: NEGATIVE mg/dL
Nitrite: NEGATIVE
PH: 6 (ref 5.0–8.0)
Protein, ur: NEGATIVE mg/dL
SPECIFIC GRAVITY, URINE: 1.007 (ref 1.005–1.030)

## 2018-06-30 LAB — I-STAT BETA HCG BLOOD, ED (MC, WL, AP ONLY)

## 2018-06-30 NOTE — ED Triage Notes (Signed)
C/o sharp L flank pain and nausea since 8pm.  Denies dysuria.  States she was treated for UTI 1 week ago.  Completed antibiotics.

## 2018-07-01 LAB — BASIC METABOLIC PANEL
Anion gap: 12 (ref 5–15)
BUN: 10 mg/dL (ref 6–20)
CHLORIDE: 101 mmol/L (ref 98–111)
CO2: 24 mmol/L (ref 22–32)
Calcium: 8.9 mg/dL (ref 8.9–10.3)
Creatinine, Ser: 0.76 mg/dL (ref 0.44–1.00)
GFR calc Af Amer: >60 mL/min (ref >60)
GFR calc non Af Amer: >60 mL/min (ref >60)
GLUCOSE: 192 mg/dL — AB (ref 70–99)
Potassium: 3.7 mmol/L (ref 3.5–5.1)
SODIUM: 137 mmol/L (ref 135–145)

## 2018-07-01 MED ORDER — IBUPROFEN 400 MG PO TABS
600.0000 mg | ORAL_TABLET | Freq: Once | ORAL | Status: AC
  Administered 2018-07-01: 600 mg via ORAL
  Filled 2018-07-01: qty 1

## 2018-07-01 MED ORDER — IBUPROFEN 800 MG PO TABS: 800.0000 mg | ORAL_TABLET | Freq: Three times a day (TID) | ORAL | 0 refills | Status: DC | PRN

## 2018-07-01 MED ORDER — CYCLOBENZAPRINE HCL 10 MG PO TABS
10.0000 mg | ORAL_TABLET | Freq: Once | ORAL | Status: AC
  Administered 2018-07-01: 10 mg via ORAL
  Filled 2018-07-01: qty 1

## 2018-07-01 MED ORDER — CYCLOBENZAPRINE HCL 10 MG PO TABS: 10.0000 mg | ORAL_TABLET | Freq: Three times a day (TID) | ORAL | 0 refills | Status: DC | PRN

## 2018-07-01 NOTE — ED Provider Notes (Signed)
[facility] EMERGENCY DEPARTMENT Provider Note   CSN: [id number] Arrival date & time: 06/30/18  2037     History   Chief Complaint Chief Complaint  Patient presents with  . Flank Pain    HPI Yvonne Rios is a 28 y.o. female.  Patient to ED with complain of bilateral flank pain after eating dinner tonight associated with nausea. No vomiting, fever. No modifying factors. She did not take anything for pain prior to coming to the ED. She was recently treated for a urinary tract infection but states her symptoms associated with that resolved after antibiotic treatment. No heavy lifting or back injury today. Pain has been constant and nonradiating since onset.   The history is provided by the patient. No language interpreter was used.  Flank Pain  Pertinent negatives include no abdominal pain.    Past Medical History:  Diagnosis Date  . ADD (attention deficit disorder)   . Anxiety   . Asthma   . Depression   . Diabetes mellitus without complication (HCC)   . GERD (gastroesophageal reflux disease)   . IBS (irritable bowel syndrome)   . Migraine   . MRSA (methicillin resistant staph aureus) culture positive   . Oligomenorrhea   . Pyloric stenosis   . UTI (urinary tract infection)     There are no active problems to display for this patient.   Past Surgical History:  Procedure Laterality Date  . COLPOSCOPY    . DILATION AND CURETTAGE OF UTERUS  age 23  . HYSTEROSCOPY  2015  . TONSILLECTOMY    . WISDOM TOOTH EXTRACTION       OB History    Gravida  3   Para  1   Term  1   Preterm      AB  2   Living  1     SAB  2   TAB      Ectopic      Multiple      Live Births  1            Home Medications    Prior to Admission medications   Medication Sig Start Date End Date Taking? Authorizing Provider  ADDERALL XR 20 MG 24 hr capsule Take 1 capsule by mouth 2 (two) times daily. 03/05/15   [provider]  albuterol  (PROVENTIL HFA;VENTOLIN HFA) 108 (90 Base) MCG/ACT inhaler Inhale 2 puffs into the lungs every 4 (four) hours as needed for wheezing or shortness of breath. 05/01/17   Domenick Gong, MD  albuterol (PROVENTIL HFA;VENTOLIN HFA) 108 (90 Base) MCG/ACT inhaler Inhale 2 puffs into the lungs every 4 (four) hours as needed. 04/05/18   Renford Dills, NP  albuterol (PROVENTIL) (2.5 MG/3ML) 0.083% nebulizer solution Take 3 mLs (2.5 mg total) by nebulization every 6 (six) hours as needed for wheezing or shortness of breath. 10/04/16   Renford Dills, NP  clonazePAM (KLONOPIN) 1 MG tablet Take 1 mg by mouth 3 (three) times daily as needed for anxiety.    [provider]  cyclobenzaprine (FLEXERIL) 10 MG tablet Take 1 tablet (10 mg total) by mouth 3 (three) times daily as needed. 01/28/18   Joni Reining, PA-C  Erenumab-aooe (AIMOVIG) 70 MG/ML SOAJ Inject 1 Dose into the skin every 28 (twenty-eight) days.    [provider]  fluticasone (FLONASE) 50 MCG/ACT nasal spray Place 2 sprays into both nostrils daily. 05/01/17   Domenick Gong, MD  Fluticasone-Salmeterol (ADVAIR) 250-50 MCG/DOSE AEPB Inhale  1 puff into the lungs daily.    [provider]  gabapentin (NEURONTIN) 300 MG capsule Take 1 capsule by mouth 2 (two) times daily. 03/13/15   [provider]  ibuprofen (ADVIL,MOTRIN) 800 MG tablet Take 1 tablet (800 mg total) by mouth every 8 (eight) hours as needed for moderate pain. 01/28/18   Joni ReiningSmith, Ronald K, PA-C  lurasidone (LATUDA) 40 MG TABS tablet Take 40 mg by mouth daily with breakfast.    [provider]  metroNIDAZOLE (FLAGYL) 500 MG tablet Take 1 tablet (500 mg total) by mouth 2 (two) times daily. 06/15/18   Renford DillsMiller, Lindsey, NP  nitrofurantoin, macrocrystal-monohydrate, (MACROBID) 100 MG capsule Take 1 capsule (100 mg total) by mouth 2 (two) times daily. 06/15/18   Renford DillsMiller, Lindsey, NP  Spacer/Aero-Holding Deretha Emoryhambers (AEROCHAMBER PLUS) inhaler Use as instructed  05/01/17   Domenick GongMortenson, Ashley, MD    Family History Family History  Problem Relation Age of Onset  . Ovarian cancer Mother 6530  . Other Father     Social History Social History   Tobacco Use  . Smoking status: Former Games developermoker  . Smokeless tobacco: Never Used  Substance Use Topics  . Alcohol use: Not Currently    Alcohol/week: 0.0 standard drinks    Comment: rarely  . Drug use: No     Allergies   Cefprozil; Amoxil [amoxicillin]; and Augmentin [amoxicillin-pot clavulanate]   Review of Systems Review of Systems  Constitutional: Negative for chills and fever.  Gastrointestinal: Positive for nausea. Negative for abdominal pain and vomiting.  Genitourinary: Positive for flank pain. Negative for dysuria, frequency and pelvic pain.  Musculoskeletal: Positive for back pain.  Skin: Negative.   Neurological: Negative.  Negative for weakness and numbness.     Physical Exam Updated Vital Signs BP 123/70 (BP Location: Left Arm)   Pulse 69   Temp 98 F (36.7 C) (Oral)   Resp 20   LMP 05/07/2018   SpO2 98%   Physical Exam Vitals signs and nursing note reviewed.  Constitutional:      Appearance: She is well-developed.  Neck:     Musculoskeletal: Normal range of motion.  Pulmonary:     Effort: Pulmonary effort is normal.  Abdominal:     Tenderness: There is no abdominal tenderness.  Musculoskeletal:       Back:     Comments: Bilateral lower back tenderness. No discernable CVA tenderness.   Skin:    General: Skin is warm and dry.  Neurological:     Mental Status: She is alert and oriented to person, place, and time.      ED Treatments / Results  Labs (all labs ordered are listed, but only abnormal results are displayed) Labs Reviewed  URINALYSIS, ROUTINE W REFLEX MICROSCOPIC - Abnormal; Notable for the following components:      Result Value   APPearance HAZY (*)    Glucose, UA >=500 (*)    Hgb urine dipstick SMALL (*)    Leukocytes, UA LARGE (*)    Bacteria,  UA RARE (*)    All other components within normal limits  I-STAT BETA HCG BLOOD, ED ([facility], [facility], [facility] ONLY)    EKG None  Radiology No results found.  Procedures Procedures (including critical care time)  Medications Ordered in ED Medications - No data to display   Initial Impression / Assessment and Plan / ED Course  I have reviewed the triage vital signs and the nursing notes.  Pertinent labs & imaging results that were available during  my care of the patient were reviewed by me and considered in my medical decision making (see chart for details).     Patient to ED with concern for kidney pain, onset tonight after eating dinner.   UA negative for any infection or blood (WBC 21-50 with 6-10 squamous, Rare bacteria, nitrite negative). Pain is felt to be musculoskeletal rather than flank with no CVA tenderness. VSS, afebrile.   She was concerned that her kidney functions were elevated as they have been on 2 occasions with her PCP. Tonight renal functions are normal.   She can be discharged home with treatment for suspected musculoskeletal pain. Recommend close PCP follow up.   Final Clinical Impressions(s) / ED Diagnoses   Final diagnoses:  None   1. Low back pain   ED Discharge Orders    None       Danne Harbor 07/01/18 [id number]    Glynn Octave, MD 07/01/18 [phone]

## 2018-07-01 NOTE — Discharge Instructions (Addendum)
Your tests are essentially normal with nothing to suggest infection or kidney problem. The pain is likely musculoskeletal and can be treated with the prescribed medications. Follow up with your doctor if symptoms persist.

## 2018-07-21 ENCOUNTER — Encounter: Payer: Self-pay | Admitting: Emergency Medicine

## 2018-07-21 ENCOUNTER — Ambulatory Visit
Admission: EM | Admit: 2018-07-21 | Discharge: 2018-07-21 | Disposition: A | Discharge status: Home / Self Care | Payer: Medicaid Other | Attending: Emergency Medicine | Admitting: Emergency Medicine

## 2018-07-21 ENCOUNTER — Ambulatory Visit: Payer: Medicaid Other

## 2018-07-21 ENCOUNTER — Other Ambulatory Visit: Payer: Self-pay

## 2018-07-21 DIAGNOSIS — F329 Major depressive disorder, single episode, unspecified: Secondary | ICD-10-CM | POA: Diagnosis not present

## 2018-07-21 DIAGNOSIS — Z880 Allergy status to penicillin: Secondary | ICD-10-CM | POA: Diagnosis not present

## 2018-07-21 DIAGNOSIS — F419 Anxiety disorder, unspecified: Secondary | ICD-10-CM | POA: Diagnosis not present

## 2018-07-21 DIAGNOSIS — Z7984 Long term (current) use of oral hypoglycemic drugs: Secondary | ICD-10-CM | POA: Insufficient documentation

## 2018-07-21 DIAGNOSIS — Z8041 Family history of malignant neoplasm of ovary: Secondary | ICD-10-CM | POA: Insufficient documentation

## 2018-07-21 DIAGNOSIS — M25562 Pain in left knee: Secondary | ICD-10-CM | POA: Diagnosis not present

## 2018-07-21 DIAGNOSIS — Z791 Long term (current) use of non-steroidal anti-inflammatories (NSAID): Secondary | ICD-10-CM | POA: Diagnosis not present

## 2018-07-21 DIAGNOSIS — J45909 Unspecified asthma, uncomplicated: Secondary | ICD-10-CM | POA: Insufficient documentation

## 2018-07-21 DIAGNOSIS — M545 Low back pain: Secondary | ICD-10-CM | POA: Diagnosis not present

## 2018-07-21 DIAGNOSIS — Z87891 Personal history of nicotine dependence: Secondary | ICD-10-CM | POA: Insufficient documentation

## 2018-07-21 DIAGNOSIS — S39012A Strain of muscle, fascia and tendon of lower back, initial encounter: Secondary | ICD-10-CM

## 2018-07-21 DIAGNOSIS — S8002XA Contusion of left knee, initial encounter: Secondary | ICD-10-CM | POA: Diagnosis not present

## 2018-07-21 DIAGNOSIS — S161XXA Strain of muscle, fascia and tendon at neck level, initial encounter: Secondary | ICD-10-CM

## 2018-07-21 DIAGNOSIS — Z881 Allergy status to other antibiotic agents status: Secondary | ICD-10-CM | POA: Diagnosis not present

## 2018-07-21 DIAGNOSIS — E119 Type 2 diabetes mellitus without complications: Secondary | ICD-10-CM | POA: Insufficient documentation

## 2018-07-21 DIAGNOSIS — Y92410 Unspecified street and highway as the place of occurrence of the external cause: Secondary | ICD-10-CM | POA: Diagnosis not present

## 2018-07-21 DIAGNOSIS — M542 Cervicalgia: Secondary | ICD-10-CM | POA: Diagnosis not present

## 2018-07-21 DIAGNOSIS — Z79899 Other long term (current) drug therapy: Secondary | ICD-10-CM | POA: Insufficient documentation

## 2018-07-21 MED ORDER — ACETAMINOPHEN 500 MG PO TABS
1000.0000 mg | ORAL_TABLET | Freq: Once | ORAL | Status: AC
  Administered 2018-07-21: 1000 mg via ORAL

## 2018-07-21 MED ORDER — IBUPROFEN 600 MG PO TABS: 600.0000 mg | ORAL_TABLET | Freq: Four times a day (QID) | ORAL | 0 refills | Status: DC | PRN

## 2018-07-21 MED ORDER — ORPHENADRINE CITRATE 30 MG/ML IJ SOLN
60.0000 mg | Freq: Once | INTRAMUSCULAR | Status: AC
  Administered 2018-07-21: 60 mg via INTRAMUSCULAR

## 2018-07-21 MED ORDER — KETOROLAC TROMETHAMINE 60 MG/2ML IM SOLN
30.0000 mg | Freq: Once | INTRAMUSCULAR | Status: AC
  Administered 2018-07-21: 30 mg via INTRAMUSCULAR

## 2018-07-21 MED ORDER — TIZANIDINE HCL 4 MG PO TABS: 4.0000 mg | ORAL_TABLET | Freq: Three times a day (TID) | ORAL | 0 refills | Status: DC | PRN
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
## 2018-07-21 NOTE — Discharge Instructions (Addendum)
600 mg of ibuprofen combined with 1 g of Tylenol 3-4 times a day as needed for pain.  Zanaflex will help with the muscle spasms.  Ice your knee for 20 minutes at a time.

## 2019-05-29 ENCOUNTER — Ambulatory Visit: Payer: Medicaid Other | Admitting: Podiatry

## 2019-05-29 ENCOUNTER — Other Ambulatory Visit: Payer: Self-pay

## 2019-05-29 DIAGNOSIS — M79675 Pain in left toe(s): Secondary | ICD-10-CM

## 2019-05-29 DIAGNOSIS — L600 Ingrowing nail: Secondary | ICD-10-CM

## 2019-05-29 NOTE — Patient Instructions (Signed)

## 2019-05-30 ENCOUNTER — Encounter: Payer: Self-pay | Admitting: Podiatry

## 2019-05-30 NOTE — Progress Notes (Signed)
Subjective:  Patient ID: Yvonne Rios, female    DOB: Jan 01, 1991,  MRN: [id number]  Chief Complaint  Patient presents with  . Nail Problem    pt is here for a possible ingrown toenail of the left big toenail lateral side, pt states that it has a history of oozing and pain as well    28 y.o. female presents with the above complaint.  Patient states that her left big toenail lateral side has been causing her a lot of problems.  Patient states been going on for about a week.  She states it is a shooting pain especially when ambulating.  The pain is worse during ambulation.  She states that she has noted some positive swelling as well.  However today it is looking much better but the pain is still there and is causing her a lot of issues.  She denies getting treated for this.  She denies any other acute complaints.   Review of Systems: Negative except as noted in the HPI. Denies N/V/F/Ch.  Past Medical History:  Diagnosis Date  . ADD (attention deficit disorder)   . Anxiety   . Asthma   . Depression   . Diabetes mellitus without complication ([place])   . GERD (gastroesophageal reflux disease)   . IBS (irritable bowel syndrome)   . Migraine   . MRSA (methicillin resistant staph aureus) culture positive   . Oligomenorrhea   . Pyloric stenosis   . UTI (urinary tract infection)     Current Outpatient Medications:  .  ADDERALL XR 20 MG 24 hr capsule, Take 1 capsule by mouth 2 (two) times daily., Disp: , Rfl: 0 .  albuterol (PROVENTIL HFA;VENTOLIN HFA) 108 (90 Base) MCG/ACT inhaler, Inhale 2 puffs into the lungs every 4 (four) hours as needed for wheezing or shortness of breath., Disp: 1 Inhaler, Rfl: 0 .  albuterol (PROVENTIL HFA;VENTOLIN HFA) 108 (90 Base) MCG/ACT inhaler, Inhale 2 puffs into the lungs every 4 (four) hours as needed., Disp: 1 Inhaler, Rfl: 0 .  albuterol (PROVENTIL) (2.5 MG/3ML) 0.083% nebulizer solution, Take 3 mLs (2.5 mg total) by nebulization every 6 (six) hours  as needed for wheezing or shortness of breath., Disp: 75 mL, Rfl: 0 .  butalbital-acetaminophen-caffeine (FIORICET) 50-325-40 MG tablet, Take by mouth., Disp: , Rfl:  .  cariprazine (VRAYLAR) capsule, TAKE ONE CAPSULE BY MOUTH EVERY MORNING FOR 7 DAYS, THEN TAKE TWO CAPSULES BY MOUTH EVERY MORNING, Disp: , Rfl:  .  cetirizine (ZYRTEC) 10 MG tablet, Take by mouth., Disp: , Rfl:  .  clonazePAM (KLONOPIN) 1 MG tablet, TAKE 1/2 TABLET BY MOUTH EVERY MORNING, TAKE 1/2 TABLET BY MOUTH EVERY AFTERNOON & TAKE ONE TABLET BY MOUTH AT BEDTIME, Disp: , Rfl:  .  cyclobenzaprine (FLEXERIL) 10 MG tablet, Take 1 tablet (10 mg total) by mouth 3 (three) times daily as needed., Disp: 15 tablet, Rfl: 0 .  Erenumab-aooe (AIMOVIG) 70 MG/ML SOAJ, Inject 1 Dose into the skin every 28 (twenty-eight) days., Disp: , Rfl:  .  fluticasone (FLONASE) 50 MCG/ACT nasal spray, Place 2 sprays into both nostrils daily., Disp: 16 g, Rfl: 0 .  Fluticasone-Salmeterol (ADVAIR) 250-50 MCG/DOSE AEPB, Inhale 1 puff into the lungs daily., Disp: , Rfl:  .  gabapentin (NEURONTIN) 300 MG capsule, Take 300 mg by mouth 2 (two) times daily. , Disp: , Rfl: 0 .  glipiZIDE (GLUCOTROL) 5 MG tablet, Take 5 mg by mouth daily before breakfast., Disp: , Rfl:  .  HYDROcodone-acetaminophen (NORCO) 10-325 MG  tablet, TAKE 1 TABLET BY MOUTH EVERY 8 HOURS AS NEEDED FOR PAIN, Disp: , Rfl:  .  ibuprofen (ADVIL,MOTRIN) 600 MG tablet, Take 1 tablet (600 mg total) by mouth every 6 (six) hours as needed., Disp: 30 tablet, Rfl: 0 .  Lurasidone HCl (LATUDA) 60 MG TABS, Take 60 mg by mouth daily., Disp: , Rfl:  .  metFORMIN (GLUCOPHAGE) 500 MG tablet, Take 500 mg by mouth daily with breakfast., Disp: , Rfl:  .  montelukast (SINGULAIR) 10 MG tablet, Take by mouth., Disp: , Rfl:  .  pantoprazole (PROTONIX) 40 MG tablet, Take by mouth., Disp: , Rfl:  .  Spacer/Aero-Holding Chambers (AEROCHAMBER PLUS) inhaler, Use as instructed, Disp: 1 each, Rfl: 2 .  tiZANidine (ZANAFLEX)  4 MG tablet, Take 1 tablet (4 mg total) by mouth every 8 (eight) hours as needed for muscle spasms., Disp: 30 tablet, Rfl: 0 .  traZODone (DESYREL) 100 MG tablet, Take 200 mg by mouth at bedtime., Disp: , Rfl:   Social History   Tobacco Use  Smoking Status Former Smoker  Smokeless Tobacco Never Used    Allergies  Allergen Reactions  . Cefprozil Other (See Comments)  . Amoxil [Amoxicillin] Rash  . Augmentin [Amoxicillin-Pot Clavulanate] Rash   Objective:  There were no vitals filed for this visit. There is no height or weight on file to calculate BMI. Constitutional Well developed. Well nourished.  Vascular Dorsalis pedis pulses palpable bilaterally. Posterior tibial pulses palpable bilaterally. Capillary refill normal to all digits.  No cyanosis or clubbing noted. Pedal hair growth normal.  Neurologic Normal speech. Oriented to person, place, and time. Epicritic sensation to light touch grossly present bilaterally.  Dermatologic Painful ingrowing nail at lateral nail borders of the hallux nail left. No other open wounds. No skin lesions.  Orthopedic: Normal joint ROM without pain or crepitus bilaterally. No visible deformities. No bony tenderness.   Radiographs: None Assessment:   1. Ingrown toenail of left foot   2. Great toe pain, left    Plan:  Patient was evaluated and treated and all questions answered.  Ingrown Nail, left -Patient elects to proceed with minor surgery to remove ingrown toenail removal today. Consent reviewed and signed by patient. -Ingrown nail excised. See procedure note. -Educated on post-procedure care including soaking. Written instructions provided and reviewed. -Patient to follow up in 2 weeks for nail check.  Procedure: Excision of Ingrown Toenail Location: Left 1st toe lateral nail borders. Anesthesia: Lidocaine 1% plain; 1.5 mL and Marcaine 0.5% plain; 1.5 mL, digital block. Skin Prep: Betadine. Dressing: Silvadene; telfa; dry,  sterile, compression dressing. Technique: Following skin prep, the toe was exsanguinated and a tourniquet was secured at the base of the toe. The affected nail border was freed, split with a nail splitter, and excised. Chemical matrixectomy was then performed with phenol and irrigated out with alcohol. The tourniquet was then removed and sterile dressing applied. Disposition: Patient tolerated procedure well. Patient to return in 2 weeks for follow-up.   No follow-ups on file.

## 2019-06-12 ENCOUNTER — Ambulatory Visit: Payer: Medicaid Other | Admitting: Podiatry

## 2019-06-12 ENCOUNTER — Other Ambulatory Visit: Payer: Self-pay

## 2019-06-12 ENCOUNTER — Encounter: Payer: Self-pay | Admitting: Podiatry

## 2019-06-12 DIAGNOSIS — M79675 Pain in left toe(s): Secondary | ICD-10-CM

## 2019-06-12 DIAGNOSIS — L600 Ingrowing nail: Secondary | ICD-10-CM | POA: Diagnosis not present

## 2019-06-12 NOTE — Progress Notes (Signed)
Subjective: Yvonne Rios is a 29 y.o.  female returns to office today for follow up evaluation after having left Hallux Lateral border nail avulsion performed. Patient has been soaking using epsom salt and applying topical antibiotic covered with bandaid daily. Patient denies fevers, chills, nausea, vomiting. Denies any calf pain, chest pain, SOB.   Objective:  Vitals: Reviewed  General: Well developed, nourished, in no acute distress, alert and oriented x3   Dermatology: Skin is warm, dry and supple bilateral. Lateral hallux nail border appears to be clean, dry, with mild granular tissue and surrounding scab. There is no surrounding erythema, edema, drainage/purulence. The remaining nails appear unremarkable at this time. There are no other lesions or other signs of infection present.  Neurovascular status: Intact. No lower extremity swelling; No pain with calf compression bilateral.  Musculoskeletal: Decreased tenderness to palpation of the Lateral hallux nail fold(s). Muscular strength within normal limits bilateral.   Assesement and Plan: S/p partial nail avulsion, doing well.   -Continue soaking in epsom salts twice a day followed by antibiotic ointment and a band-aid. Can leave uncovered at night. Continue this until completely healed.  -If the area has not healed in 2 weeks, call the office for follow-up appointment, or sooner if any problems arise.  -Monitor for any signs/symptoms of infection. Call the office immediately if any occur or go directly to the emergency room. Call with any questions/concerns.  Nicholes Rough, DPM

## 2019-07-18 DIAGNOSIS — F900 Attention-deficit hyperactivity disorder, predominantly inattentive type: Secondary | ICD-10-CM | POA: Diagnosis not present

## 2019-07-18 DIAGNOSIS — F4010 Social phobia, unspecified: Secondary | ICD-10-CM | POA: Diagnosis not present

## 2019-07-18 DIAGNOSIS — F411 Generalized anxiety disorder: Secondary | ICD-10-CM | POA: Diagnosis not present

## 2019-07-18 DIAGNOSIS — F3181 Bipolar II disorder: Secondary | ICD-10-CM | POA: Diagnosis not present

## 2019-09-28 DIAGNOSIS — F988 Other specified behavioral and emotional disorders with onset usually occurring in childhood and adolescence: Secondary | ICD-10-CM | POA: Insufficient documentation

## 2019-11-21 ENCOUNTER — Encounter: Payer: Self-pay | Admitting: Advanced Practice Midwife

## 2019-11-21 ENCOUNTER — Ambulatory Visit (INDEPENDENT_AMBULATORY_CARE_PROVIDER_SITE_OTHER): Payer: Medicaid Other | Admitting: Advanced Practice Midwife

## 2019-11-21 ENCOUNTER — Encounter: Payer: Medicaid Other | Admitting: Advanced Practice Midwife

## 2019-11-21 ENCOUNTER — Other Ambulatory Visit: Payer: Self-pay

## 2019-11-21 VITALS — BP 125/82 | HR 73 | Wt 345.0 lb

## 2019-11-21 DIAGNOSIS — N912 Amenorrhea, unspecified: Secondary | ICD-10-CM | POA: Diagnosis not present

## 2019-11-21 DIAGNOSIS — O3680X0 Pregnancy with inconclusive fetal viability, not applicable or unspecified: Secondary | ICD-10-CM | POA: Diagnosis not present

## 2019-11-21 LAB — POCT URINE PREGNANCY: Preg Test, Ur: POSITIVE — AB

## 2019-11-21 NOTE — Progress Notes (Signed)
This encounter was created in error - please disregard.

## 2019-11-21 NOTE — Progress Notes (Signed)
Patient ID: Yvonne Rios, female   DOB: 1990/10/05, 29 y.o.   MRN: [id number]  Reason for Consult: ER follow up (possible miscarriage)   Referred by Verita Lamb, NP  Subjective:  HPI:  Yvonne Rios is a 29 y.o. female being seen for follow up regarding positive urine and blood pregnancy tests in the past couple of weeks. She was seen at [facility] for left lower quadrant pain on June 9th. Ultrasound at that time was inconclusive regarding status of pregnancy.  -- No definite intrauterine pregnancy identified. 0.4 cm cystic structure in the lower uterine segment, nonspecific. Given positive pregnancy test, the findings are suggestive of pregnancy of unknown location. Differential diagnosis includes early intrauterine pregnancy, failed pregnancy, and ectopic pregnancy. Recommend clinical correlation, serial quantitative beta HCGs, ectopic precautions, and followup ultrasound as clinically appropriate.   Beta Hcg was 41.7 on the 9th and 93.4 on the 11th.  Urine Culture/Vaginitis/STD labs were negative Of note: Hgb A1C was elevated at 9.4  She denies pain since she was seen on June 9th. She denies vaginal bleeding. She denies burning with urination. She has some frequency and no urgency. She has experienced some fatigue and light headedness recently. She and her husband have been trying to conceive for some time. She has irregular periods with cycles from 28-40 days lasting 3-4 days, heavy flow sometimes.  We discussed findings from last week at [facility] and recommendations for follow up. Urine pregnancy test today is faint positive.  Past Medical History:  Diagnosis Date  . ADD (attention deficit disorder)   . Anxiety   . Asthma   . Depression   . Diabetes mellitus without complication ([place])   . GERD (gastroesophageal reflux disease)   . IBS (irritable bowel syndrome)   . Migraine   . MRSA (methicillin resistant staph aureus) culture positive   . Oligomenorrhea   .  Pyloric stenosis   . UTI (urinary tract infection)    Family History  Problem Relation Age of Onset  . Ovarian cancer Mother 41  . Other Father    Past Surgical History:  Procedure Laterality Date  . COLPOSCOPY    . DILATION AND CURETTAGE OF UTERUS  age 26  . HYSTEROSCOPY  2015  . TONSILLECTOMY    . WISDOM TOOTH EXTRACTION      Short Social History:  Social History   Tobacco Use  . Smoking status: Former Research scientist (life sciences)  . Smokeless tobacco: Never Used  Substance Use Topics  . Alcohol use: Not Currently    Alcohol/week: 0.0 standard drinks    Comment: rarely    Allergies  Allergen Reactions  . Cefprozil Other (See Comments)  . Amoxil [Amoxicillin] Rash  . Augmentin [Amoxicillin-Pot Clavulanate] Rash    Current Outpatient Medications  Medication Sig Dispense Refill  . ADDERALL XR 20 MG 24 hr capsule Take 1 capsule by mouth 2 (two) times daily.  0  . albuterol (PROVENTIL HFA;VENTOLIN HFA) 108 (90 Base) MCG/ACT inhaler Inhale 2 puffs into the lungs every 4 (four) hours as needed for wheezing or shortness of breath. 1 Inhaler 0  . beclomethasone (QVAR) 80 MCG/ACT inhaler Inhale into the lungs.    . butalbital-acetaminophen-caffeine (FIORICET) 50-325-40 MG tablet Take by mouth.    . cariprazine (VRAYLAR) capsule TAKE ONE CAPSULE BY MOUTH EVERY MORNING FOR 7 DAYS, THEN TAKE TWO CAPSULES BY MOUTH EVERY MORNING    . cetirizine (ZYRTEC) 10 MG tablet Take by mouth.    . clonazePAM (KLONOPIN) 1 MG tablet  TAKE 1/2 TABLET BY MOUTH EVERY MORNING, TAKE 1/2 TABLET BY MOUTH EVERY AFTERNOON & TAKE ONE TABLET BY MOUTH AT BEDTIME    . EPINEPHrine 0.3 mg/0.3 mL IJ SOAJ injection USE AS DIRECTED PER MD FOR ANAPHYLAXIS    . Erenumab-aooe (AIMOVIG) 70 MG/ML SOAJ Inject 1 Dose into the skin every 28 (twenty-eight) days.    . famotidine (PEPCID) 20 MG tablet Take by mouth.    . fluticasone (FLONASE) 50 MCG/ACT nasal spray Place 2 sprays into both nostrils daily. 16 g 0  . Fluticasone-Salmeterol  (ADVAIR) 250-50 MCG/DOSE AEPB Inhale 1 puff into the lungs daily.    Marland Kitchen glipiZIDE (GLUCOTROL) 5 MG tablet Take 5 mg by mouth daily before breakfast.    . metFORMIN (GLUCOPHAGE) 500 MG tablet Take 500 mg by mouth daily with breakfast.    . montelukast (SINGULAIR) 10 MG tablet Take by mouth.    . pantoprazole (PROTONIX) 40 MG tablet Take by mouth.    . Spacer/Aero-Holding Chambers (AEROCHAMBER PLUS) inhaler Use as instructed 1 each 2  . traZODone (DESYREL) 100 MG tablet Take 200 mg by mouth at bedtime.     No current facility-administered medications for this visit.    Review of Systems  Constitutional: Positive for malaise/fatigue. Negative for chills and fever.  HENT: Negative for congestion, ear discharge, ear pain, hearing loss, sinus pain and sore throat.   Eyes: Negative for blurred vision and double vision.  Respiratory: Negative for cough, shortness of breath and wheezing.   Cardiovascular: Negative for chest pain, palpitations and leg swelling.  Gastrointestinal: Negative for abdominal pain, blood in stool, constipation, diarrhea, heartburn, melena, nausea and vomiting.  Genitourinary: Negative for dysuria, flank pain, frequency, hematuria and urgency.  Musculoskeletal: Negative for back pain, joint pain and myalgias.  Skin: Negative for itching and rash.  Neurological: Negative for dizziness, tingling, tremors, sensory change, speech change, focal weakness, seizures, loss of consciousness, weakness and headaches.       Positive for lightheaded  Endo/Heme/Allergies: Negative for environmental allergies. Does not bruise/bleed easily.  Psychiatric/Behavioral: Negative for depression, hallucinations, memory loss, substance abuse and suicidal ideas. The patient is not nervous/anxious and does not have insomnia.         Objective:  Objective   Vitals:   11/21/19 1555  BP: 125/82  Pulse: 73  Weight: (!) 345 lb (156.5 kg)   Body mass index is 52.46 kg/m. Constitutional: Obese,  well developed female in no acute distress.  HEENT: normal Skin: Warm and dry.  Respiratory:  Normal respiratory effort Neuro: DTRs 2+, Cranial nerves grossly intact Psych: Alert and Oriented x3. No memory deficits. Normal mood and affect.  MS: normal gait, normal bilateral lower extremity ROM/strength/stability.   Limited physical exam today. The majority of the visit was spent in counseling.      Assessment/Plan:     29 y.o. G3 P 33 female with pregnancy of unknown anatomic location or gestational age.   Beta Hcg today and repeat on Friday Follow up ultrasound and provider visit in 1-2 weeks  Go to ER if severe symptoms develop   Tresea Mall CNM [facility] 11/21/2019, 4:38 PM

## 2019-11-22 LAB — BETA HCG QUANT (REF LAB): hCG Quant: 747 m[IU]/mL

## 2019-11-23 ENCOUNTER — Other Ambulatory Visit: Payer: Self-pay | Admitting: Advanced Practice Midwife

## 2019-11-23 NOTE — Telephone Encounter (Signed)
Yvonne Rios, please call pt regarding her results. Pt aware if she went today for second BHCG, the result may not be back until tomorrow.

## 2019-11-24 LAB — BETA HCG QUANT (REF LAB): hCG Quant: 1219 m[IU]/mL

## 2019-11-29 ENCOUNTER — Other Ambulatory Visit: Payer: Self-pay

## 2019-11-29 ENCOUNTER — Other Ambulatory Visit: Payer: Self-pay | Admitting: Advanced Practice Midwife

## 2019-11-29 DIAGNOSIS — Z3490 Encounter for supervision of normal pregnancy, unspecified, unspecified trimester: Secondary | ICD-10-CM

## 2019-11-29 DIAGNOSIS — O3680X0 Pregnancy with inconclusive fetal viability, not applicable or unspecified: Secondary | ICD-10-CM

## 2019-11-29 NOTE — Progress Notes (Signed)
Beta hcg ordered for lab-only visit per patient request. She will have u/s in 4 days to determine anatomic location of pregnancy.

## 2019-11-30 LAB — BETA HCG QUANT (REF LAB): hCG Quant: 5044 m[IU]/mL

## 2019-12-03 ENCOUNTER — Ambulatory Visit (INDEPENDENT_AMBULATORY_CARE_PROVIDER_SITE_OTHER): Payer: Medicaid Other

## 2019-12-03 ENCOUNTER — Other Ambulatory Visit: Payer: Self-pay | Admitting: Advanced Practice Midwife

## 2019-12-03 ENCOUNTER — Other Ambulatory Visit: Payer: Self-pay

## 2019-12-03 ENCOUNTER — Ambulatory Visit: Payer: Medicaid Other | Admitting: Obstetrics and Gynecology

## 2019-12-03 DIAGNOSIS — O3680X0 Pregnancy with inconclusive fetal viability, not applicable or unspecified: Secondary | ICD-10-CM | POA: Diagnosis not present

## 2019-12-03 DIAGNOSIS — Z3A01 Less than 8 weeks gestation of pregnancy: Secondary | ICD-10-CM

## 2019-12-07 ENCOUNTER — Telehealth: Payer: Self-pay

## 2019-12-07 NOTE — Telephone Encounter (Signed)
Pt's boyfriend called; doesn't have NOB appt; has sent several msgs in mychart c no response; needs to have appt so WIC can be started and to know what meds can take.  [phone]  Pt states she had an appt c AMS on Mon but he had emergency surgery at hosp.  Doesn't know results of u/s - adv it was good she was [redacted]w[redacted]d on Mon; to schedule NOB appt - she will get a lot of information at that appt.  Tx'd to [facility] for scheduling.

## 2019-12-24 ENCOUNTER — Telehealth: Payer: Self-pay

## 2019-12-24 NOTE — Telephone Encounter (Signed)
Pt calling triage reporting that she is spotting has been for a week and is [redacted] weeks pregnant, from light brown to light pink. The bleeding keeps stopping and starting. Per [place] Dec you have no openings, Do you want to work her in? I advised pt She could go to the ER and be seen, she wanted me to ask AMS advice.Marland Kitchen

## 2019-12-25 NOTE — Telephone Encounter (Signed)
ER if we have no opening, she can also see any provider that has an opening she is not a private patient

## 2019-12-25 NOTE — Telephone Encounter (Signed)
Pt aware to go to the er

## 2019-12-25 NOTE — Telephone Encounter (Signed)
Called pt to advise her to go to the ER, our earliest appointment will be next week sometime. Pt mailbox is full and she did not answer

## 2019-12-28 ENCOUNTER — Other Ambulatory Visit: Payer: Self-pay | Admitting: Obstetrics and Gynecology

## 2019-12-28 ENCOUNTER — Encounter: Payer: Self-pay | Admitting: Obstetrics and Gynecology

## 2019-12-28 ENCOUNTER — Ambulatory Visit (INDEPENDENT_AMBULATORY_CARE_PROVIDER_SITE_OTHER): Payer: Medicaid Other | Admitting: Obstetrics and Gynecology

## 2019-12-28 ENCOUNTER — Other Ambulatory Visit (HOSPITAL_COMMUNITY)
Admission: RE | Admit: 2019-12-28 | Discharge: 2019-12-28 | Disposition: A | Discharge status: Home / Self Care | Payer: Medicaid Other | Source: Ambulatory Visit | Attending: Obstetrics and Gynecology | Admitting: Obstetrics and Gynecology

## 2019-12-28 ENCOUNTER — Other Ambulatory Visit: Payer: Self-pay

## 2019-12-28 VITALS — BP 120/80 | Wt 350.0 lb

## 2019-12-28 DIAGNOSIS — O209 Hemorrhage in early pregnancy, unspecified: Secondary | ICD-10-CM

## 2019-12-28 DIAGNOSIS — O99210 Obesity complicating pregnancy, unspecified trimester: Secondary | ICD-10-CM | POA: Insufficient documentation

## 2019-12-28 DIAGNOSIS — Z113 Encounter for screening for infections with a predominantly sexual mode of transmission: Secondary | ICD-10-CM | POA: Diagnosis not present

## 2019-12-28 DIAGNOSIS — Z124 Encounter for screening for malignant neoplasm of cervix: Secondary | ICD-10-CM

## 2019-12-28 DIAGNOSIS — Z3480 Encounter for supervision of other normal pregnancy, unspecified trimester: Secondary | ICD-10-CM

## 2019-12-28 DIAGNOSIS — O99519 Diseases of the respiratory system complicating pregnancy, unspecified trimester: Secondary | ICD-10-CM

## 2019-12-28 DIAGNOSIS — Z3A10 10 weeks gestation of pregnancy: Secondary | ICD-10-CM

## 2019-12-28 DIAGNOSIS — O0990 Supervision of high risk pregnancy, unspecified, unspecified trimester: Secondary | ICD-10-CM

## 2019-12-28 DIAGNOSIS — J45909 Unspecified asthma, uncomplicated: Secondary | ICD-10-CM

## 2019-12-28 DIAGNOSIS — Q999 Chromosomal abnormality, unspecified: Secondary | ICD-10-CM

## 2019-12-28 DIAGNOSIS — O24119 Pre-existing diabetes mellitus, type 2, in pregnancy, unspecified trimester: Secondary | ICD-10-CM | POA: Insufficient documentation

## 2019-12-28 LAB — OB RESULTS CONSOLE VARICELLA ZOSTER ANTIBODY, IGG: Varicella: IMMUNE

## 2019-12-28 MED ORDER — METFORMIN HCL 500 MG PO TABS: 500.0000 mg | ORAL_TABLET | Freq: Every day | ORAL | 11 refills | Status: DC

## 2019-12-28 MED ORDER — GLIPIZIDE 5 MG PO TABS: 5.0000 mg | ORAL_TABLET | Freq: Every day | ORAL | 11 refills | Status: DC

## 2019-12-28 MED ORDER — ADDERALL XR 20 MG PO CP24: 20.0000 mg | ORAL_CAPSULE | Freq: Two times a day (BID) | ORAL | 0 refills | Status: DC

## 2019-12-28 MED ORDER — PANTOPRAZOLE SODIUM 40 MG PO TBEC: 40.0000 mg | DELAYED_RELEASE_TABLET | Freq: Every day | ORAL | 11 refills | Status: AC

## 2019-12-28 MED ORDER — DOXYLAMINE-PYRIDOXINE 10-10 MG PO TBEC: 2.0000 | DELAYED_RELEASE_TABLET | Freq: Every day | ORAL | 5 refills | Status: DC

## 2019-12-28 MED ORDER — MONTELUKAST SODIUM 10 MG PO TABS: 10.0000 mg | ORAL_TABLET | Freq: Every day | ORAL | 11 refills | Status: AC

## 2019-12-28 MED ORDER — CLONAZEPAM 1 MG PO TABS: ORAL_TABLET | ORAL | 0 refills | Status: DC

## 2019-12-28 MED ORDER — FLUTICASONE-SALMETEROL 250-50 MCG/DOSE IN AEPB: 1.0000 | INHALATION_SPRAY | Freq: Every day | RESPIRATORY_TRACT | 3 refills | Status: AC

## 2019-12-28 MED ORDER — FAMOTIDINE 20 MG PO TABS: 20.0000 mg | ORAL_TABLET | Freq: Every day | ORAL | 11 refills | Status: AC

## 2019-12-28 MED ORDER — CETIRIZINE HCL 10 MG PO TABS: 10.0000 mg | ORAL_TABLET | Freq: Every day | ORAL | 11 refills | Status: DC

## 2019-12-28 MED ORDER — SERTRALINE HCL 50 MG PO TABS: 50.0000 mg | ORAL_TABLET | Freq: Every day | ORAL | 2 refills | Status: AC

## 2019-12-28 NOTE — Progress Notes (Signed)
New Obstetric Patient H&P    Chief Complaint: "Desires prenatal care"   History of Present Illness: Patient is a 29 y.o. [id number] Not Hispanic or Latino female, presents with amenorrhea and positive home pregnancy test. Patient's last menstrual period was 10/18/2019. and based on her  LMP, her EDD is Estimated Date of Delivery: 07/24/20 and her EGA is [redacted]w[redacted]d.     She had a urine pregnancy test which was positive 4 week(s)  ago.Since her LMP she claims she has experienced nausea, breast tenderness, fatigue. She reports intermittent vaginal bleeding, ultrasound confirmed viability. Her past medical history is contibutory. 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     Since her LMP, she admits to the use of tobacco products  no There are cats in the home in the home  no  She admits close contact with children on a regular basis  yes  She has had chicken pox in the past yes She has had Tuberculosis exposures, symptoms, or previously tested positive for TB   no Current or past history of domestic violence. no  Genetic Screening/Teratology Counseling: (Includes patient, baby's father, or anyone in either family with:)   1. Patient's age >/= 58 at [facility]  no 2. Thalassemia ([place], [place], Mediterranean, or Asian background): MCV<80  no 3. Neural tube defect (meningomyelocele, spina bifida, anencephaly)  no 4. Congenital heart defect  no  5. Down syndrome  no 6. Tay-Sachs (Jewish, [place]  no 7. Canavan's Disease  no 8. Sickle cell disease or trait (African)  no  9. Hemophilia or other blood disorders  no  10. Muscular dystrophy  no  11. Cystic fibrosis  no  12. Huntington's Chorea  no  13. 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 Other inherited genetic or chromosomal disorder  Yes see above #13 15. Maternal metabolic disorder (DM, PKU, etc)  YES DMII 16. Patient or FOB with a child with a birth defect not  listed above no  16a. Patient or FOB with a birth defect themselves no 17. Recurrent pregnancy loss, or stillbirth  no  18. Any medications since LMP other than prenatal vitamins (include vitamins, supplements, OTC meds, drugs, alcohol)  Yes 19. Any other genetic/environmental exposure to discuss  no  Infection History:   1. Lives with someone with TB or TB exposed  no  2. Patient or partner has history of genital herpes  no 3. Rash or viral illness since LMP  no 4. History of STI (GC, CT, HPV, syphilis, HIV)  no 5. History of recent travel :  no  Other pertinent information:  no     Review of Systems:10 point review of systems negative unless otherwise noted in HPI  Past Medical History:  Patient Active Problem List   Diagnosis Date Noted  . Myopia, left eye 12/07/2017    Last Assessment & Plan:  - Continue without glasses   . Type 2 diabetes mellitus without complication, without long-term current use of insulin (HCC) 12/07/2017    Last Assessment & Plan:  - A1C 7.2 - Continue good dietary / medical management   . Migraine without status migrainosus, not intractable 11/19/2015  . Morbid obesity due to excess calories (HCC) 11/19/2015  . Environmental allergies 03/17/2015  . Asthma 11/21/2014  . Mild intermittent asthma without complication 10/15/2014  . Left-sided low back pain with sciatica 08/09/2014  . Insomnia 11/20/2013  . Viral sinusitis 07/31/2013  . URI, acute 07/31/2013  Last Assessment & Plan:  Patient symptoms seem consistent with an upper respiratory tract infection.  Symptoms have been present for 2 weeks and do not appear to be improving.  Patient also has a history of asthma and has noticed some increased wheezing and shortness of breath.  Patient will be referred to the [facility] for a limited exam.   . 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 2016   . GERD (gastroesophageal reflux disease) 07/03/2013  .  Anxiety 05/16/2013  . Depression 05/16/2013    Past Surgical History:  Past Surgical History:  Procedure Laterality Date  . COLPOSCOPY    . DILATION AND CURETTAGE OF UTERUS  age 29  . HYSTEROSCOPY  2015  . TONSILLECTOMY    . WISDOM TOOTH EXTRACTION      Gynecologic History: Patient's last menstrual period was 10/18/2019.  Obstetric History: [id number]  Family History:  Family History  Problem Relation Age of Onset  . Ovarian cancer Mother 2530  . Other Father     Social History:  Social History   Socioeconomic History  . Marital status: Married    Spouse name: Not on file  . Number of children: Not on file  . Years of education: Not on file  . Highest education level: Not on file  Occupational History  . Not on file  Tobacco Use  . Smoking status: Former Games developermoker  . Smokeless tobacco: Never Used  Vaping Use  . Vaping Use: Never used  Substance and Sexual Activity  . Alcohol use: Not Currently    Alcohol/week: 0.0 standard drinks    Comment: rarely  . Drug use: No  . Sexual activity: Yes    Partners: Male  Other Topics Concern  . Not on file  Social History Narrative  . Not on file   Social Determinants of Health   Financial Resource Strain:   . Difficulty of Paying Living Expenses:   Food Insecurity:   . Worried About Programme researcher, broadcasting/film/videounning Out of Food in the Last Year:   . Baristaan Out of Food in the Last Year:   Transportation Needs:   . Freight forwarderLack of Transportation (Medical):   Marland Kitchen. Lack of Transportation (Non-Medical):   Physical Activity:   . Days of Exercise per Week:   . Minutes of Exercise per Session:   Stress:   . Feeling of Stress :   Social Connections:   . Frequency of Communication with Friends and Family:   . Frequency of Social Gatherings with Friends and Family:   . Attends Religious Services:   . Active Member of Clubs or Organizations:   . Attends BankerClub or Organization Meetings:   Marland Kitchen. Marital Status:   Intimate Partner Violence:   . Fear of Current or  Ex-Partner:   . Emotionally Abused:   Marland Kitchen. Physically Abused:   . Sexually Abused:     Allergies:  Allergies  Allergen Reactions  . Cefprozil Other (See Comments)  . Amoxil [Amoxicillin] Rash  . Augmentin [Amoxicillin-Pot Clavulanate] Rash    Medications: Prior to Admission medications   Medication Sig Start Date End Date Taking? Authorizing Provider  ADDERALL XR 20 MG 24 hr capsule Take 1 capsule by mouth 2 (two) times daily. 03/05/15  Yes [provider]  albuterol (PROVENTIL HFA;VENTOLIN HFA) 108 (90 Base) MCG/ACT inhaler Inhale 2 puffs into the lungs every 4 (four) hours as needed for wheezing or shortness of breath. 05/01/17  Yes Domenick GongMortenson, Ashley, MD  beclomethasone (QVAR) 80  MCG/ACT inhaler Inhale into the lungs. 08/15/19 08/14/20 Yes [provider]  butalbital-acetaminophen-caffeine (FIORICET) 50-325-40 MG tablet Take by mouth. 05/17/19 05/16/20 Yes [provider]  cariprazine (VRAYLAR) capsule TAKE ONE CAPSULE BY MOUTH EVERY MORNING FOR 7 DAYS, THEN TAKE TWO CAPSULES BY MOUTH EVERY MORNING 04/10/19  Yes [provider]  cetirizine (ZYRTEC) 10 MG tablet Take by mouth. 01/03/19 01/04/20 Yes [provider]  clonazePAM (KLONOPIN) 1 MG tablet TAKE 1/2 TABLET BY MOUTH EVERY MORNING, TAKE 1/2 TABLET BY MOUTH EVERY AFTERNOON & TAKE ONE TABLET BY MOUTH AT BEDTIME 03/10/19  Yes [provider]  EPINEPHrine 0.3 mg/0.3 mL IJ SOAJ injection USE AS DIRECTED PER MD FOR ANAPHYLAXIS 06/09/19  Yes [provider]  Erenumab-aooe (AIMOVIG) 70 MG/ML SOAJ Inject 1 Dose into the skin every 28 (twenty-eight) days.   Yes [provider]  famotidine (PEPCID) 20 MG tablet Take by mouth. 08/15/19 08/14/20 Yes [provider]  fluticasone (FLONASE) 50 MCG/ACT nasal spray Place 2 sprays into both nostrils daily. 05/01/17  Yes Domenick Gong, MD  Fluticasone-Salmeterol (ADVAIR) 250-50 MCG/DOSE AEPB Inhale 1 puff into the lungs daily.    Yes [provider]  glipiZIDE (GLUCOTROL) 5 MG tablet Take 5 mg by mouth daily before breakfast.   Yes [provider]  metFORMIN (GLUCOPHAGE) 500 MG tablet Take 500 mg by mouth daily with breakfast.   Yes [provider]  montelukast (SINGULAIR) 10 MG tablet Take by mouth. 04/04/19 04/03/20 Yes [provider]  pantoprazole (PROTONIX) 40 MG tablet Take by mouth. 01/03/19 01/03/20 Yes [provider]  Spacer/Aero-Holding Chambers (AEROCHAMBER PLUS) inhaler Use as instructed 05/01/17  Yes Domenick Gong, MD  traZODone (DESYREL) 100 MG tablet Take 200 mg by mouth at bedtime.   Yes [provider]    Physical Exam Vitals: Blood pressure 120/80, weight (!) 350 lb (158.8 kg), last menstrual period 10/18/2019.  General: NAD HEENT: normocephalic, anicteric Thyroid: no enlargement, no palpable nodules Pulmonary: No increased work of breathing, CTAB Cardiovascular: RRR, distal pulses 2+ Abdomen: NABS, soft, non-tender, non-distended.  Umbilicus without lesions.  No hepatomegaly, splenomegaly or masses palpable. No evidence of hernia  Genitourinary:  External: Normal external female genitalia.  Normal urethral meatus, normal  Bartholin's and Skene's glands.    Vagina: Normal vaginal mucosa, no evidence of prolapse.    Cervix: Grossly normal in appearance, no bleeding  Uterus:  Non-enlarged, mobile, normal contour.  No CMT  Adnexa: ovaries non-enlarged, no adnexal masses  Rectal: deferred Extremities: no edema, erythema, or tenderness Neurologic: Grossly intact Psychiatric: mood appropriate, affect full   Assessment: 29 y.o. [id number] at [redacted]w[redacted]d presenting to initiate prenatal care  Plan: 1) Avoid alcoholic beverages. 2) Patient encouraged not to smoke.  3) Discontinue the use of all non-medicinal drugs and chemicals.  4) Take prenatal vitamins daily.  5) Nutrition, food safety (fish, cheese advisories, and high nitrite foods) and exercise  discussed. 6) Hospital and practice style discussed with cross coverage system.  7) Genetic Screening, such as with 1st Trimester Screening, cell free fetal DNA, AFP testing, and Ultrasound, as well as with amniocentesis and CVS as appropriate, is discussed with patient. At the conclusion of today's visit patient requested genetic testing 8) Rh positive, follow up ultrasound given bleeding 9) Fetal echo 10) Baseline CMP, P/C ratio 11) Continue current DM meds glipizide no risk of birth defects but risk fetal hypoglycemia same as glyburide.  Anticipate change to insulin once BG log reviewed next week.  HgbA1C obtained  Vena [place], MD, [facility], [facility] 12/28/2019, 2:21 PM

## 2019-12-28 NOTE — Progress Notes (Signed)
NOB- been spotting on/off

## 2019-12-29 LAB — RPR+RH+ABO+RUB AB+AB SCR+CB...
Antibody Screen: NEGATIVE
HIV Screen 4th Generation wRfx: NONREACTIVE
Hematocrit: 39.2 % (ref 34.0–46.6)
Hemoglobin: 13 g/dL (ref 11.1–15.9)
Hepatitis B Surface Ag: NEGATIVE
MCH: 26.5 pg — ABNORMAL LOW (ref 26.6–33.0)
MCHC: 33.2 g/dL (ref 31.5–35.7)
MCV: 80 fL (ref 79–97)
Platelets: 276 10*3/uL (ref 150–450)
RBC: 4.91 x10E6/uL (ref 3.77–5.28)
RDW: 14.4 % (ref 11.7–15.4)
RPR Ser Ql: NONREACTIVE
Rh Factor: POSITIVE
Rubella Antibodies, IGG: 16.4 index (ref >0.99)
Varicella zoster IgG: 1207 index (ref >165)
WBC: 10.6 10*3/uL (ref 3.4–10.8)

## 2019-12-29 LAB — COMPREHENSIVE METABOLIC PANEL
ALT: 14 IU/L (ref 0–32)
AST: 14 IU/L (ref 0–40)
Albumin/Globulin Ratio: 1.3 (ref 1.2–2.2)
Albumin: 4 g/dL (ref 3.9–5.0)
Alkaline Phosphatase: 83 IU/L (ref 48–121)
BUN/Creatinine Ratio: 13 (ref 9–23)
BUN: 7 mg/dL (ref 6–20)
Bilirubin Total: 0.2 mg/dL (ref 0.0–1.2)
CO2: 20 mmol/L (ref 20–29)
Calcium: 9.3 mg/dL (ref 8.7–10.2)
Chloride: 99 mmol/L (ref 96–106)
Creatinine, Ser: 0.53 mg/dL — ABNORMAL LOW (ref 0.57–1.00)
GFR calc Af Amer: 148 mL/min/{1.73_m2} (ref >59)
GFR calc non Af Amer: 129 mL/min/{1.73_m2} (ref >59)
Globulin, Total: 3.1 g/dL (ref 1.5–4.5)
Glucose: 177 mg/dL — ABNORMAL HIGH (ref 65–99)
Potassium: 4.2 mmol/L (ref 3.5–5.2)
Sodium: 135 mmol/L (ref 134–144)
Total Protein: 7.1 g/dL (ref 6.0–8.5)

## 2019-12-29 LAB — PROTEIN / CREATININE RATIO, URINE
Creatinine, Urine: 189.6 mg/dL
Protein, Ur: 26.4 mg/dL
Protein/Creat Ratio: 139 mg/g creat (ref 0–200)

## 2019-12-29 LAB — HEMOGLOBIN A1C
Est. average glucose Bld gHb Est-mCnc: 223 mg/dL
Hgb A1c MFr Bld: 9.4 % — ABNORMAL HIGH (ref 4.8–5.6)

## 2019-12-30 LAB — URINE CULTURE: Organism ID, Bacteria: NO GROWTH

## 2019-12-31 DIAGNOSIS — J45909 Unspecified asthma, uncomplicated: Secondary | ICD-10-CM | POA: Insufficient documentation

## 2019-12-31 DIAGNOSIS — O209 Hemorrhage in early pregnancy, unspecified: Secondary | ICD-10-CM | POA: Insufficient documentation

## 2019-12-31 DIAGNOSIS — O0990 Supervision of high risk pregnancy, unspecified, unspecified trimester: Secondary | ICD-10-CM | POA: Insufficient documentation

## 2019-12-31 DIAGNOSIS — Q999 Chromosomal abnormality, unspecified: Secondary | ICD-10-CM | POA: Insufficient documentation

## 2019-12-31 DIAGNOSIS — O99519 Diseases of the respiratory system complicating pregnancy, unspecified trimester: Secondary | ICD-10-CM | POA: Insufficient documentation

## 2020-01-01 LAB — CYTOLOGY - PAP
Chlamydia: NEGATIVE
Comment: NEGATIVE
Comment: NORMAL
Diagnosis: NEGATIVE
Neisseria Gonorrhea: NEGATIVE

## 2020-01-07 ENCOUNTER — Other Ambulatory Visit: Payer: Self-pay

## 2020-01-07 ENCOUNTER — Ambulatory Visit (INDEPENDENT_AMBULATORY_CARE_PROVIDER_SITE_OTHER): Payer: Medicaid Other | Admitting: Obstetrics and Gynecology

## 2020-01-07 ENCOUNTER — Ambulatory Visit (INDEPENDENT_AMBULATORY_CARE_PROVIDER_SITE_OTHER): Payer: Medicaid Other

## 2020-01-07 VITALS — BP 132/82 | Wt 352.0 lb

## 2020-01-07 DIAGNOSIS — O24119 Pre-existing diabetes mellitus, type 2, in pregnancy, unspecified trimester: Secondary | ICD-10-CM

## 2020-01-07 DIAGNOSIS — Z3481 Encounter for supervision of other normal pregnancy, first trimester: Secondary | ICD-10-CM

## 2020-01-07 DIAGNOSIS — J45909 Unspecified asthma, uncomplicated: Secondary | ICD-10-CM

## 2020-01-07 DIAGNOSIS — Z3A11 11 weeks gestation of pregnancy: Secondary | ICD-10-CM

## 2020-01-07 DIAGNOSIS — O0990 Supervision of high risk pregnancy, unspecified, unspecified trimester: Secondary | ICD-10-CM

## 2020-01-07 DIAGNOSIS — Z3480 Encounter for supervision of other normal pregnancy, unspecified trimester: Secondary | ICD-10-CM

## 2020-01-07 DIAGNOSIS — O209 Hemorrhage in early pregnancy, unspecified: Secondary | ICD-10-CM

## 2020-01-07 DIAGNOSIS — Q999 Chromosomal abnormality, unspecified: Secondary | ICD-10-CM

## 2020-01-07 DIAGNOSIS — O99519 Diseases of the respiratory system complicating pregnancy, unspecified trimester: Secondary | ICD-10-CM

## 2020-01-07 MED ORDER — ACCU-CHEK AVIVA PLUS W/DEVICE KIT: 1.0000 [IU] | PACK | Freq: Four times a day (QID) | 0 refills | Status: AC

## 2020-01-07 MED ORDER — ACCU-CHEK SOFTCLIX LANCETS MISC: 1.0000 | Freq: Four times a day (QID) | 2 refills | Status: DC

## 2020-01-07 MED ORDER — NOVOFINE 32G X 6 MM MISC: 1.0000 [IU] | Freq: Four times a day (QID) | 3 refills | Status: DC

## 2020-01-07 MED ORDER — INSULIN GLARGINE 100 UNIT/ML SOLOSTAR PEN: 24.0000 [IU] | PEN_INJECTOR | Freq: Every day | SUBCUTANEOUS | 11 refills | Status: DC

## 2020-01-07 MED ORDER — GLUCOSE BLOOD VI STRP: ORAL_STRIP | 12 refills | Status: AC

## 2020-01-07 MED ORDER — HUMULIN R U-500 KWIKPEN 500 UNIT/ML ~~LOC~~ SOPN: 5.0000 [IU] | PEN_INJECTOR | Freq: Three times a day (TID) | SUBCUTANEOUS | 0 refills | Status: DC

## 2020-01-07 NOTE — Progress Notes (Signed)
Routine Prenatal Care Visit  Subjective  Yvonne Rios is a 29 y.o. G4P1021 at [redacted]w[redacted]d being seen today for ongoing prenatal care.  She is currently monitored for the following issues for this high-risk pregnancy and has Anxiety; Asthma; Depression; Environmental allergies; Functional constipation; GERD (gastroesophageal reflux disease); Insomnia; Left-sided low back pain with sciatica; Migraine without status migrainosus, not intractable; Mild intermittent asthma without complication; Morbid obesity due to excess calories (HCC); Myopia, left eye; Type 2 diabetes mellitus without complication, without long-term current use of insulin (HCC); Pre-existing type 2 diabetes mellitus during pregnancy, antepartum; Maternal obesity, antepartum; Supervision of high risk pregnancy, antepartum; First trimester bleeding; Asthma during pregnancy; and Chromosome abnormality on their problem list.  ----------------------------------------------------------------------------------- Patient reports no complaints.  No further bleeding. Contractions: Not present. Vag. Bleeding: None.  Movement: Absent. Denies leaking of fluid.  ----------------------------------------------------------------------------------- The following portions of the patient's history were reviewed and updated as appropriate: allergies, current medications, past family history, past medical history, past social history, past surgical history and problem list. Problem list updated.  US OB Comp Less 14 Wks  Result Date: 01/07/2020 Patient Name: Yvonne Rios DOB: 1990/11/25 MRN: [id number] ULTRASOUND REPORT Location: [facility] Date of Service: 01/07/2020 Indications:dating Findings: Mason Jim intrauterine pregnancy is visualized with a CRL consistent with [redacted]w[redacted]d gestation, giving an (U/S) EDD of 07/22/2020. The (U/S) EDD is consistent with the clinically established EDD of 07/24/2020. FHR: 160 BPM CRL measurement: 52.4 mm Yolk sac  visualization is suboptimal. Amnion: visualized and appears normal Right Ovary is normal in appearance. Left Ovary is normal appearance. Corpus luteal cyst:  Right ovary Survey of the adnexa demonstrates no adnexal masses. There is no free peritoneal fluid in the cul de sac. Impression: 1. [redacted]w[redacted]d Viable Singleton Intrauterine pregnancy by U/S. 2. (U/S) EDD is consistent with Clinically established EDD of 07/24/2020, [redacted]w[redacted]d. Recommendations: 1.Clinical correlation with the patient's History and Physical Exam. Deanna Artis, RT There is a viable singleton gestation.  The fetal biometry correlates with established dating. Detailed evaluation of the fetal anatomy is precluded by early gestational age.  It must be noted that a normal ultrasound particular at this early gestational age is unable to rule out fetal aneuploidy, risk of first trimester miscarriage, or anatomic birth defects. Vena Austria, MD, FACOG [facility], [facility] 01/07/2020, 5:21 PM    Objective  Blood pressure 132/82, weight (!) 352 lb (159.7 kg), last menstrual period 10/18/2019. Pregravid weight 350 lb (158.8 kg) Total Weight Gain 2 lb (0.907 kg)  Body mass index is 53.52 kg/m.  Urinalysis:      Fetal Status: Fetal Heart Rate (bpm): 160   Movement: Absent     General:  Alert, oriented and cooperative. Patient is in no acute distress.  Skin: Skin is warm and dry. No rash noted.   Cardiovascular: Normal heart rate noted  Respiratory: Normal respiratory effort, no problems with respiration noted  Abdomen: Soft, gravid, appropriate for gestational age. Pain/Pressure: Absent     Pelvic:  Cervical exam deferred        Extremities: Normal range of motion.     ental Status: Normal mood and affect. Normal behavior. Normal judgment and thought content.     Assessment   29 y.o. 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    1) DM II - Start Lantus 24U bedtime, HumulinR 5 units AC tid - stop glipizide  2) NIPT testing  - lab closed today obtain next visit  Return in about 1 week (around 01/14/2020) for ROB follow up MD only.  Vena Austria, MD, [facility], [facility] 01/07/2020, 5:21 PM

## 2020-01-07 NOTE — Progress Notes (Signed)
ROB Ultrasound today 

## 2020-01-09 ENCOUNTER — Telehealth: Payer: Self-pay

## 2020-01-09 NOTE — Telephone Encounter (Signed)
Raynelle Fanning from [facility] calling to verify dose of Humulin R U 500 quickpen;  5 units or did provider mean 25 units TID?

## 2020-01-10 NOTE — Telephone Encounter (Signed)
Julie aware

## 2020-01-10 NOTE — Telephone Encounter (Signed)
It is 5 units they are not open right now if you could call them and verify

## 2020-01-14 ENCOUNTER — Ambulatory Visit (INDEPENDENT_AMBULATORY_CARE_PROVIDER_SITE_OTHER): Payer: Medicaid Other | Admitting: Obstetrics and Gynecology

## 2020-01-14 ENCOUNTER — Other Ambulatory Visit: Payer: Self-pay

## 2020-01-14 VITALS — BP 132/80 | Wt 345.0 lb

## 2020-01-14 DIAGNOSIS — Z1379 Encounter for other screening for genetic and chromosomal anomalies: Secondary | ICD-10-CM

## 2020-01-14 DIAGNOSIS — Q999 Chromosomal abnormality, unspecified: Secondary | ICD-10-CM

## 2020-01-14 DIAGNOSIS — O0991 Supervision of high risk pregnancy, unspecified, first trimester: Secondary | ICD-10-CM

## 2020-01-14 DIAGNOSIS — O24119 Pre-existing diabetes mellitus, type 2, in pregnancy, unspecified trimester: Secondary | ICD-10-CM

## 2020-01-14 DIAGNOSIS — O99210 Obesity complicating pregnancy, unspecified trimester: Secondary | ICD-10-CM

## 2020-01-14 DIAGNOSIS — Z31438 Encounter for other genetic testing of female for procreative management: Secondary | ICD-10-CM

## 2020-01-14 DIAGNOSIS — O0990 Supervision of high risk pregnancy, unspecified, unspecified trimester: Secondary | ICD-10-CM

## 2020-01-14 DIAGNOSIS — Z3A12 12 weeks gestation of pregnancy: Secondary | ICD-10-CM

## 2020-01-14 DIAGNOSIS — O24111 Pre-existing diabetes mellitus, type 2, in pregnancy, first trimester: Secondary | ICD-10-CM

## 2020-01-14 DIAGNOSIS — O99211 Obesity complicating pregnancy, first trimester: Secondary | ICD-10-CM

## 2020-01-14 LAB — POCT URINALYSIS DIPSTICK OB
Glucose, UA: NEGATIVE
POC,PROTEIN,UA: NEGATIVE

## 2020-01-14 NOTE — Progress Notes (Signed)
ROB

## 2020-01-14 NOTE — Patient Instructions (Addendum)
Humulin R 10 Units with breakfast Lunch Dinner Lantus 36 Units at bedtime

## 2020-01-17 NOTE — Progress Notes (Signed)
Routine Prenatal Care Visit  Subjective  Yvonne Rios is a 29 y.o. G4P1021 at [redacted]w[redacted]d being seen today for ongoing prenatal care.  She is currently monitored for the following issues for this high-risk pregnancy and has Anxiety; Asthma; Depression; Environmental allergies; Functional constipation; GERD (gastroesophageal reflux disease); Insomnia; Left-sided low back pain with sciatica; Migraine without status migrainosus, not intractable; Mild intermittent asthma without complication; Morbid obesity due to excess calories (HCC); Myopia, left eye; Type 2 diabetes mellitus without complication, without long-term current use of insulin (HCC); Pre-existing type 2 diabetes mellitus during pregnancy, antepartum; Maternal obesity, antepartum; Supervision of high risk pregnancy, antepartum; First trimester bleeding; Asthma during pregnancy; and Chromosome abnormality on their problem list.  ----------------------------------------------------------------------------------- Patient reports no complaints.   Contractions: Not present. Vag. Bleeding: None.  Movement: Absent. Denies leaking of fluid.  ----------------------------------------------------------------------------------- The following portions of the patient's history were reviewed and updated as appropriate: allergies, current medications, past family history, past medical history, past social history, past surgical history and problem list. Problem list updated.   Objective  Blood pressure 132/80, weight (!) 345 lb (156.5 kg), last menstrual period 10/18/2019. Pregravid weight 350 lb (158.8 kg) Total Weight Gain -5 lb (-2.268 kg) Urinalysis:      Fetal Status: Fetal Heart Rate (bpm): 150   Movement: Absent     General:  Alert, oriented and cooperative. Patient is in no acute distress.  Skin: Skin is warm and dry. No rash noted.   Cardiovascular: Normal heart rate noted  Respiratory: Normal respiratory effort, no problems with  respiration noted  Abdomen: Soft, gravid, appropriate for gestational age. Pain/Pressure: Absent     Pelvic:  Cervical exam deferred        Extremities: Normal range of motion.     ental Status: Normal mood and affect. Normal behavior. Normal judgment and thought content.   US OB Comp Less 14 Wks  Result Date: 01/07/2020 Patient Name: Yvonne Rios DOB: 1990/08/15 MRN: [id number] ULTRASOUND REPORT Location: [facility] Date of Service: 01/07/2020 Indications:dating Findings: Mason Jim intrauterine pregnancy is visualized with a CRL consistent with [redacted]w[redacted]d gestation, giving an (U/S) EDD of 07/22/2020. The (U/S) EDD is consistent with the clinically established EDD of 07/24/2020. FHR: 160 BPM CRL measurement: 52.4 mm Yolk sac visualization is suboptimal. Amnion: visualized and appears normal Right Ovary is normal in appearance. Left Ovary is normal appearance. Corpus luteal cyst:  Right ovary Survey of the adnexa demonstrates no adnexal masses. There is no free peritoneal fluid in the cul de sac. Impression: 1. [redacted]w[redacted]d Viable Singleton Intrauterine pregnancy by U/S. 2. (U/S) EDD is consistent with Clinically established EDD of 07/24/2020, [redacted]w[redacted]d. Recommendations: 1.Clinical correlation with the patient's History and Physical Exam. Deanna Artis, RT There is a viable singleton gestation.  The fetal biometry correlates with established dating. Detailed evaluation of the fetal anatomy is precluded by early gestational age.  It must be noted that a normal ultrasound particular at this early gestational age is unable to rule out fetal aneuploidy, risk of first trimester miscarriage, or anatomic birth defects. Vena [place], MD, [facility], [facility] 01/07/2020, 5:21 PM   Date Fasting Breakfast Lunch Dinner  8/4 137 163 181 177  8/5 187 152 222   8/6 133 112 209 209  8/7 193 174 214   8/8 193 127 207 187    Assessment   29 y.o. 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    1) DM II - BG remain elevated increase Humulin R from 5U AC TID to 10U AC TID - Increase Lantus from 24U qHS to 36U qHS - continue weekly follow up - anesthesia consult - 20-24 week fetal echo  2) Genetics - Genetic screening blood work obtained today  Return in about 1 week (around 01/21/2020) for ROB any MD.  Vena [place], MD, [facility], [facility] 01/17/2020, 12:58 PM

## 2020-01-19 LAB — MATERNIT 21 PLUS CORE, BLOOD
Fetal Fraction: 5
Result (T21): NEGATIVE
Trisomy 13 (Patau syndrome): NEGATIVE
Trisomy 18 (Edwards syndrome): NEGATIVE
Trisomy 21 (Down syndrome): NEGATIVE

## 2020-01-21 ENCOUNTER — Telehealth: Payer: Self-pay

## 2020-01-21 NOTE — Telephone Encounter (Signed)
Chrissy w/[facility] calling to report ingredients of patients allergy shots: pollen, dust mites, cock roach, dog dander, saline, glycerin (preservatives). Usually if patient is already on it they are ok to stay on it. Patient husband is wanted Dr. Bonney Aid to review and let patient know if she can continue or should discontinue during pregnancy.

## 2020-01-22 NOTE — Telephone Encounter (Signed)
Ok to continue

## 2020-01-22 NOTE — Telephone Encounter (Signed)
LMVM TRC. 

## 2020-01-24 LAB — INHERITEST CORE(CF97,SMA,FRAX)

## 2020-01-25 ENCOUNTER — Ambulatory Visit (INDEPENDENT_AMBULATORY_CARE_PROVIDER_SITE_OTHER): Payer: Medicaid Other | Admitting: Obstetrics and Gynecology

## 2020-01-25 ENCOUNTER — Other Ambulatory Visit: Payer: Self-pay

## 2020-01-25 VITALS — BP 120/60 | Wt 352.0 lb

## 2020-01-25 DIAGNOSIS — Q999 Chromosomal abnormality, unspecified: Secondary | ICD-10-CM

## 2020-01-25 DIAGNOSIS — O99512 Diseases of the respiratory system complicating pregnancy, second trimester: Secondary | ICD-10-CM

## 2020-01-25 DIAGNOSIS — O0990 Supervision of high risk pregnancy, unspecified, unspecified trimester: Secondary | ICD-10-CM

## 2020-01-25 DIAGNOSIS — O24119 Pre-existing diabetes mellitus, type 2, in pregnancy, unspecified trimester: Secondary | ICD-10-CM

## 2020-01-25 DIAGNOSIS — O24112 Pre-existing diabetes mellitus, type 2, in pregnancy, second trimester: Secondary | ICD-10-CM

## 2020-01-25 DIAGNOSIS — Z3A14 14 weeks gestation of pregnancy: Secondary | ICD-10-CM

## 2020-01-25 DIAGNOSIS — O0992 Supervision of high risk pregnancy, unspecified, second trimester: Secondary | ICD-10-CM

## 2020-01-25 DIAGNOSIS — J45909 Unspecified asthma, uncomplicated: Secondary | ICD-10-CM

## 2020-01-25 DIAGNOSIS — O99519 Diseases of the respiratory system complicating pregnancy, unspecified trimester: Secondary | ICD-10-CM

## 2020-01-25 MED ORDER — HUMULIN R U-500 KWIKPEN 500 UNIT/ML ~~LOC~~ SOPN: 10.0000 [IU] | PEN_INJECTOR | Freq: Three times a day (TID) | SUBCUTANEOUS | 6 refills | Status: DC

## 2020-01-25 MED ORDER — CYCLOBENZAPRINE HCL 10 MG PO TABS: 10.0000 mg | ORAL_TABLET | Freq: Three times a day (TID) | ORAL | 2 refills | Status: DC | PRN

## 2020-01-25 MED ORDER — FOLIC ACID 1 MG PO TABS: 1.0000 mg | ORAL_TABLET | Freq: Every day | ORAL | 10 refills | Status: AC

## 2020-01-25 MED ORDER — INSULIN GLARGINE 100 UNIT/ML SOLOSTAR PEN: 46.0000 [IU] | PEN_INJECTOR | Freq: Every day | SUBCUTANEOUS | 11 refills | Status: DC

## 2020-01-25 MED ORDER — ASPIRIN EC 81 MG PO TBEC
81.0000 mg | DELAYED_RELEASE_TABLET | Freq: Every day | ORAL | 2 refills | Status: DC
Start: 2020-01-25 — End: 2020-07-08

## 2020-01-25 NOTE — Telephone Encounter (Signed)
Pt aware.

## 2020-01-25 NOTE — Progress Notes (Signed)
Routine Prenatal Care Visit  Subjective  Yvonne Rios is a 29 y.o. G4P1021 at [redacted]w[redacted]d being seen today for ongoing prenatal care.  She is currently monitored for the following issues for this high-risk pregnancy and has Anxiety; Asthma; Depression; Environmental allergies; Functional constipation; GERD (gastroesophageal reflux disease); Insomnia; Left-sided low back pain with sciatica; Migraine without status migrainosus, not intractable; Mild intermittent asthma without complication; Morbid obesity due to excess calories (HCC); Myopia, left eye; Type 2 diabetes mellitus without complication, without long-term current use of insulin (HCC); Pre-existing type 2 diabetes mellitus during pregnancy, antepartum; Maternal obesity, antepartum; Supervision of high risk pregnancy, antepartum; First trimester bleeding; Asthma during pregnancy; and Chromosome abnormality on their problem list.  ----------------------------------------------------------------------------------- Patient reports no complaints.   Contractions: Not present. Vag. Bleeding: None.  Movement: Absent. Denies leaking of fluid.  ----------------------------------------------------------------------------------- The following portions of the patient's history were reviewed and updated as appropriate: allergies, current medications, past family history, past medical history, past social history, past surgical history and problem list. Problem list updated.   Objective  Blood pressure 120/60, weight (!) 352 lb (159.7 kg), last menstrual period 10/18/2019. Pregravid weight 350 lb (158.8 kg) Total Weight Gain 2 lb (0.907 kg) Urinalysis:      Fetal Status: Fetal Heart Rate (bpm): 150   Movement: Absent     General:  Alert, oriented and cooperative. Patient is in no acute distress.  Skin: Skin is warm and dry. No rash noted.   Cardiovascular: Normal heart rate noted  Respiratory: Normal respiratory effort, no problems with  respiration noted  Abdomen: Soft, gravid, appropriate for gestational age. Pain/Pressure: Absent     Pelvic:  Cervical exam deferred        Extremities: Normal range of motion.     ental Status: Normal mood and affect. Normal behavior. Normal judgment and thought content.     Assessment   29 y.o. D2K0254 at [redacted]w[redacted]d by  07/24/2020, by Last Menstrual Period presenting for routine prenatal visit  Plan   FOURTH Problems (from 12/28/19 to present)    Problem Noted Resolved   Supervision of high risk pregnancy, antepartum 12/31/2019 by Vena Austria, MD No   Overview Addendum 01/25/2020 12:53 PM by Vena Austria, MD    [facility]inic [facility] Prenatal Labs  Dating LMP = 6week [place] Blood type: O/Positive/-- (07/23 1457)   Genetic Screen NIPS: normal XY Fragile X neg, CF neg, SMA neg Antibody:Negative (07/23 1457)  Anatomic [place]  Rubella: 16.40 (07/23 1457) Varicella: Immune  GTT N/A DMII, baseline HgbA1C 9.4 RPR: Non Reactive (07/23 1457)   Rhogam N/A HBsAg: Negative (07/23 1457)   TDaP vaccine                       Flu Shot: HIV: Non Reactive (07/23 1457)   Baby Food                                GBS:   Contraception  Pap: 12/28/2019 NILM  CBB     CS/VBAC N/A   Support Person Husband [name]           Previous Version   First trimester bleeding 12/31/2019 by Vena Austria, MD No   Asthma during pregnancy 12/31/2019 by Vena Austria, MD No   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 by Vena Austria, MD No   Overview Addendum 12/31/2019 10:53 PM by Vena Austria, MD    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    - Increase Lantus from 36 units to 46 units, keep Humulin at 10 units.  BG not close to goal 110-220 - lifestyles referral - MFM referral - Fetal echo at 20-24 weeks - Discussed ASA  Return in about 1 week (around 02/01/2020) for ROB and follow up MD only.  Vena Austria, MD, [facility], [facility] 01/25/2020, 5:03 PM

## 2020-01-25 NOTE — Progress Notes (Signed)
ROB- no concerns 

## 2020-01-31 ENCOUNTER — Ambulatory Visit: Payer: No Typology Code available for payment source

## 2020-02-04 ENCOUNTER — Encounter: Payer: Medicaid Other | Admitting: Obstetrics and Gynecology

## 2020-02-04 NOTE — Addendum Note (Signed)
Addended by: Analyah Mcconnon M on: 02/04/2020 01:37 PM   Modules accepted: Level of Service  

## 2020-02-05 ENCOUNTER — Ambulatory Visit: Payer: Medicaid Other | Admitting: *Deleted

## 2020-02-06 ENCOUNTER — Encounter: Payer: Self-pay | Admitting: *Deleted

## 2020-02-06 ENCOUNTER — Other Ambulatory Visit: Payer: Self-pay

## 2020-02-06 DIAGNOSIS — O26892 Other specified pregnancy related conditions, second trimester: Secondary | ICD-10-CM | POA: Insufficient documentation

## 2020-02-06 DIAGNOSIS — Z3A16 16 weeks gestation of pregnancy: Secondary | ICD-10-CM | POA: Diagnosis not present

## 2020-02-06 DIAGNOSIS — R1030 Lower abdominal pain, unspecified: Secondary | ICD-10-CM | POA: Diagnosis not present

## 2020-02-06 DIAGNOSIS — Z5321 Procedure and treatment not carried out due to patient leaving prior to being seen by health care provider: Secondary | ICD-10-CM | POA: Diagnosis not present

## 2020-02-06 LAB — COMPREHENSIVE METABOLIC PANEL
ALT: 13 U/L (ref 0–44)
AST: 18 U/L (ref 15–41)
Albumin: 3.1 g/dL — ABNORMAL LOW (ref 3.5–5.0)
Alkaline Phosphatase: 57 U/L (ref 38–126)
Anion gap: 9 (ref 5–15)
BUN: <5 mg/dL — ABNORMAL LOW (ref 6–20)
CO2: 22 mmol/L (ref 22–32)
Calcium: 8.8 mg/dL — ABNORMAL LOW (ref 8.9–10.3)
Chloride: 104 mmol/L (ref 98–111)
Creatinine, Ser: 0.47 mg/dL (ref 0.44–1.00)
GFR calc Af Amer: >60 mL/min (ref >60)
GFR calc non Af Amer: >60 mL/min (ref >60)
Glucose, Bld: 201 mg/dL — ABNORMAL HIGH (ref 70–99)
Potassium: 3.7 mmol/L (ref 3.5–5.1)
Sodium: 135 mmol/L (ref 135–145)
Total Bilirubin: 0.5 mg/dL (ref 0.3–1.2)
Total Protein: 6.6 g/dL (ref 6.5–8.1)

## 2020-02-06 LAB — URINALYSIS, COMPLETE (UACMP) WITH MICROSCOPIC
Bilirubin Urine: NEGATIVE
Glucose, UA: >=500 mg/dL — AB
Ketones, ur: NEGATIVE mg/dL
Nitrite: NEGATIVE
Protein, ur: NEGATIVE mg/dL
Specific Gravity, Urine: 1.008 (ref 1.005–1.030)
pH: 6 (ref 5.0–8.0)

## 2020-02-06 LAB — CBC
HCT: 34.9 % — ABNORMAL LOW (ref 36.0–46.0)
Hemoglobin: 11.3 g/dL — ABNORMAL LOW (ref 12.0–15.0)
MCH: 26.9 pg (ref 26.0–34.0)
MCHC: 32.4 g/dL (ref 30.0–36.0)
MCV: 83.1 fL (ref 80.0–100.0)
Platelets: 248 10*3/uL (ref 150–400)
RBC: 4.2 MIL/uL (ref 3.87–5.11)
RDW: 15.5 % (ref 11.5–15.5)
WBC: 11.4 10*3/uL — ABNORMAL HIGH (ref 4.0–10.5)
nRBC: 0 % (ref 0.0–0.2)

## 2020-02-06 LAB — LIPASE, BLOOD: Lipase: 21 U/L (ref 11–51)

## 2020-02-06 NOTE — ED Triage Notes (Signed)
Pt is approx [redacted] weeks pregnant.  Pt has lower abd pain and pressure.  Sx began last night. No vag bleeding  No urinary sx.  Pt alert.

## 2020-02-07 ENCOUNTER — Institutional Professional Consult (permissible substitution): Payer: Medicaid Other

## 2020-02-07 ENCOUNTER — Emergency Department: Payer: Medicaid Other

## 2020-02-07 ENCOUNTER — Emergency Department
Admission: EM | Admit: 2020-02-07 | Discharge: 2020-02-07 | Disposition: A | Discharge status: Home / Self Care | Payer: Medicaid Other | Attending: Emergency Medicine | Admitting: Emergency Medicine

## 2020-02-07 DIAGNOSIS — R1030 Lower abdominal pain, unspecified: Secondary | ICD-10-CM

## 2020-02-08 ENCOUNTER — Other Ambulatory Visit: Payer: Self-pay | Admitting: Obstetrics and Gynecology

## 2020-02-08 ENCOUNTER — Telehealth: Payer: Self-pay

## 2020-02-08 MED ORDER — HUMULIN R U-500 KWIKPEN 500 UNIT/ML ~~LOC~~ SOPN
10.0000 [IU] | PEN_INJECTOR | Freq: Three times a day (TID) | SUBCUTANEOUS | 6 refills | Status: DC
Start: 2020-02-08 — End: 2020-04-04

## 2020-02-08 NOTE — Telephone Encounter (Signed)
Pharmacy has sent over a refill request for Humulin-R.

## 2020-02-12 ENCOUNTER — Other Ambulatory Visit: Payer: Self-pay

## 2020-02-12 ENCOUNTER — Ambulatory Visit (INDEPENDENT_AMBULATORY_CARE_PROVIDER_SITE_OTHER): Payer: Medicaid Other | Admitting: Obstetrics and Gynecology

## 2020-02-12 VITALS — BP 120/78 | Wt 354.0 lb

## 2020-02-12 DIAGNOSIS — Z3A17 17 weeks gestation of pregnancy: Secondary | ICD-10-CM

## 2020-02-12 DIAGNOSIS — Q999 Chromosomal abnormality, unspecified: Secondary | ICD-10-CM

## 2020-02-12 DIAGNOSIS — O24119 Pre-existing diabetes mellitus, type 2, in pregnancy, unspecified trimester: Secondary | ICD-10-CM

## 2020-02-12 DIAGNOSIS — Z363 Encounter for antenatal screening for malformations: Secondary | ICD-10-CM

## 2020-02-12 DIAGNOSIS — O99210 Obesity complicating pregnancy, unspecified trimester: Secondary | ICD-10-CM

## 2020-02-12 DIAGNOSIS — O0990 Supervision of high risk pregnancy, unspecified, unspecified trimester: Secondary | ICD-10-CM

## 2020-02-12 MED ORDER — INSULIN GLARGINE 100 UNIT/ML SOLOSTAR PEN
50.0000 [IU] | PEN_INJECTOR | Freq: Every day | SUBCUTANEOUS | 11 refills | Status: DC
Start: 2020-02-12 — End: 2020-04-04

## 2020-02-12 NOTE — Progress Notes (Signed)
Routine Prenatal Care Visit  Subjective  Yvonne Rios is a 29 y.o. G4P1021 at [redacted]w[redacted]d being seen today for ongoing prenatal care.  She is currently monitored for the following issues for this high-risk pregnancy and has Anxiety; Asthma; Depression; Environmental allergies; Functional constipation; GERD (gastroesophageal reflux disease); Insomnia; Left-sided low back pain with sciatica; Migraine without status migrainosus, not intractable; Mild intermittent asthma without complication; Morbid obesity due to excess calories (HCC); Myopia, left eye; Type 2 diabetes mellitus without complication, without long-term current use of insulin (HCC); Pre-existing type 2 diabetes mellitus during pregnancy, antepartum; Maternal obesity, antepartum; Supervision of high risk pregnancy, antepartum; First trimester bleeding; Asthma during pregnancy; and Chromosome abnormality on their problem list.  ----------------------------------------------------------------------------------- Patient reports no complaints.   Contractions: Not present. Vag. Bleeding: None.  Movement: Absent. Denies leaking of fluid.  ----------------------------------------------------------------------------------- The following portions of the patient's history were reviewed and updated as appropriate: allergies, current medications, past family history, past medical history, past social history, past surgical history and problem list. Problem list updated.   Objective  Blood pressure 120/78, weight (!) 354 lb (160.6 kg), last menstrual period 10/18/2019. Pregravid weight 350 lb (158.8 kg) Total Weight Gain 4 lb (1.814 kg) Urinalysis:      Fetal Status: Fetal Heart Rate (bpm): 145   Movement: Absent     General:  Alert, oriented and cooperative. Patient is in no acute distress.  Skin: Skin is warm and dry. No rash noted.   Cardiovascular: Normal heart rate noted  Respiratory: Normal respiratory effort, no problems with  respiration noted  Abdomen: Soft, gravid, appropriate for gestational age. Pain/Pressure: Absent     Pelvic:  Cervical exam deferred        Extremities: Normal range of motion.     ental Status: Normal mood and affect. Normal behavior. Normal judgment and thought content.     Assessment   29 y.o. T5T7322 at [redacted]w[redacted]d by  07/22/2020, Alternate EDD Entry presenting for routine prenatal visit  Plan   FOURTH Problems (from 12/28/19 to present)    Problem Noted Resolved   Supervision of high risk pregnancy, antepartum 12/31/2019 by Vena Austria, MD No   Overview Addendum 01/25/2020 12:53 PM by Vena Austria, MD    [facility] Prenatal Labs  Dating LMP = 6week [place] Blood type: O/Positive/-- (07/23 1457)   Genetic Screen NIPS: normal XY Fragile X neg, CF neg, SMA neg Antibody:Negative (07/23 1457)  Anatomic [place]  Rubella: 16.40 (07/23 1457) Varicella: Immune  GTT N/A DMII, baseline HgbA1C 9.4 RPR: Non Reactive (07/23 1457)   Rhogam N/A HBsAg: Negative (07/23 1457)   TDaP vaccine                       Flu Shot: HIV: Non Reactive (07/23 1457)   Baby Food                                GBS:   Contraception  Pap: 12/28/2019 NILM  CBB     CS/VBAC N/A   Support Person Husband [name]           Previous Version   First trimester bleeding 12/31/2019 by Vena Austria, MD No   Asthma during pregnancy 12/31/2019 by Vena Austria, MD No   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 by Vena Austria, MD No   Overview Addendum 12/31/2019 10:53 PM by Vena Austria, MD    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 A2/B GDM)  For A2/B GDM or higher classes of DM [X]  Diabetes Education  and Testing Supplies [X]  Nutrition Counsult [ ]  Fetal ECHO after 22-24 weeks  [ ]  Eye exam for retina evaluation  [ ]  Baseline EKG [ ]  fetal growth every 4 weeks starting at 28 weeks [ ]  Twice weekly NST starting at [redacted] weeks gestation [ ]  Delivery planning contingent on fetal growth, AFI, glycemic control, and other co-morbidities but at least by 39 weeks  Baseline and surveillance labs (pulled in from [facility], refresh links as needed)  Lab Results  Component Value Date   CREATININE 0.53 (L) 12/28/2019   AST 14 12/28/2019   ALT 14 12/28/2019   TSH 1.390 09/20/2016   Lab Results  Component Value Date   HGBA1C 9.4 (H) 12/28/2019   HGBA1C 6.6 (H) 09/20/2016    Antenatal Testing Class of DM U/S NST/AFI DELIVERY  Diabetes   A1 - good control - O24.410    A2 - good control - O24.419      A2  - poor control or poor compliance - O24.419, E11.65   (Macrosomia or polyhydramnios) **E11.65 is extra code for poor control**    A2/B - O24.919  and B-C O24.319  Poor control B-C or D-R-F-T - O24.319  or  Type I DM - O24.019  20-38  20-38  20-24-28-32-36   20-24-28-32-35-38//fetal echo  20-24-27-30-33-36-38//fetal echo  40  32//2 x wk  32//2 x wk   32//2 x wk  28//BPP wkly then 32//2 x wk  40  39  PRN   39  PRN          Maternal obesity, antepartum 12/28/2019 by 12/30/2019, MD No       Gestational age appropriate obstetric precautions including but not limited to vaginal bleeding, contractions, leaking of fluid and fetal movement were reviewed in detail with the patient.    1) DM II - BG remain elevated - Continue Lantus increase to 50 units, keep Humulin at 10 units with meals.  Also add correction coverage she is on Humulin-N 500 so 5 units for BG over 150 - MFM referral pending - fetal echo 20-24 weeks  - continue weekly follow up  Return in about 1 week (around 02/19/2020) for ROB and anatomy scan.  07-07-1983, MD, 03-08-1971 [facility], [facility] 02/12/2020, 4:28 PM

## 2020-02-12 NOTE — Patient Instructions (Signed)
Lantus 50 Units at bedtime Humulin 10 units with meals  For post-meal values above 150 add additional 5 units of Humulin

## 2020-02-12 NOTE — Progress Notes (Signed)
ROB Refill on Humulin

## 2020-02-14 ENCOUNTER — Telehealth: Payer: Self-pay

## 2020-02-14 NOTE — Telephone Encounter (Signed)
Renae from [facility] called for clarification on pt's Lantus.  Information given.

## 2020-02-18 ENCOUNTER — Institutional Professional Consult (permissible substitution): Payer: Medicaid Other

## 2020-02-21 ENCOUNTER — Encounter: Payer: Medicaid Other | Admitting: Obstetrics and Gynecology

## 2020-02-21 ENCOUNTER — Ambulatory Visit: Payer: Medicaid Other

## 2020-02-22 ENCOUNTER — Ambulatory Visit: Payer: Medicaid Other | Admitting: *Deleted

## 2020-02-25 ENCOUNTER — Ambulatory Visit: Payer: Medicaid Other | Attending: Obstetrics | Admitting: Maternal & Fetal Medicine

## 2020-02-25 ENCOUNTER — Other Ambulatory Visit: Payer: Self-pay

## 2020-02-25 ENCOUNTER — Ambulatory Visit (HOSPITAL_BASED_OUTPATIENT_CLINIC_OR_DEPARTMENT_OTHER): Payer: Medicaid Other

## 2020-02-25 DIAGNOSIS — Z3A18 18 weeks gestation of pregnancy: Secondary | ICD-10-CM | POA: Diagnosis not present

## 2020-02-25 DIAGNOSIS — J45909 Unspecified asthma, uncomplicated: Secondary | ICD-10-CM | POA: Diagnosis not present

## 2020-02-25 DIAGNOSIS — O24112 Pre-existing diabetes mellitus, type 2, in pregnancy, second trimester: Secondary | ICD-10-CM

## 2020-02-25 DIAGNOSIS — O285 Abnormal chromosomal and genetic finding on antenatal screening of mother: Secondary | ICD-10-CM

## 2020-02-25 DIAGNOSIS — O99212 Obesity complicating pregnancy, second trimester: Secondary | ICD-10-CM | POA: Diagnosis not present

## 2020-02-25 DIAGNOSIS — O99519 Diseases of the respiratory system complicating pregnancy, unspecified trimester: Secondary | ICD-10-CM | POA: Diagnosis not present

## 2020-02-25 DIAGNOSIS — O99342 Other mental disorders complicating pregnancy, second trimester: Secondary | ICD-10-CM

## 2020-02-25 DIAGNOSIS — Q999 Chromosomal abnormality, unspecified: Secondary | ICD-10-CM

## 2020-02-25 DIAGNOSIS — F419 Anxiety disorder, unspecified: Secondary | ICD-10-CM

## 2020-02-25 NOTE — Progress Notes (Signed)
MFM Consult  Yvonne Rios is a 29 year old gravida 4 para 1-0-2-1 with an EDC of July 22, 2020 currently at 18 weeks and 6 days.  She was seen in consultation today due to pregestational (type 2 diabetes) in pregnancy.  The patient reports that she was diagnosed with gestational diabetes during her last pregnancy about 7 years ago.  She was confirmed to have type 2 diabetes about 2 years ago.  She is currently being treated with Humulin insulin 10 units before each meal and Lantus insulin 50 mg at bedtime.  Her hemoglobin A1c drawn 1 month ago was 9.4%.  The patient reports that her fingerstick values are now lower since she was started on her current insulin regimen.  She had a cell free DNA test drawn earlier in her pregnancy which indicated a low risk for trisomy 61, 73, and 13.  A female fetus is predicted.  Her blood work drawn earlier in her pregnancy showed normal platelets, normal renal function tests, and normal liver function tests.  A P/C ratio did not indicate significant proteinuria.  Other than type 2 diabetes, the patient's pregnancy has also been complicated by morbid obesity, history of asthma, anxiety, depression, and ADHD.    Her current medications include Humulin and Lantus insulin, prenatal vitamins, and multiple other medications for treatment of anxiety, depression, and ADHD.  Her obstetrical history includes a normal spontaneous vaginal delivery at 38 weeks of a female infant weighing 6 pounds 14 ounces.  Her child was found to have a 15 q. deletion in his chromosomes after delivery.  She is meeting with our genetic counselor later today to discuss the significance of this history.  The implications and management of diabetes in pregnancy was discussed in detail with the patient.  She was advised to continue to monitor her fingersticks 4 times daily (fasting and 2 hours after each meal).  She was advised that our goals for her fingerstick values are fasting values of 90-95  or less and two-hour postprandial values of 120 or less.  Should her fingerstick results be above these values, her insulin regimen may have to be adjusted to help her achieve better glycemic control. The patient was advised that getting her fingerstick values as close to these goals as possible would provide her with the most optimal obstetrical outcome.  The increased risks of fetal congenital malformations such as heart defects and other abnormalities associated with diabetes was discussed. The patient reports that she already has a detailed fetal anatomy scan scheduled on September 28 (next week in your office) to screen for fetal anomalies in your office.  She will also have a fetal echocardiogram scheduled with [facility] pediatric cardiology at between 22 to 24 weeks.    The increased risk of polyhydramnios, fetal macrosomia, and preeclampsia associated with diabetes was also discussed.  Due to this indication, she should continue to be followed with serial (monthly) growth ultrasounds following her fetal anatomy scan.  Once to twice weekly fetal testing using either nonstress tests or biophysical profiles should be started at around 32 weeks.  The patient was advised that delivery for well-controlled diabetes in pregnancy is usually recommended at around 39 weeks.  Delivery at 37 weeks may be considered should her glycemic control be poor.  The patient was advised that a cesarean delivery may be recommended should the estimated fetal weight later in her pregnancy be 4500 g or greater in order to avoid the risk of shoulder dystocia.  As the patient already has  ultrasound exams scheduled in your office, no ultrasound exams were scheduled in our office.  We would be happy to see her again in the future if necessary.  At the end of the consultation, the patient and her partner stated that all their questions had been answered to their complete satisfaction.    Thank you for referring this patient for a  Maternal-Fetal Medicine consultation.  Total time spent in consultation 45 minutes.

## 2020-02-25 NOTE — Progress Notes (Addendum)
Referring Provider:  Domingo Pulse Length of Consultation: 15 minutes  Yvonne Rios was referred to Maternal Fetal Care at [facility] for genetic counseling because of a family and personal history of a chromosome 15q13.3 deletion. This note summarizes the information we discussed.  See also the MFM consultation note from Dr. Parke Poisson today regarding diabetic management.   We first reviewed the medical history and pregnancy history.  The patient stated that she has no concerns regarding the pregnancy at this time.  In the family, she reported that her son, Samyukta Cura (dob 08/14/2012), was diagnosed with a chromosome 15q13.3 deletion when he was evaluated for developmental delays at [facility].  Verbal consent was provided by the patient to review her son's records. At that time, the patient also had genetic testing and Ms. Oviedo was found to have the same chromosome deletion.  Of note, the patient also reported a maternal half brother with autistic features who has not had any testing. The note below is a portion of the genetic counseling visit from [facility] on 11/26/2014 with Jeri Modena and Ms. Guyett:  "Genetic Counseling was provided in very simple terms for the mother, who has had considerable difficulty understanding the concepts of heredity. In order to review the results of previous genetic testing listed below,we discussed that, each cell in the body has a set of 46 chromosomes that come in 23 pairs. The first 22 pairs of chromosomes do not decide gender and are called autosomes, while the last pair decides gender (XX for a female and XY for a female). On each chromosome are thousands of genes or the individual directions that code for the proteins that are important to the growth, development, and maintenance of functions within the body.  As the genes are on the chromosomes, they come in pairs, one of each from the mother and one from the father. Genes are the instructions that tell  our bodies how to grow and develop. Some changes in the genes can cause problems in health, growth, or development.  The results of Jeremiah's mother's FISH studies were explained in detail, as follows: Lindyn's testing showed that she has a missing piece of genetic material on the long arm of one chromosome 15 within band 13 that is approximately 1.4Mb in size (1,400,000 bases). An ideogram detailing the location of this abnormality was given to the parents for illustration. FISH testing confirmed this deletion.   Analysis of the deletion shows that it involves 9 genes ([address], FAN1, MTMR10, TRPM1, MIR211, [id number], KLF13, OTUD7A, CHRNA7 (deletion possibly includes: YKZ9D3, [id number], [id number], GOLGA80, WHAMMP1, [id number] and [id number]).  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 Features are variable and can include:  Distinctive facial features  Intellectual disability (mild to moderate)  This is variable, some people with the deletion have normal intelligence  Epilepsy and/or abnormal EEG  Risk for bipolar disorder and schizophrenia  Risk for autism  Congenital malformations including heart defects are rarely associated, but have been reported  Some people have no clinical symptoms  We counseled [place] that this deletion was passed from her--in the egg cell from which Jeri Modena was conceived. We reiterated that there is nothing that parents can do to control or cause a gene or chromosome change. Florice's deletion may be an inherited genetic change or de novo.   Each time [place] has pregnancy, there is a 50% chance that each future child will inherit this chromosome change. We counseled parents that we cannot predict  a phenotype in any future child(ren), even if they are identified to have the chromosome deletion. Parents were encouraged to meet with the reproductive genetic counselors to discuss their options further. The  couple understood the statistical concept of 1 chance in 2, like flipping a coin and are very concerned about the possibility of having another child with similar problems  Other maternal family members may be at risk, and we offered to help facilitate testing, specifically Anabelen's brother who has autism and her mother who has mental illness.  We reminded the parents that Jeri Modena also has a 1/2 or 50% chance of passing on this deletion to each of his future children. Therefore, we would recommend that he meet with a genetic counselor prior to starting a family of his own to discuss available testing options and receive updated information.   We gave the parents a genetic counseling packet including an ideogram of the deletion, the "15q13.3 microdeletion syndrome" packet from [employer] (www.rarechromo.org) as well as the [facility] Reference article on 15q13.3 deletions.   The following recommendations for [place] were provided in writing to the couple:  That she share this diagnosis with her doctor  Echocardiogram if heart defect is suspected  EEG if seizures are suspected"  We reiterated that the findings above confirm that this pregnancy is at 50% risk for inheriting the gene change present in Ms. Aurea Graff and [place].  The features of this condition can be highly variable, with some individuals showing no developmental differences and others showing multiple features.  Testing of the current pregnancy (now at [redacted] weeks gestation) is available through a test called amniocentesis, which a thin needle is inserted into the amniotic sac to remove a small sample of fluid.  In this fluid are skin cells which can be grown in culture and examined for the known deletion in the family.  This testing has a risk of less than 1 in 200 for complications which can lead to miscarriage.  Ms. Mulhearn does NOT desire amniocentesis  for this pregnancy.  Because most people with chromosome 15q13.3 deletions do  not have physical birth differences, we do not expect to be able to detect this condition through ultrasound.  We did recommend a fetal echocardiogram to assess for structural heart defects, as some babies with 15q13.3 deletions have been diagnosed with structural heart defects.  The patient plans to speak with her pediatrician after the baby is born about possible evaluation at [facility].  As a routine means of screening in pregnancy, the patient already had testing for aneuploidies with cell free fetal DNA testing which was normal.  She also had carrier screening for CF, SMA and Fragile X syndrome which were negative.  We encouraged Ms. Janus to contact us at [phone] if she has any questions or concerns.  Cherly Anderson, MS, CGC

## 2020-02-29 ENCOUNTER — Encounter: Payer: Self-pay | Admitting: *Deleted

## 2020-02-29 ENCOUNTER — Encounter: Payer: Medicaid Other | Attending: Obstetrics and Gynecology | Admitting: *Deleted

## 2020-02-29 ENCOUNTER — Other Ambulatory Visit: Payer: Self-pay

## 2020-02-29 VITALS — BP 104/64 | Ht 68.0 in | Wt 352.8 lb

## 2020-02-29 DIAGNOSIS — O0990 Supervision of high risk pregnancy, unspecified, unspecified trimester: Secondary | ICD-10-CM | POA: Diagnosis not present

## 2020-02-29 DIAGNOSIS — E118 Type 2 diabetes mellitus with unspecified complications: Secondary | ICD-10-CM | POA: Insufficient documentation

## 2020-02-29 DIAGNOSIS — O24119 Pre-existing diabetes mellitus, type 2, in pregnancy, unspecified trimester: Secondary | ICD-10-CM | POA: Diagnosis present

## 2020-02-29 DIAGNOSIS — Z3A14 14 weeks gestation of pregnancy: Secondary | ICD-10-CM | POA: Diagnosis not present

## 2020-02-29 NOTE — Progress Notes (Signed)
Diabetes Self-Management Education  Visit Type: First/Initial  Appt. Start Time: 1110 Appt. End Time: 1220  02/29/2020  [name] identified by name and date of birth, is a 29 y.o. female with a diagnosis of Diabetes: Type 2 (Pregnant).   ASSESSMENT  Blood pressure 104/64, height 5\' 8"  (1.727 m), weight (!) 352 lb 12.8 oz (160 kg), last menstrual period 10/18/2019, estimated date of delivery 07/22/2020 Body mass index is 53.64 kg/m.   Diabetes Self-Management Education - 02/29/20 1241      Visit Information   Visit Type First/Initial      Initial Visit   Diabetes Type Type 2   Pregnant   Are you currently following a meal plan? No    Are you taking your medications as prescribed? Yes    Date Diagnosed 3 years ago      Health Coping   How would you rate your overall health? Fair      Psychosocial Assessment   Patient Belief/Attitude about Diabetes Defeat/Burnout   "tired"   Self-care barriers None    Self-management support Doctor's office;Family    Other persons present Spouse/SO    Patient Concerns Nutrition/Meal planning;Glycemic Control;Weight Control;Monitoring;Healthy Lifestyle    Special Needs None    Preferred Learning Style Auditory    Learning Readiness Contemplating    How often do you need to have someone help you when you read instructions, pamphlets, or other written materials from your doctor or pharmacy? 1 - Never    What is the last grade level you completed in school? GED      Pre-Education Assessment   Patient understands the diabetes disease and treatment process. Needs Instruction    Patient understands incorporating nutritional management into lifestyle. Needs Instruction    Patient undertands incorporating physical activity into lifestyle. Needs Instruction    Patient understands using medications safely. Needs Review    Patient understands monitoring blood glucose, interpreting and using results Needs Review    Patient understands  prevention, detection, and treatment of acute complications. Needs Instruction    Patient understands prevention, detection, and treatment of chronic complications. Needs Instruction    Patient understands how to develop strategies to address psychosocial issues. Needs Instruction    Patient understands how to develop strategies to promote health/change behavior. Needs Instruction      Complications   Last HgB A1C per patient/outside source 9.4 %   12/28/2019   How often do you check your blood sugar? 3-4 times/day    Fasting Blood glucose range (mg/dL) 12/30/2019   Pt reports FBG's range from 95-102 mg/dL.   Postprandial Blood glucose range (mg/dL) 64-332   Pt reports readings before lunch are 150 mg/dL and before supper 951-884 mg/dL.   Have you had a dilated eye exam in the past 12 months? Yes    Have you had a dental exam in the past 12 months? No    Are you checking your feet? Yes    How many days per week are you checking your feet? 7      Dietary Intake   Breakfast usually skips - may eat pancakes 2 or biscuit and ham or cereal and milk    Snack (morning) 0-1 snack/day - chips and salsa, watermelon, apple, clementine    Lunch pizza, cheeseburger, occasional salad with lettuce, tomatoes, onions, cuccumbers    Dinner baked chicken, occasional pork chop; potatoes, beans, corn, pasta, green beans    Beverage(s) water, regular soda, Crystal light, Gatorade zero  Exercise   Exercise Type ADL's      Patient Education   Previous Diabetes Education Yes (please comment)    Disease state  Definition of diabetes, type 1 and 2, and the diagnosis of diabetes;Explored patient's options for treatment of their diabetes;Factors that contribute to the development of diabetes    Nutrition management  Role of diet in the treatment of diabetes and the relationship between the three main macronutrients and blood glucose level;Food label reading, portion sizes and measuring food.;Reviewed blood glucose  goals for pre and post meals and how to evaluate the patients' food intake on their blood glucose level.;Meal timing in regards to the patients' current diabetes medication.    Physical activity and exercise  Role of exercise on diabetes management, blood pressure control and cardiac health.    Medications Taught/reviewed insulin injection, site rotation, insulin storage and needle disposal.;Reviewed patients medication for diabetes, action, purpose, timing of dose and side effects.    Monitoring Purpose and frequency of SMBG.;Taught/discussed recording of test results and interpretation of SMBG.;Identified appropriate SMBG and/or A1C goals.;Ketone testing, when, how.    Acute complications Taught treatment of hypoglycemia - the 15 rule.    Chronic complications Relationship between chronic complications and blood glucose control    Psychosocial adjustment Identified and addressed patients feelings and concerns about diabetes    Preconception care Pregnancy and GDM  Role of pre-pregnancy blood glucose control on the development of the fetus;Reviewed with patient blood glucose goals with pregnancy;Role of family planning for patients with diabetes      Individualized Goals (developed by patient)   Reducing Risk Other (comment)   improve blood sugars, prevent diabetes complications, lose weight, lead a healthier lifestyle, become more fit     Outcomes   Expected Outcomes Demonstrated interest in learning. Expect positive outcomes    Future DMSE 2 wks           Individualized Plan for Diabetes Self-Management Training:   Learning Objective:  Patient will have a greater understanding of diabetes self-management. Patient education plan is to attend individual and/or group sessions per assessed needs and concerns.   Plan:  Read booklet on Diabetes Management for Mothers-To-Be Follow Gestational Meal Planning Guidelines Don't skip meals Avoid sugar sweetened drinks (soda) unless treating a low  blood sugar Always carry fast acting glucose and a snack Complete a 3 Day Food Record and bring to next appointment Check blood sugars 4 x day - before meals and at bedtime; sometimes check 2 hrs after every meal and record  Check with insurance and OB for possible use of CGM Bring blood sugar log to all appointments Rotate injection sites Purchase urine ketone strips if instructed by MD and check urine ketones every am:  If + increase bedtime snack to 1 protein and 2 carbohydrate servings Walk 20-30 minutes at least 5 x week if permitted by MD  Expected Outcomes:  Demonstrated interest in learning. Expect positive outcomes  Education material provided:  Diabetes Management for Mothers-To-Be Booklet Gestational Meal Planning Guidelines Simple Meal Plan 3 Day Food Record Goals for a Healthy Pregnancy Glucose tablets Symptoms, causes and treatments of Hypoglycemia  If problems or questions, patient to contact team via:  Sharion Settler, RN, CCM, CDCES [phone]  Future DSME appointment: 2 wks  March 13, 2020 with the dietitian

## 2020-02-29 NOTE — Patient Instructions (Signed)
Read booklet on Diabetes Management for Mothers-To-Be Follow Gestational Meal Planning Guidelines Don't skip meals Avoid sugar sweetened drinks (soda) unless treating a low blood sugar Always carry fast acting glucose and a snack Complete a 3 Day Food Record and bring to next appointment Check blood sugars 4 x day - before meals and at bedtime; sometimes check 2 hrs after every meal and record  Check with insurance and OB for possible use of CGM Bring blood sugar log to all appointments Rotate injection sites Purchase urine ketone strips if instructed by MD and check urine ketones every am:  If + increase bedtime snack to 1 protein and 2 carbohydrate servings Walk 20-30 minutes at least 5 x week if permitted by MD

## 2020-03-04 ENCOUNTER — Other Ambulatory Visit: Payer: Self-pay

## 2020-03-04 ENCOUNTER — Ambulatory Visit (INDEPENDENT_AMBULATORY_CARE_PROVIDER_SITE_OTHER): Payer: Medicaid Other

## 2020-03-04 DIAGNOSIS — O0990 Supervision of high risk pregnancy, unspecified, unspecified trimester: Secondary | ICD-10-CM | POA: Diagnosis not present

## 2020-03-04 DIAGNOSIS — Z363 Encounter for antenatal screening for malformations: Secondary | ICD-10-CM | POA: Diagnosis not present

## 2020-03-05 ENCOUNTER — Encounter: Payer: Medicaid Other | Admitting: Obstetrics and Gynecology

## 2020-03-06 ENCOUNTER — Other Ambulatory Visit: Payer: Self-pay

## 2020-03-06 ENCOUNTER — Observation Stay
Admission: EM | Admit: 2020-03-06 | Discharge: 2020-03-06 | Disposition: A | Discharge status: Home / Self Care | Payer: Medicaid Other | Attending: Obstetrics & Gynecology | Admitting: Obstetrics & Gynecology

## 2020-03-06 ENCOUNTER — Encounter: Payer: Self-pay | Admitting: Obstetrics & Gynecology

## 2020-03-06 DIAGNOSIS — Z3A20 20 weeks gestation of pregnancy: Secondary | ICD-10-CM | POA: Diagnosis not present

## 2020-03-06 DIAGNOSIS — O99512 Diseases of the respiratory system complicating pregnancy, second trimester: Secondary | ICD-10-CM | POA: Insufficient documentation

## 2020-03-06 DIAGNOSIS — R1030 Lower abdominal pain, unspecified: Secondary | ICD-10-CM | POA: Insufficient documentation

## 2020-03-06 DIAGNOSIS — O26892 Other specified pregnancy related conditions, second trimester: Secondary | ICD-10-CM | POA: Diagnosis not present

## 2020-03-06 DIAGNOSIS — O24112 Pre-existing diabetes mellitus, type 2, in pregnancy, second trimester: Secondary | ICD-10-CM | POA: Diagnosis not present

## 2020-03-06 DIAGNOSIS — O209 Hemorrhage in early pregnancy, unspecified: Secondary | ICD-10-CM | POA: Diagnosis not present

## 2020-03-06 DIAGNOSIS — Z794 Long term (current) use of insulin: Secondary | ICD-10-CM | POA: Diagnosis not present

## 2020-03-06 DIAGNOSIS — Z7982 Long term (current) use of aspirin: Secondary | ICD-10-CM | POA: Insufficient documentation

## 2020-03-06 DIAGNOSIS — J45909 Unspecified asthma, uncomplicated: Secondary | ICD-10-CM | POA: Diagnosis not present

## 2020-03-06 DIAGNOSIS — O4692 Antepartum hemorrhage, unspecified, second trimester: Secondary | ICD-10-CM

## 2020-03-06 DIAGNOSIS — Q999 Chromosomal abnormality, unspecified: Secondary | ICD-10-CM

## 2020-03-06 DIAGNOSIS — O99519 Diseases of the respiratory system complicating pregnancy, unspecified trimester: Secondary | ICD-10-CM

## 2020-03-06 DIAGNOSIS — O0990 Supervision of high risk pregnancy, unspecified, unspecified trimester: Secondary | ICD-10-CM

## 2020-03-06 DIAGNOSIS — O24119 Pre-existing diabetes mellitus, type 2, in pregnancy, unspecified trimester: Secondary | ICD-10-CM

## 2020-03-06 DIAGNOSIS — O99210 Obesity complicating pregnancy, unspecified trimester: Secondary | ICD-10-CM

## 2020-03-06 LAB — URINALYSIS, COMPLETE (UACMP) WITH MICROSCOPIC
Bilirubin Urine: NEGATIVE
Glucose, UA: 50 mg/dL — AB
Hgb urine dipstick: NEGATIVE
Ketones, ur: NEGATIVE mg/dL
Nitrite: NEGATIVE
Protein, ur: NEGATIVE mg/dL
Specific Gravity, Urine: 1.023 (ref 1.005–1.030)
pH: 6 (ref 5.0–8.0)

## 2020-03-06 MED ORDER — ACETAMINOPHEN 325 MG PO TABS: 650.0000 mg | ORAL_TABLET | ORAL | Status: DC | PRN

## 2020-03-06 NOTE — Progress Notes (Signed)
Pt discharged home per Tiburcio Pea, MD order. UA was negative, SVE performed closed and thick, no vaginal bleeding on assessment. Pt given the AVS and discharge orders. Pt received labor and bleeding precautions, verbalized understanding and knows when to return. All questions answered at this time. Pt encouraged to go to her next OB appointment. Pt stable and ambulatory, d/c home with significant other via personal vehicle.

## 2020-03-06 NOTE — Telephone Encounter (Signed)
Currently no opening for appointment's today or tomorrow. Would you please contact patient

## 2020-03-06 NOTE — Telephone Encounter (Signed)
Patient returning call.

## 2020-03-06 NOTE — H&P (Signed)
Obstetrics Admission History & Physical   Vaginal Bleeding and Abdominal Cramping   HPI:  29 y.o. [id number] @ [redacted]w[redacted]d(07/22/2020, Alternate EDD Entry). Admitted on 03/06/2020:   Patient Active Problem List   Diagnosis Date Noted  . Second trimester bleeding 03/06/2020  . Supervision of high risk pregnancy, antepartum 12/31/2019  . First trimester bleeding 12/31/2019  . Asthma during pregnancy 12/31/2019  . 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 Pre-existing type 2 diabetes mellitus during pregnancy, antepartum 12/28/2019  . Maternal obesity, antepartum 12/28/2019  . Myopia, left eye 12/07/2017  . Type 2 diabetes mellitus without complication, without long-term current use of insulin ([place] 12/07/2017  . Migraine without status migrainosus, not intractable 11/19/2015  . Morbid obesity due to excess calories ([place] 11/19/2015  . Environmental allergies 03/17/2015  . Asthma 11/21/2014  . Mild intermittent asthma without complication 063/87/5643 . Left-sided low back pain with sciatica 08/09/2014  . Insomnia 11/20/2013  . Functional constipation 07/03/2013  . GERD (gastroesophageal reflux disease) 07/03/2013  . Anxiety 05/16/2013  . Depression 05/16/2013     Presents for episode of spotting this am (once) and then cramping all day today, intermittent and moderate at times, and radiation to back and modified w rest and no associated findings..   [facility] P[facility] complicated by diabetes, obesity, asthma, chromosomal disorder, GERD, anxiety, and depression..  ROS: A review of systems was performed and negative, except as stated in the above HPI. PMHx:  Past Medical History:  Diagnosis Date  . ADD (attention deficit disorder)   . Anxiety   . Asthma   . Depression   . Diabetes mellitus without complication ([place]   . GERD (gastroesophageal reflux disease)   . IBS (irritable bowel syndrome)   . Migraine   . MRSA (methicillin resistant staph aureus) culture positive    . Oligomenorrhea   . Pyloric stenosis   . UTI (urinary tract infection)    PSHx:  Past Surgical History:  Procedure Laterality Date  . COLPOSCOPY    . DILATION AND CURETTAGE OF UTERUS  age 29 . HYSTEROSCOPY  2015  . TONSILLECTOMY    . WISDOM TOOTH EXTRACTION     Medications:  Medications Prior to Admission  Medication Sig Dispense Refill Last Dose  . Accu-Chek Softclix Lancets lancets 1 each by Other route 4 (four) times daily. Use as instructed 200 each 2 03/05/2020  . ADDERALL XR 20 MG 24 hr capsule Take 1 capsule (20 mg total) by mouth 2 (two) times daily. 30 capsule 0 03/06/2020 at Unknown time  . albuterol (PROVENTIL HFA;VENTOLIN HFA) 108 (90 Base) MCG/ACT inhaler Inhale 2 puffs into the lungs every 4 (four) hours as needed for wheezing or shortness of breath. 1 Inhaler 0 03/06/2020 at Unknown time  . ALLERGIST TRAY 1CC 27GX3/8" 27G X 3/8" 1 ML KIT USE AS DIRECTED PER MD   03/06/2020 at Unknown time  . aspirin EC 81 MG tablet Take 1 tablet (81 mg total) by mouth daily. Take after 12 weeks for prevention of preeclampssia later in pregnancy 300 tablet 2 03/06/2020 at Unknown time  . Blood Glucose Monitoring Suppl (ACCU-CHEK AVIVA PLUS) w/Device KIT 1 Units by Does not apply route in the morning, at noon, in the evening, and at bedtime. 1 kit 0 03/06/2020 at Unknown time  . cetirizine (ZYRTEC) 10 MG tablet Take 1 tablet (10 mg total) by mouth daily. 30 tablet 11 03/06/2020 at Unknown time  . clonazePAM (KLONOPIN) 1 MG tablet TAKE 1/2 TABLET  BY MOUTH EVERY MORNING, TAKE 1/2 TABLET BY MOUTH EVERY AFTERNOON & TAKE ONE TABLET BY MOUTH AT BEDTIME 30 tablet 0 03/06/2020 at Unknown time  . cyclobenzaprine (FLEXERIL) 10 MG tablet Take 1 tablet (10 mg total) by mouth 3 (three) times daily as needed for muscle spasms. 30 tablet 2 03/06/2020 at Unknown time  . Doxylamine-Pyridoxine (DICLEGIS) 10-10 MG TBEC Take 2 tablets by mouth at bedtime. If symptoms persist, add one tablet in the morning and one in the  afternoon 100 tablet 5 03/06/2020 at Unknown time  . EPINEPHrine 0.3 mg/0.3 mL IJ SOAJ injection USE AS DIRECTED PER MD FOR ANAPHYLAXIS   03/06/2020 at Unknown time  . famotidine (PEPCID) 20 MG tablet Take 1 tablet (20 mg total) by mouth daily. 30 tablet 11 03/06/2020 at Unknown time  . Fluticasone-Salmeterol (ADVAIR) 250-50 MCG/DOSE AEPB Inhale 1 puff into the lungs daily. 60 each 3 03/06/2020 at Unknown time  . folic acid (FOLVITE) 1 MG tablet Take 1 tablet (1 mg total) by mouth daily. 30 tablet 10 03/06/2020 at Unknown time  . glucose blood test strip Use as instructed 100 each 12 03/06/2020 at Unknown time  . insulin glargine (LANTUS) 100 UNIT/ML Solostar Pen Inject 50 Units into the skin at bedtime. 15 mL 11 03/06/2020 at Unknown time  . Insulin Pen Needle (NOVOFINE) 32G X 6 MM MISC 1 Units by Does not apply route in the morning, at noon, in the evening, and at bedtime. 200 each 3 03/06/2020 at Unknown time  . insulin regular human CONCENTRATED (HUMULIN R U-500 KWIKPEN) 500 UNIT/ML kwikpen Inject 10 Units into the skin 3 (three) times daily before meals. (Patient taking differently: Inject 10 Units into the skin 3 (three) times daily before meals. 15 units if BG over 150 mg/dL) 3 mL 6 03/05/2020  . montelukast (SINGULAIR) 10 MG tablet Take 1 tablet (10 mg total) by mouth at bedtime. 30 tablet 11 03/06/2020 at Unknown time  . pantoprazole (PROTONIX) 40 MG tablet Take 1 tablet (40 mg total) by mouth daily. 30 tablet 11 03/06/2020 at Unknown time  . sertraline (ZOLOFT) 50 MG tablet Take 1 tablet (50 mg total) by mouth daily. 30 tablet 2 03/06/2020 at Unknown time  . Spacer/Aero-Holding Chambers (AEROCHAMBER PLUS) inhaler Use as instructed 1 each 2 03/06/2020 at Unknown time  . SYMBICORT 160-4.5 MCG/ACT inhaler Inhale 2 puffs into the lungs in the morning and at bedtime.   03/06/2020 at Unknown time   Allergies: is allergic to cefprozil, amoxil [amoxicillin], and augmentin [amoxicillin-pot clavulanate]. OBHx:   OB History  Gravida Para Term Preterm AB Living  _0 SAB TAB Ectopic Multiple Live Births  2       1    # Outcome Date GA Lbr Len/2nd Weight Sex Delivery Anes PTL Lv  4 Current           3 Term 08/14/12   3118 g M Vag-Spont  N LIV  2 SAB           1 SAB            GXQ:JJHERDEY/CXKGYJEHUDJS except as detailed in HPI.Marland Kitchen  No family history of birth defects. Soc Hx: Alcohol: none and Recreational drug use: none  Objective:  There were no vitals filed for this visit. Constitutional: Well nourished, well developed female in no acute distress.  HEENT: normal Skin: Warm and dry.  Cardiovascular:Regular rate and rhythm.   Extremity: trace to 1+ bilateral pedal edema  Respiratory: Clear to auscultation bilateral. Normal respiratory effort Abdomen: gravid, ND, FHT present, mild tenderness on exam Back: no CVAT Neuro: DTRs 2+, Cranial nerves grossly intact Psych: Alert and Oriented x3. No memory deficits. Normal mood and affect.  MS: normal gait, normal bilateral lower extremity ROM/strength/stability.  Pelvic exam: is not limited by body habitus EGBUS: within normal limits Vagina: within normal limits and with normal mucosa Cervix: CERVIX: closed, thick, high, no palpable presenting part Uterus: enlarged Adnexa: not evaluated FHT 140s  Assessment & Plan:   29 y.o. [id number] @ [redacted]w[redacted]d Admitted on 03/06/2020:lower abdominal pain and one episode of vaginal bleeding   No s/sx cervical incompetence, miscarriage, preterm labor, cervical lesion, vaginal lesion or active bleeding now, or fetal demise.  FHT reassuring.   Will check UA for UTI as possible etiology for her pain.  Will encourage hydration and activity restriction today to see if improves.   She will cont w diabetic meds and monitoring.   PBarnett Applebaum MD, FLoura PardonOb/Gyn, [facility] 03/06/2020  4:36 PM

## 2020-03-06 NOTE — Telephone Encounter (Signed)
Pt states she is still having light bleeding but real heavy cramping; no IC before bleeding started.  Adv to go to L&D via ed.  Jamie notified.

## 2020-03-06 NOTE — OB Triage Note (Signed)
Pt presents to the ED c/o vaginal bleeding and abdominal cramping. Pt is a 29 y/o G4P1021 [redacted]w[redacted]d. Pt reports one episode of pink tinged bleeding in the morning when she wiped after urinating. Pt described the bleeding as quarter size and pink not bright red.  Pt reports constant 8/10 lower abdominal pain after she had noticed the bleeding. Pt denies recent sexual intercourse or having anything in the vagina. No vaginal bleeding present on assessment.  Pt denies leaking of fluid. External monitors applied, initial FHT 144. VSS. Harris, MD notified of pt, orders placed and released. UA obtained and sent to lab.

## 2020-03-13 ENCOUNTER — Ambulatory Visit: Payer: Medicaid Other | Admitting: Dietician

## 2020-03-13 ENCOUNTER — Ambulatory Visit (INDEPENDENT_AMBULATORY_CARE_PROVIDER_SITE_OTHER): Payer: Medicaid Other | Admitting: Obstetrics and Gynecology

## 2020-03-13 ENCOUNTER — Other Ambulatory Visit: Payer: Self-pay

## 2020-03-13 VITALS — BP 115/70 | Wt 351.0 lb

## 2020-03-13 DIAGNOSIS — O0990 Supervision of high risk pregnancy, unspecified, unspecified trimester: Secondary | ICD-10-CM

## 2020-03-13 DIAGNOSIS — E119 Type 2 diabetes mellitus without complications: Secondary | ICD-10-CM

## 2020-03-13 DIAGNOSIS — Z3A21 21 weeks gestation of pregnancy: Secondary | ICD-10-CM

## 2020-03-13 DIAGNOSIS — J4520 Mild intermittent asthma, uncomplicated: Secondary | ICD-10-CM

## 2020-03-13 DIAGNOSIS — O24119 Pre-existing diabetes mellitus, type 2, in pregnancy, unspecified trimester: Secondary | ICD-10-CM

## 2020-03-13 DIAGNOSIS — O99210 Obesity complicating pregnancy, unspecified trimester: Secondary | ICD-10-CM

## 2020-03-13 LAB — POCT URINALYSIS DIPSTICK OB
Glucose, UA: NEGATIVE
POC,PROTEIN,UA: NEGATIVE

## 2020-03-13 NOTE — Progress Notes (Signed)
ROB

## 2020-03-13 NOTE — Progress Notes (Signed)
Routine Prenatal Care Visit  Subjective  Yvonne Rios is a 30 y.o. G4P1021 at [redacted]w[redacted]d being seen today for ongoing prenatal care.  She is currently monitored for the following issues for this high-risk pregnancy and has Anxiety; Asthma; Depression; Environmental allergies; Functional constipation; GERD (gastroesophageal reflux disease); Insomnia; Left-sided low back pain with sciatica; Migraine without status migrainosus, not intractable; Mild intermittent asthma without complication; Morbid obesity due to excess calories (HCC); Myopia, left eye; Type 2 diabetes mellitus without complication, without long-term current use of insulin (HCC); Pre-existing type 2 diabetes mellitus during pregnancy, antepartum; Maternal obesity, antepartum; Supervision of high risk pregnancy, antepartum; First trimester bleeding; Asthma during pregnancy; Chromosome abnormality; and Second trimester bleeding on their problem list.  ----------------------------------------------------------------------------------- Patient reports no complaints.   Contractions: Not present. Vag. Bleeding: None.  Movement: Present. Denies leaking of fluid.  ----------------------------------------------------------------------------------- The following portions of the patient's history were reviewed and updated as appropriate: allergies, current medications, past family history, past medical history, past social history, past surgical history and problem list. Problem list updated.   Objective  Blood pressure 115/70, weight (!) 351 lb (159.2 kg), last menstrual period 10/18/2019. Pregravid weight 350 lb (158.8 kg) Total Weight Gain 1 lb (0.454 kg) Urinalysis:      Fetal Status: Fetal Heart Rate (bpm): 135   Movement: Present     General:  Alert, oriented and cooperative. Patient is in no acute distress.  Skin: Skin is warm and dry. No rash noted.   Cardiovascular: Normal heart rate noted  Respiratory: Normal respiratory  effort, no problems with respiration noted  Abdomen: Soft, gravid, appropriate for gestational age. Pain/Pressure: Present     Pelvic:  Cervical exam deferred        Extremities: Normal range of motion.     ental Status: Normal mood and affect. Normal behavior. Normal judgment and thought content.     Assessment   29 y.o. B1D1761 at [redacted]w[redacted]d by  07/22/2020, Alternate EDD Entry presenting for work-in prenatal visit  Plan   FOURTH Problems (from 12/28/19 to present)    Problem Noted Resolved   Supervision of high risk pregnancy, antepartum 12/31/2019 by Vena Austria, MD No   Overview Addendum 01/25/2020 12:53 PM by Vena Austria, MD    [facility] Prenatal Labs  Dating LMP = 6week [place] Blood type: O/Positive/-- (07/23 1457)   Genetic Screen NIPS: normal XY Fragile X neg, CF neg, SMA neg Antibody:Negative (07/23 1457)  Anatomic [place]  Rubella: 16.40 (07/23 1457) Varicella: Immune  GTT N/A DMII, baseline HgbA1C 9.4 RPR: Non Reactive (07/23 1457)   Rhogam N/A HBsAg: Negative (07/23 1457)   TDaP vaccine                       Flu Shot: HIV: Non Reactive (07/23 1457)   Baby Food                                GBS:   Contraception  Pap: 12/28/2019 NILM  CBB     CS/VBAC N/A   Support Person Husband [name]           Previous Version   First trimester bleeding 12/31/2019 by Vena Austria, MD No   Asthma during pregnancy 12/31/2019 by Vena Austria, MD No   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 by Vena Austria, MD No   Overview Addendum 12/31/2019 10:53 PM by Vena Austria, MD    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 A2/B GDM)  For A2/B GDM or higher classes of  DM [X]  Diabetes Education and Testing Supplies [X]  Nutrition Counsult [ ]  Fetal ECHO after 22-24 weeks  [ ]  Eye exam for retina evaluation  [ ]  Baseline EKG [ ]  fetal growth every 4 weeks starting at 28 weeks [ ]  Twice weekly NST starting at [redacted] weeks gestation [ ]  Delivery planning contingent on fetal growth, AFI, glycemic control, and other co-morbidities but at least by 39 weeks  Baseline and surveillance labs (pulled in from [facility], refresh links as needed)  Lab Results  Component Value Date   CREATININE 0.53 (L) 12/28/2019   AST 14 12/28/2019   ALT 14 12/28/2019   TSH 1.390 09/20/2016   Lab Results  Component Value Date   HGBA1C 9.4 (H) 12/28/2019   HGBA1C 6.6 (H) 09/20/2016    Antenatal Testing Class of DM U/S NST/AFI DELIVERY  Diabetes   A1 - good control - O24.410    A2 - good control - O24.419      A2  - poor control or poor compliance - O24.419, E11.65   (Macrosomia or polyhydramnios) **E11.65 is extra code for poor control**    A2/B - O24.919  and B-C O24.319  Poor control B-C or D-R-F-T - O24.319  or  Type I DM - O24.019  20-38  20-38  20-24-28-32-36   20-24-28-32-35-38//fetal echo  20-24-27-30-33-36-38//fetal echo  40  32//2 x wk  32//2 x wk   32//2 x wk  28//BPP wkly then 32//2 x wk  40  39  PRN   39  PRN          Maternal obesity, antepartum 12/28/2019 by 12/30/2019, MD No       Gestational age appropriate obstetric precautions including but not limited to vaginal bleeding, contractions, leaking of fluid and fetal movement were reviewed in detail with the patient.    1) BG log reviewed - no clear pattern still having intermittent significantly elevated values.  Continue weekly follow up  Return in about 1 week (around 03/20/2020) for ROB MD only.  07-07[birth year]1983, MD, [birth year]-08-1971 OB/GYN, [facility] 03/13/2020, 4:41 PM

## 2020-03-19 ENCOUNTER — Encounter: Payer: Medicaid Other | Attending: Obstetrics and Gynecology | Admitting: Dietician

## 2020-03-19 ENCOUNTER — Encounter: Payer: Self-pay | Admitting: Dietician

## 2020-03-19 ENCOUNTER — Other Ambulatory Visit: Payer: Self-pay

## 2020-03-19 VITALS — BP 105/65 | Ht 68.0 in | Wt 356.5 lb

## 2020-03-19 DIAGNOSIS — O24119 Pre-existing diabetes mellitus, type 2, in pregnancy, unspecified trimester: Secondary | ICD-10-CM | POA: Diagnosis not present

## 2020-03-19 NOTE — Progress Notes (Signed)
.   Patient self-reported BG: fasting BGs ranging 70-100 , and post-meal BGs ranging 120-150 . Patient's food diary indicates decrease in sugar-sweetened beverages (8 cans/day of regular soda down to 3-4 cans/day), need to more consistent lean protein source at meals and snacks, need more whole grains, and more fiber; good distribution of carbs throughout the day, eating breakfast more regularly   . Provided individualized menus based on patient's food preferences. . Instructed patient on food safety, including avoidance of Listeriosis, and limiting mercury from fish. . Discussed importance of maintaining healthy lifestyle habits to continue to self-manage Type 2 DM after pregnancy. . Advised patient how to understand the signs of a low blood sugar and how to treat a low blood sugar

## 2020-03-20 ENCOUNTER — Ambulatory Visit (INDEPENDENT_AMBULATORY_CARE_PROVIDER_SITE_OTHER): Payer: Medicaid Other | Admitting: Obstetrics and Gynecology

## 2020-03-20 VITALS — BP 120/70 | Ht 68.0 in | Wt 356.4 lb

## 2020-03-20 DIAGNOSIS — O24119 Pre-existing diabetes mellitus, type 2, in pregnancy, unspecified trimester: Secondary | ICD-10-CM

## 2020-03-20 DIAGNOSIS — Z3A22 22 weeks gestation of pregnancy: Secondary | ICD-10-CM | POA: Diagnosis not present

## 2020-03-20 DIAGNOSIS — O0990 Supervision of high risk pregnancy, unspecified, unspecified trimester: Secondary | ICD-10-CM

## 2020-03-20 DIAGNOSIS — O99210 Obesity complicating pregnancy, unspecified trimester: Secondary | ICD-10-CM

## 2020-03-20 DIAGNOSIS — Z23 Encounter for immunization: Secondary | ICD-10-CM | POA: Diagnosis not present

## 2020-03-20 DIAGNOSIS — Q999 Chromosomal abnormality, unspecified: Secondary | ICD-10-CM

## 2020-03-20 LAB — POCT URINALYSIS DIPSTICK OB
Glucose, UA: NEGATIVE
POC,PROTEIN,UA: NEGATIVE

## 2020-03-20 NOTE — Progress Notes (Signed)
Routine Prenatal Care Visit  Subjective  Yvonne Rios is a 29 y.o. G4P1021 at [redacted]w[redacted]d being seen today for ongoing prenatal care.  She is currently monitored for the following issues for this high-risk pregnancy and has Anxiety; Asthma; Depression; Environmental allergies; Functional constipation; GERD (gastroesophageal reflux disease); Insomnia; Left-sided low back pain with sciatica; Migraine without status migrainosus, not intractable; Mild intermittent asthma without complication; Morbid obesity due to excess calories (HCC); Myopia, left eye; Type 2 diabetes mellitus without complication, without long-term current use of insulin (HCC); Pre-existing type 2 diabetes mellitus during pregnancy, antepartum; Maternal obesity, antepartum; Supervision of high risk pregnancy, antepartum; First trimester bleeding; Asthma during pregnancy; Chromosome abnormality; Second trimester bleeding; and Attention deficit disorder (ADD) in adult on their problem list.  ----------------------------------------------------------------------------------- Patient reports no complaints.   Contractions: Not present. Vag. Bleeding: None.  Movement: Present. Denies leaking of fluid.  ----------------------------------------------------------------------------------- The following portions of the patient's history were reviewed and updated as appropriate: allergies, current medications, past family history, past medical history, past social history, past surgical history and problem list. Problem list updated.   Objective  Blood pressure 120/70, height 5\' 8"  (1.727 m), weight (!) 356 lb 6.4 oz (161.7 kg), last menstrual period 10/18/2019. Pregravid weight 350 lb (158.8 kg) Total Weight Gain 6 lb 6.4 oz (2.903 kg) Urinalysis:      Fetal Status: Fetal Heart Rate (bpm): 145   Movement: Present     General:  Alert, oriented and cooperative. Patient is in no acute distress.  Skin: Skin is warm and dry. No rash noted.    Cardiovascular: Normal heart rate noted  Respiratory: Normal respiratory effort, no problems with respiration noted  Abdomen: Soft, gravid, appropriate for gestational age. Pain/Pressure: Absent     Pelvic:  Cervical exam deferred        Extremities: Normal range of motion.     ental Status: Normal mood and affect. Normal behavior. Normal judgment and thought content.     Assessment   29 y.o. 37 at [redacted]w[redacted]d by  07/22/2020, Alternate EDD Entry presenting for routine prenatal visit  Plan   FOURTH Problems (from 12/28/19 to present)    Problem Noted Resolved   Supervision of high risk pregnancy, antepartum 12/31/2019 by 01/02/2020, MD No   Overview Addendum 01/25/2020 12:53 PM by 01/27/2020, MD    Clinic [facility] Prenatal Labs  Dating LMP = 6week Vena [place] Blood type: O/Positive/-- (07/23 1457)   Genetic Screen NIPS: normal XY Fragile X neg, CF neg, SMA neg Antibody:Negative (07/23 1457)  Anatomic 10[birth year]27[birth year]1994  Rubella: 16.40 (07/23 1457) Varicella: Immune  GTT N/A DMII, baseline HgbA1C 9.4 RPR: Non Reactive (07/23 1457)   Rhogam N/A HBsAg: Negative (07/23 1457)   TDaP vaccine                       Flu Shot: HIV: Non Reactive (07/23 1457)   Baby Food                                GBS:   Contraception  Pap: 12/28/2019 NILM  CBB     CS/VBAC N/A   Support Person Husband [name]           Previous Version   First trimester bleeding 12/31/2019 by 01/02/2020, MD No   Asthma during pregnancy 12/31/2019 by 01/02/2020, MD No   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 by 01/02/2020, MD No  Overview Addendum 12/31/2019 10:53 PM by Vena Austria, MD    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 A2/B GDM)  For A2/B GDM or higher classes of DM [X]  Diabetes Education and Testing Supplies [X]  Nutrition Counsult [ ]  Fetal ECHO after 22-24 weeks  [ ]  Eye exam for retina evaluation  [ ]  Baseline EKG [ ]  fetal growth every 4 weeks starting at 28 weeks [ ]  Twice weekly NST starting at [redacted] weeks gestation [ ]  Delivery planning contingent on fetal growth, AFI, glycemic control, and other co-morbidities but at least by 39 weeks  Baseline and surveillance labs (pulled in from [facility], refresh links as needed)  Lab Results  Component Value Date   CREATININE 0.53 (L) 12/28/2019   AST 14 12/28/2019   ALT 14 12/28/2019   TSH 1.390 09/20/2016   Lab Results  Component Value Date   HGBA1C 9.4 (H) 12/28/2019   HGBA1C 6.6 (H) 09/20/2016    Antenatal Testing Class of DM U/S NST/AFI DELIVERY  Diabetes   A1 - good control - O24.410    A2 - good control - O24.419      A2  - poor control or poor compliance - O24.419, E11.65   (Macrosomia or polyhydramnios) **E11.65 is extra code for poor control**    A2/B - O24.919  and B-C O24.319  Poor control B-C or D-R-F-T - O24.319  or  Type I DM - O24.019  20-38  20-38  20-24-28-32-36   20-24-28-32-35-38//fetal echo  20-24-27-30-33-36-38//fetal echo  40  32//2 x wk  32//2 x wk   32//2 x wk  28//BPP wkly then 32//2 x wk  40  39  PRN   39  PRN          Maternal obesity, antepartum 12/28/2019 by 12/30/2019, MD No       Gestational age appropriate obstetric precautions including but not limited to vaginal bleeding, contractions, leaking of fluid and fetal movement were reviewed in detail with the patient.    - Has had to reschedule echo - DMII remain elevated at time but also normal readings interspersed.  No clear pattern noted.  Remains on Lantus 50mg  daily and Humolog 10 with meals with instructions for an additional 5mg  for values above 150 postprandial  Return in about 1 week  (around 03/27/2020) for ROB MD 1 week.  07-07-1983, MD, 03-08-1971 [facility], [facility] 03/20/2020, 11:32 AM

## 2020-03-26 ENCOUNTER — Ambulatory Visit (INDEPENDENT_AMBULATORY_CARE_PROVIDER_SITE_OTHER): Payer: Medicaid Other | Admitting: Obstetrics and Gynecology

## 2020-03-26 ENCOUNTER — Other Ambulatory Visit: Payer: Self-pay

## 2020-03-26 ENCOUNTER — Encounter: Payer: Self-pay | Admitting: Obstetrics and Gynecology

## 2020-03-26 DIAGNOSIS — O99210 Obesity complicating pregnancy, unspecified trimester: Secondary | ICD-10-CM

## 2020-03-26 DIAGNOSIS — Q999 Chromosomal abnormality, unspecified: Secondary | ICD-10-CM

## 2020-03-26 DIAGNOSIS — Z5329 Procedure and treatment not carried out because of patient's decision for other reasons: Secondary | ICD-10-CM

## 2020-03-26 DIAGNOSIS — J45909 Unspecified asthma, uncomplicated: Secondary | ICD-10-CM

## 2020-03-26 DIAGNOSIS — O99519 Diseases of the respiratory system complicating pregnancy, unspecified trimester: Secondary | ICD-10-CM

## 2020-03-26 DIAGNOSIS — O24119 Pre-existing diabetes mellitus, type 2, in pregnancy, unspecified trimester: Secondary | ICD-10-CM

## 2020-03-26 DIAGNOSIS — Z91199 Patient's noncompliance with other medical treatment and regimen due to unspecified reason: Secondary | ICD-10-CM

## 2020-03-26 DIAGNOSIS — O0990 Supervision of high risk pregnancy, unspecified, unspecified trimester: Secondary | ICD-10-CM

## 2020-03-26 NOTE — Progress Notes (Signed)
Unable to reach patient follow up in 2 weeks Currently on Lantus 50U qhs and Humulin 10U AC TID with 5 additional units for postprandials above 150

## 2020-04-03 ENCOUNTER — Other Ambulatory Visit: Payer: Self-pay

## 2020-04-03 ENCOUNTER — Ambulatory Visit
Admission: EM | Admit: 2020-04-03 | Discharge: 2020-04-03 | Disposition: A | Discharge status: Home / Self Care | Payer: Medicaid Other | Attending: Family Medicine | Admitting: Family Medicine

## 2020-04-03 DIAGNOSIS — J019 Acute sinusitis, unspecified: Secondary | ICD-10-CM | POA: Diagnosis not present

## 2020-04-03 LAB — GROUP A STREP BY PCR: Group A Strep by PCR: NOT DETECTED

## 2020-04-03 MED ORDER — AZITHROMYCIN 250 MG PO TABS: ORAL_TABLET | ORAL | 0 refills | Status: DC

## 2020-04-03 NOTE — ED Triage Notes (Signed)
Patient complains of nasal congestion, runny nose and sore throat x 1 week. States that she has previously had covid and is not interested in testing. Reports that she thinks this may be strep or sinus infection.

## 2020-04-03 NOTE — ED Provider Notes (Signed)
[facility]    CSN: [id number] Arrival date & time: 04/03/20  1318  History   Chief Complaint Chief Complaint  Patient presents with   Nasal Congestion   HPI  29 year old female presents with respiratory symptoms.   Reports runny nose, congestion, and sore throat x 1 week.  She is concerned that she may have strep throat.  Declines Covid testing today.  No fever.  No relieving factors.  She is currently pregnant.  No other complaints.   Past Medical History:  Diagnosis Date   ADD (attention deficit disorder)    Anxiety    Asthma    Depression    Diabetes mellitus without complication (HCC)    GERD (gastroesophageal reflux disease)    IBS (irritable bowel syndrome)    Migraine    MRSA (methicillin resistant staph aureus) culture positive    Oligomenorrhea    Pyloric stenosis    UTI (urinary tract infection)     Patient Active Problem List   Diagnosis Date Noted   Supervision of high risk pregnancy, antepartum 12/31/2019   Asthma during pregnancy 12/31/2019   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   Pre-existing type 2 diabetes mellitus during pregnancy, antepartum 12/28/2019   Maternal obesity, antepartum 12/28/2019   Attention deficit disorder (ADD) in adult 09/28/2019   Myopia, left eye 12/07/2017   Type 2 diabetes mellitus without complication, without long-term current use of insulin ([place]) 12/07/2017   Migraine without status migrainosus, not intractable 11/19/2015   Morbid obesity due to excess calories ([place]) 11/19/2015   Environmental allergies 03/17/2015   Asthma 11/21/2014   Mild intermittent asthma without complication 62/37/6283   Left-sided low back pain with sciatica 08/09/2014   Insomnia 11/20/2013   Functional constipation 07/03/2013   GERD (gastroesophageal reflux disease) 07/03/2013   Anxiety 05/16/2013   Depression 05/16/2013    Past Surgical History:  Procedure Laterality Date   COLPOSCOPY      DILATION AND CURETTAGE OF UTERUS  age 61   HYSTEROSCOPY  68   TONSILLECTOMY     WISDOM TOOTH EXTRACTION      OB History    Gravida  4   Para  1   Term  1   Preterm      AB  2   Living  1     SAB  2   TAB      Ectopic      Multiple      Live Births  1            Home Medications    Prior to Admission medications   Medication Sig Start Date End Date Taking? Authorizing Provider  Accu-Chek Softclix Lancets lancets 1 each by Other route 4 (four) times daily. Use as instructed 01/07/20  Yes Malachy Mood, MD  ADDERALL XR 20 MG 24 hr capsule Take 1 capsule (20 mg total) by mouth 2 (two) times daily. 12/28/19  Yes Malachy Mood, MD  albuterol (PROVENTIL HFA;VENTOLIN HFA) 108 (90 Base) MCG/ACT inhaler Inhale 2 puffs into the lungs every 4 (four) hours as needed for wheezing or shortness of breath. 05/01/17  Yes Melynda Ripple, MD  ALLERGIST TRAY 1CC 27GX3/8" 27G X 3/8" 1 ML KIT USE AS DIRECTED PER MD 02/15/20  Yes [provider]  aspirin EC 81 MG tablet Take 1 tablet (81 mg total) by mouth daily. Take after 12 weeks for prevention of preeclampssia later in pregnancy 01/25/20  Yes Malachy Mood, MD  Blood Glucose Monitoring Suppl (ACCU-CHEK AVIVA PLUS) w/Device  KIT 1 Units by Does not apply route in the morning, at noon, in the evening, and at bedtime. 01/07/20  Yes Malachy Mood, MD  cetirizine (ZYRTEC) 10 MG tablet Take 1 tablet (10 mg total) by mouth daily. 12/28/19 12/28/20 Yes Malachy Mood, MD  clonazePAM (KLONOPIN) 1 MG tablet TAKE 1/2 TABLET BY MOUTH EVERY MORNING, TAKE 1/2 TABLET BY MOUTH EVERY AFTERNOON & TAKE ONE TABLET BY MOUTH AT BEDTIME 12/28/19  Yes Malachy Mood, MD  cyclobenzaprine (FLEXERIL) 10 MG tablet Take 1 tablet (10 mg total) by mouth 3 (three) times daily as needed for muscle spasms. 01/25/20  Yes Malachy Mood, MD  EPINEPHrine 0.3 mg/0.3 mL IJ SOAJ injection USE AS DIRECTED PER MD FOR ANAPHYLAXIS 06/09/19  Yes  [provider]  famotidine (PEPCID) 20 MG tablet Take 1 tablet (20 mg total) by mouth daily. 12/28/19 12/27/20 Yes Malachy Mood, MD  Fluticasone-Salmeterol (ADVAIR) 250-50 MCG/DOSE AEPB Inhale 1 puff into the lungs daily. 12/28/19  Yes Malachy Mood, MD  folic acid (FOLVITE) 1 MG tablet Take 1 tablet (1 mg total) by mouth daily. 01/25/20  Yes Malachy Mood, MD  insulin glargine (LANTUS) 100 UNIT/ML Solostar Pen Inject 50 Units into the skin at bedtime. 02/12/20  Yes Malachy Mood, MD  Insulin Pen Needle (NOVOFINE) 32G X 6 MM MISC 1 Units by Does not apply route in the morning, at noon, in the evening, and at bedtime. 01/07/20  Yes Malachy Mood, MD  insulin regular human CONCENTRATED (HUMULIN R U-500 KWIKPEN) 500 UNIT/ML kwikpen Inject 10 Units into the skin 3 (three) times daily before meals. Patient taking differently: Inject 10 Units into the skin 3 (three) times daily before meals. 15 units if BG over 150 mg/dL 02/08/20  Yes Malachy Mood, MD  montelukast (SINGULAIR) 10 MG tablet Take 1 tablet (10 mg total) by mouth at bedtime. 12/28/19 12/27/20 Yes Malachy Mood, MD  pantoprazole (PROTONIX) 40 MG tablet Take 1 tablet (40 mg total) by mouth daily. 12/28/19 12/27/20 Yes Malachy Mood, MD  sertraline (ZOLOFT) 50 MG tablet Take 1 tablet (50 mg total) by mouth daily. 12/28/19  Yes Malachy Mood, MD  Spacer/Aero-Holding Chambers (AEROCHAMBER PLUS) inhaler Use as instructed 05/01/17  Yes Melynda Ripple, MD  SYMBICORT 160-4.5 MCG/ACT inhaler Inhale 2 puffs into the lungs in the morning and at bedtime. 12/05/19  Yes [provider]  azithromycin (ZITHROMAX) 250 MG tablet 2 tablets on day 1, then 1 tablet daily on days 2-5. 04/03/20   Coral Spikes, DO  glucose blood test strip Use as instructed 01/07/20   Malachy Mood, MD    Family History Family History  Problem Relation Age of Onset   Ovarian cancer Mother 56   Other Father     Social  History Social History   Tobacco Use   Smoking status: Former Smoker   Smokeless tobacco: Never Used   Tobacco comment: smoked as teen  Scientific laboratory technician Use: Never used  Substance Use Topics   Alcohol use: Not Currently    Alcohol/week: 0.0 standard drinks    Comment: rarely   Drug use: No     Allergies   Cefprozil, Amoxil [amoxicillin], and Augmentin [amoxicillin-pot clavulanate]   Review of Systems Review of Systems Per HPI  Physical Exam Triage Vital Signs ED Triage Vitals  Enc Vitals Group     BP 04/03/20 1336 117/62     Pulse Rate 04/03/20 1336 68     Resp 04/03/20 1336 18     Temp  04/03/20 1336 98.5 F (36.9 C)     Temp Source 04/03/20 1336 Oral     SpO2 04/03/20 1336 98 %     Weight 04/03/20 1332 (!) 360 lb (163.3 kg)     Height 04/03/20 1332 $RemoveBefor'5\' 8"'okcPSqHJsFhs$  (1.727 m)     Head Circumference --      Peak Flow --      Pain Score 04/03/20 1332 8     Pain Loc --      Pain Edu? --      Excl. in [place]? --    Updated Vital Signs BP 117/62 (BP Location: Left Arm)    Pulse 68    Temp 98.5 F (36.9 C) (Oral)    Resp 18    Ht $R'5\' 8"'he$  (1.727 m)    Wt (!) 163.3 kg    LMP 10/18/2019    SpO2 98%    Breastfeeding No    BMI 54.74 kg/m   Visual Acuity Right Eye Distance:   Left Eye Distance:   Bilateral Distance:    Right Eye Near:   Left Eye Near:    Bilateral Near:     Physical Exam Vitals and nursing note reviewed.  Constitutional:      General: She is not in acute distress.    Appearance: Normal appearance. She is obese. She is not ill-appearing.  HENT:     Head: Normocephalic and atraumatic.     Right Ear: Tympanic membrane normal.     Left Ear: Tympanic membrane normal.     Mouth/Throat:     Pharynx: Oropharynx is clear. No oropharyngeal exudate or posterior oropharyngeal erythema.  Eyes:     General:        Right eye: No discharge.        Left eye: No discharge.     Conjunctiva/sclera: Conjunctivae normal.  Cardiovascular:     Rate and Rhythm: Normal  rate and regular rhythm.     Heart sounds: No murmur heard.   Pulmonary:     Effort: Pulmonary effort is normal.     Breath sounds: No wheezing, rhonchi or rales.  Neurological:     Mental Status: She is alert.  Psychiatric:        Mood and Affect: Mood normal.        Behavior: Behavior normal.    UC Treatments / Results  Labs (all labs ordered are listed, but only abnormal results are displayed) Labs Reviewed  GROUP A STREP BY PCR    EKG   Radiology No results found.  Procedures Procedures (including critical care time)  Medications Ordered in UC Medications - No data to display  Initial Impression / Assessment and Plan / UC Course  I have reviewed the triage vital signs and the nursing notes.  Pertinent labs & imaging results that were available during my care of the patient were reviewed by me and considered in my medical decision making (see chart for details).    29 year old female presents with sinusitis. Given allergies, treating with azithromycin.   Final Clinical Impressions(s) / UC Diagnoses   Final diagnoses:  Acute sinusitis, recurrence not specified, unspecified location   Discharge Instructions   None    ED Prescriptions    Medication Sig Dispense Auth. Provider   azithromycin (ZITHROMAX) 250 MG tablet 2 tablets on day 1, then 1 tablet daily on days 2-5. 6 tablet Coral Spikes, DO     PDMP not reviewed this encounter.   Thersa Salt  G, DO 04/03/20 1855

## 2020-04-04 ENCOUNTER — Ambulatory Visit (INDEPENDENT_AMBULATORY_CARE_PROVIDER_SITE_OTHER): Payer: Medicaid Other | Admitting: Obstetrics and Gynecology

## 2020-04-04 DIAGNOSIS — O99210 Obesity complicating pregnancy, unspecified trimester: Secondary | ICD-10-CM

## 2020-04-04 DIAGNOSIS — Q999 Chromosomal abnormality, unspecified: Secondary | ICD-10-CM

## 2020-04-04 DIAGNOSIS — O99519 Diseases of the respiratory system complicating pregnancy, unspecified trimester: Secondary | ICD-10-CM

## 2020-04-04 DIAGNOSIS — O0990 Supervision of high risk pregnancy, unspecified, unspecified trimester: Secondary | ICD-10-CM

## 2020-04-04 DIAGNOSIS — O24119 Pre-existing diabetes mellitus, type 2, in pregnancy, unspecified trimester: Secondary | ICD-10-CM

## 2020-04-04 DIAGNOSIS — J45909 Unspecified asthma, uncomplicated: Secondary | ICD-10-CM

## 2020-04-04 DIAGNOSIS — E1165 Type 2 diabetes mellitus with hyperglycemia: Secondary | ICD-10-CM

## 2020-04-04 DIAGNOSIS — Z3A24 24 weeks gestation of pregnancy: Secondary | ICD-10-CM

## 2020-04-04 MED ORDER — HUMULIN R U-500 KWIKPEN 500 UNIT/ML ~~LOC~~ SOPN
15.0000 [IU] | PEN_INJECTOR | Freq: Three times a day (TID) | SUBCUTANEOUS | 6 refills | Status: DC
Start: 2020-04-04 — End: 2020-05-18

## 2020-04-04 MED ORDER — INSULIN GLARGINE 100 UNIT/ML SOLOSTAR PEN
60.0000 [IU] | PEN_INJECTOR | Freq: Every day | SUBCUTANEOUS | 11 refills | Status: DC
Start: 2020-04-04 — End: 2020-05-18

## 2020-04-04 NOTE — Progress Notes (Signed)
I connected with [place]  on 04/04/20 at  9:10 AM EDT by telephone and verified that I am speaking with the correct person using two identifiers.   I discussed the limitations, risks, security and privacy concerns of performing an evaluation and management service by telephone and the availability of in person appointments. I also discussed with the patient that there may be a patient responsible charge related to this service. The patient expressed understanding and agreed to proceed.  The patient was at home I spoke with the patient from my workstation phone The names of people involved in this encounter were: [place] , and Vena Austria   Routine Prenatal Care Visit  Subjective  Yvonne Rios is a 29 y.o. [phone] at [redacted]w[redacted]d being seen today for ongoing prenatal care.  She is currently monitored for the following issues for this low-risk pregnancy and has Anxiety; Asthma; Depression; Environmental allergies; Functional constipation; GERD (gastroesophageal reflux disease); Insomnia; Left-sided low back pain with sciatica; Migraine without status migrainosus, not intractable; Mild intermittent asthma without complication; Morbid obesity due to excess calories (HCC); Myopia, left eye; Pre-existing type 2 diabetes mellitus during pregnancy, antepartum; Maternal obesity, antepartum; Supervision of high risk pregnancy, antepartum; Asthma during pregnancy; Chromosome abnormality; Attention deficit disorder (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  ----------------------------------------------------------------------------------- Patient reports no complaints.   Contractions: Not present. Vag. Bleeding: None.  Movement: Present. Denies leaking of fluid.  ----------------------------------------------------------------------------------- The following portions of the patient's history were reviewed and  updated as appropriate: allergies, current medications, past family history, past medical history, past social history, past surgical history and problem list. Problem list updated.   Objective  Last menstrual period 10/18/2019. Pregravid weight 350 lb (158.8 kg) Total Weight Gain 6 lb 6.4 oz (2.903 kg)  There is no height or weight on file to calculate BMI.  Urinalysis:      Fetal Status:     Movement: Present     No physical exam as this was a remote telephone visit to promote social distancing during the current COVID-19 Pandemic   Assessment   29 y.o. [id number] at [redacted]w[redacted]d by  07/22/2020, Alternate EDD Entry presenting for routine prenatal visit  Plan   FOURTH Problems (from 12/28/19 to present)    Problem Noted Resolved   Supervision of high risk pregnancy, antepartum 12/31/2019 by Vena Austria, MD No   Overview Addendum 01/25/2020 12:53 PM by Vena Austria, MD    [facility]inic [facility] Prenatal Labs  Dating LMP = 6week [place] Blood type: O/Positive/-- (07/23 1457)   Genetic Screen NIPS: normal XY Fragile X neg, CF neg, SMA neg Antibody:Negative (07/23 1457)  Anatomic [place]  Rubella: 16.40 (07/23 1457) Varicella: Immune  GTT N/A DMII, baseline HgbA1C 9.4 RPR: Non Reactive (07/23 1457)   Rhogam N/A HBsAg: Negative (07/23 1457)   TDaP vaccine                       Flu Shot: HIV: Non Reactive (07/23 1457)   Baby Food                                GBS:   Contraception  Pap: 12/28/2019 NILM  CBB     CS/VBAC N/A   Support Person Husband [name]           Previous Version   Asthma during pregnancy  12/31/2019 by Vena Austria, MD No   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 by Vena Austria, MD No   Overview Addendum 12/31/2019 10:53 PM by Vena Austria, MD    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 A2/B GDM)  For A2/B GDM or higher classes of DM [X]  Diabetes Education and Testing Supplies [X]  Nutrition Counsult [ ]  Fetal ECHO after 22-24 weeks  [ ]  Eye exam for retina evaluation  [ ]  Baseline EKG [ ]  fetal growth every 4 weeks starting at 28 weeks [ ]  Twice weekly NST starting at [redacted] weeks gestation [ ]  Delivery planning contingent on fetal growth, AFI, glycemic control, and other co-morbidities but at least by 39 weeks  Baseline and surveillance labs (pulled in from [facility], refresh links as needed)  Lab Results  Component Value Date   CREATININE 0.53 (L) 12/28/2019   AST 14 12/28/2019   ALT 14 12/28/2019   TSH 1.390 09/20/2016   Lab Results  Component Value Date   HGBA1C 9.4 (H) 12/28/2019   HGBA1C 6.6 (H) 09/20/2016    Antenatal Testing Class of DM U/S NST/AFI DELIVERY  Diabetes   A1 - good control - O24.410    A2 - good control - O24.419      A2  - poor control or poor compliance - O24.419, E11.65   (Macrosomia or polyhydramnios) **E11.65 is extra code for poor control**d    A2/B - O24.919  and B-C O24.319  Poor control B-C or D-R-F-T - O24.319  or  Type I DM - O24.019  20-38  20-38  20-24-28-32-36   20-24-28-32-35-38//fetal echo  20-24-27-30-33-36-38//fetal echo  40  32//2 x wk  32//2 x wk   32//2 x wk  28//BPP wkly then 32//2 x wk  40  39  PRN   39  PRN          Maternal obesity, antepartum 12/28/2019 by 12/30/2019, MD No   First trimester bleeding 12/31/2019 by [birth year]1983, MD 03/26/2020 by [birth year]-08-1971, MD       Gestational age appropriate obstetric precautions including but not limited to vaginal bleeding, contractions, leaking of fluid and fetal movement were reviewed in detail with the patient.    1) Called with URI symptoms, so changed to phone visit.  Had COVID last  November was seen at urgent care yesterday, no Covid test but started on azithromycin  2) GDM  - BG value reviewed an postprandial not improved and more consistently elevated.  Increase Lantus to 60U qHS, and regular 15U qHS as she has been consistently having to cover her postprandials.  Average BG value of 151 to +/-44 (See patient message 03/28/2020.  Will place on repeat HgbA1C to see how patient has responded on insulin overal given starting HgbA1C this pregnancy was 9.4 - start growth scan's 28 weeks - reports fetal echo done and normal   Return in about 1 week (around 04/11/2020) for ROB MD only.  12/30/2019, MD, [facility], [facility] 04/04/2020, 9:36 AM

## 2020-04-07 DIAGNOSIS — Z419 Encounter for procedure for purposes other than remedying health state, unspecified: Secondary | ICD-10-CM | POA: Diagnosis not present

## 2020-04-09 ENCOUNTER — Encounter: Payer: Medicaid Other | Admitting: Obstetrics and Gynecology

## 2020-04-17 ENCOUNTER — Other Ambulatory Visit: Payer: Self-pay | Admitting: Obstetrics and Gynecology

## 2020-04-17 ENCOUNTER — Ambulatory Visit (INDEPENDENT_AMBULATORY_CARE_PROVIDER_SITE_OTHER): Payer: Medicaid Other | Admitting: Obstetrics and Gynecology

## 2020-04-17 ENCOUNTER — Other Ambulatory Visit: Payer: Self-pay

## 2020-04-17 VITALS — BP 118/72 | Wt 358.0 lb

## 2020-04-17 DIAGNOSIS — Z3A26 26 weeks gestation of pregnancy: Secondary | ICD-10-CM

## 2020-04-17 DIAGNOSIS — O24119 Pre-existing diabetes mellitus, type 2, in pregnancy, unspecified trimester: Secondary | ICD-10-CM

## 2020-04-17 DIAGNOSIS — G8929 Other chronic pain: Secondary | ICD-10-CM

## 2020-04-17 DIAGNOSIS — O0990 Supervision of high risk pregnancy, unspecified, unspecified trimester: Secondary | ICD-10-CM

## 2020-04-17 DIAGNOSIS — J45909 Unspecified asthma, uncomplicated: Secondary | ICD-10-CM

## 2020-04-17 DIAGNOSIS — O99519 Diseases of the respiratory system complicating pregnancy, unspecified trimester: Secondary | ICD-10-CM

## 2020-04-17 DIAGNOSIS — M545 Low back pain, unspecified: Secondary | ICD-10-CM

## 2020-04-17 DIAGNOSIS — O99210 Obesity complicating pregnancy, unspecified trimester: Secondary | ICD-10-CM

## 2020-04-17 DIAGNOSIS — Z113 Encounter for screening for infections with a predominantly sexual mode of transmission: Secondary | ICD-10-CM

## 2020-04-17 DIAGNOSIS — Q999 Chromosomal abnormality, unspecified: Secondary | ICD-10-CM

## 2020-04-17 LAB — POCT URINALYSIS DIPSTICK OB: Glucose, UA: NEGATIVE

## 2020-04-17 MED ORDER — ADDERALL XR 20 MG PO CP24
20.0000 mg | ORAL_CAPSULE | Freq: Two times a day (BID) | ORAL | 0 refills | Status: DC
Start: 2020-04-17 — End: 2020-06-16

## 2020-04-17 NOTE — Progress Notes (Signed)
ROB- decreased baby movement

## 2020-04-17 NOTE — Progress Notes (Signed)
Routine Prenatal Care Visit  Subjective  Yvonne Rios is a 29 y.o. [phone] at [redacted]w[redacted]d being seen today for ongoing prenatal care.  She is currently monitored for the following issues for this high-risk pregnancy and has Anxiety; Asthma; Depression; Environmental allergies; Functional constipation; GERD (gastroesophageal reflux disease); Insomnia; Left-sided low back pain with sciatica; Migraine without status migrainosus, not intractable; Mild intermittent asthma without complication; Morbid obesity due to excess calories (HCC); Myopia, left eye; Pre-existing type 2 diabetes mellitus during pregnancy, antepartum; Maternal obesity, antepartum; Supervision of high risk pregnancy, antepartum; Asthma during pregnancy; Chromosome abnormality; Attention deficit disorder (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  ----------------------------------------------------------------------------------- Patient reports no complaints.   Contractions: Not present. Vag. Bleeding: None.  Movement: Present. Denies leaking of fluid.  ----------------------------------------------------------------------------------- The following portions of the patient's history were reviewed and updated as appropriate: allergies, current medications, past family history, past medical history, past social history, past surgical history and problem list. Problem list updated.   Objective  Last menstrual period 10/18/2019. Pregravid weight 350 lb (158.8 kg) Total Weight Gain 8 lb (3.629 kg) Urinalysis:      Fetal Status: Fetal Heart Rate (bpm): 150   Movement: Present     General:  Alert, oriented and cooperative. Patient is in no acute distress.  Skin: Skin is warm and dry. No rash noted.   Cardiovascular: Normal heart rate noted  Respiratory: Normal respiratory effort, no problems with respiration noted  Abdomen: Soft, gravid, appropriate for gestational  age. Pain/Pressure: Absent     Pelvic:  Cervical exam deferred        Extremities: Normal range of motion.     ental Status: Normal mood and affect. Normal behavior. Normal judgment and thought content.   Date Fasting Breakfast Lunch Dinner  11/11 106     11/10  228 181 124  11/9    243 (178)  11/8 142  98   11/7   211 190  11/6   165   11/5   223 225 (152)  11/4 152  105 184  11/3 197 88  136    Assessment   29 y.o. G4P1021 at [redacted]w[redacted]d by  07/22/2020, Alternate EDD Entry presenting for routine prenatal visit  Plan   FOURTH Problems (from 12/28/19 to present)    Problem Noted Resolved   Supervision of high risk pregnancy, antepartum 12/31/2019 by Vena Austria, MD No   Overview Addendum 01/25/2020 12:53 PM by Vena Austria, MD    Clinic [facility] Prenatal Labs  Dating LMP = 6week [place] Blood type: O/Positive/-- (07/23 1457)   Genetic Screen NIPS: normal XY Fragile X neg, CF neg, SMA neg Antibody:Negative (07/23 1457)  Anatomic [place]  Rubella: 16.40 (07/23 1457) Varicella: Immune  GTT N/A DMII, baseline HgbA1C 9.4 RPR: Non Reactive (07/23 1457)   Rhogam N/A HBsAg: Negative (07/23 1457)   TDaP vaccine                       Flu Shot: HIV: Non Reactive (07/23 1457)   Baby Food                                GBS:   Contraception  Pap: 12/28/2019 NILM  CBB     CS/VBAC N/A   Support Person Husband [name]           Previous  Version   Asthma during pregnancy 12/31/2019 by Vena Austria, MD No   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 by Vena Austria, MD No   Overview Addendum 12/31/2019 10:53 PM by Vena Austria, MD    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 A2/B  GDM)  For A2/B GDM or higher classes of DM [X]  Diabetes Education and Testing Supplies [X]  Nutrition Counsult [ ]  Fetal ECHO after 22-24 weeks  [ ]  Eye exam for retina evaluation  [ ]  Baseline EKG [ ]  fetal growth every 4 weeks starting at 28 weeks [ ]  Twice weekly NST starting at [redacted] weeks gestation [ ]  Delivery planning contingent on fetal growth, AFI, glycemic control, and other co-morbidities but at least by 39 weeks  Baseline and surveillance labs (pulled in from [facility], refresh links as needed)  Lab Results  Component Value Date   CREATININE 0.53 (L) 12/28/2019   AST 14 12/28/2019   ALT 14 12/28/2019   TSH 1.390 09/20/2016   Lab Results  Component Value Date   HGBA1C 9.4 (H) 12/28/2019   HGBA1C 6.6 (H) 09/20/2016    Antenatal Testing Class of DM U/S NST/AFI DELIVERY  Diabetes   A1 - good control - O24.410    A2 - good control - O24.419      A2  - poor control or poor compliance - O24.419, E11.65   (Macrosomia or polyhydramnios) **E11.65 is extra code for poor control**    A2/B - O24.919  and B-C O24.319  Poor control B-C or D-R-F-T - O24.319  or  Type I DM - O24.019  20-38  20-38  20-24-28-32-36   20-24-28-32-35-38//fetal echo  20-24-27-30-33-36-38//fetal echo  40  32//2 x wk  32//2 x wk   32//2 x wk  28//BPP wkly then 32//2 x wk  40  39  PRN   39  PRN          Maternal obesity, antepartum 12/28/2019 by 09/22/2016, MD No   First trimester bleeding 12/31/2019 by 09/22/2016, MD 03/26/2020 by 03-08-1971, MD       Gestational age appropriate obstetric precautions including but not limited to vaginal bleeding, contractions, leaking of fluid and fetal movement were reviewed in detail with the patient.    1) DM II - values improved but still remain elevated, occasional normal value as low as 80's - recheck HgbA1C today - Continues Lantus pen at 60U qHS and humolog 15U AC TID.  Lantus pen will go up to 80U - weight  gain 8lbs thus far this pregnancy - Growth scan next visit - start twice weekly APT at 32 weeks  2) Morbid Obesity Body mass index is 54.43 kg/m. - has not seen anesthesia consult placed today  3) ADD - refill aderral discussed weening down to 10mg  in third trimester  4) 28 week labs - will obtain CBC, RPR, HIV today with HgbA1c  5) Back pain - sees chiropractor outside of pregnancy will initiate referral Return in about 2 weeks (around 05/01/2020) for ROB and growth scan (MD only).  [birth year]1983, MD, 09-04-2004 [facility], [facility] 04/17/2020, 12:08 PM

## 2020-04-18 DIAGNOSIS — O99210 Obesity complicating pregnancy, unspecified trimester: Secondary | ICD-10-CM | POA: Diagnosis not present

## 2020-04-18 DIAGNOSIS — O24119 Pre-existing diabetes mellitus, type 2, in pregnancy, unspecified trimester: Secondary | ICD-10-CM | POA: Diagnosis not present

## 2020-04-18 DIAGNOSIS — Z113 Encounter for screening for infections with a predominantly sexual mode of transmission: Secondary | ICD-10-CM | POA: Diagnosis not present

## 2020-04-18 DIAGNOSIS — O0990 Supervision of high risk pregnancy, unspecified, unspecified trimester: Secondary | ICD-10-CM | POA: Diagnosis not present

## 2020-04-19 LAB — CBC
Hematocrit: 32.2 % — ABNORMAL LOW (ref 34.0–46.6)
Hemoglobin: 10.7 g/dL — ABNORMAL LOW (ref 11.1–15.9)
MCH: 27.5 pg (ref 26.6–33.0)
MCHC: 33.2 g/dL (ref 31.5–35.7)
MCV: 83 fL (ref 79–97)
Platelets: 296 10*3/uL (ref 150–450)
RBC: 3.89 x10E6/uL (ref 3.77–5.28)
RDW: 15 % (ref 11.7–15.4)
WBC: 13.7 10*3/uL — ABNORMAL HIGH (ref 3.4–10.8)

## 2020-04-19 LAB — RPR: RPR Ser Ql: NONREACTIVE

## 2020-04-19 LAB — HEMOGLOBIN A1C
Est. average glucose Bld gHb Est-mCnc: 154 mg/dL
Hgb A1c MFr Bld: 7 % — ABNORMAL HIGH (ref 4.8–5.6)

## 2020-04-19 LAB — HIV ANTIBODY (ROUTINE TESTING W REFLEX): HIV Screen 4th Generation wRfx: NONREACTIVE

## 2020-04-28 ENCOUNTER — Ambulatory Visit (INDEPENDENT_AMBULATORY_CARE_PROVIDER_SITE_OTHER): Payer: Medicaid Other

## 2020-04-28 ENCOUNTER — Other Ambulatory Visit: Payer: Self-pay

## 2020-04-28 ENCOUNTER — Ambulatory Visit (INDEPENDENT_AMBULATORY_CARE_PROVIDER_SITE_OTHER): Payer: Medicaid Other | Admitting: Obstetrics and Gynecology

## 2020-04-28 VITALS — BP 112/60 | Wt 359.0 lb

## 2020-04-28 DIAGNOSIS — O0990 Supervision of high risk pregnancy, unspecified, unspecified trimester: Secondary | ICD-10-CM

## 2020-04-28 DIAGNOSIS — O99210 Obesity complicating pregnancy, unspecified trimester: Secondary | ICD-10-CM

## 2020-04-28 DIAGNOSIS — Z3A27 27 weeks gestation of pregnancy: Secondary | ICD-10-CM

## 2020-04-28 DIAGNOSIS — O24119 Pre-existing diabetes mellitus, type 2, in pregnancy, unspecified trimester: Secondary | ICD-10-CM | POA: Diagnosis not present

## 2020-04-28 NOTE — Progress Notes (Signed)
ROB - No concerns RM 5 

## 2020-04-28 NOTE — Progress Notes (Signed)
Routine Prenatal Care Visit  Subjective  Yvonne Rios is a 29 y.o. G4P1021 at [redacted]w[redacted]d being seen today for ongoing prenatal care.  She is currently monitored for the following issues for this high-risk pregnancy and has Anxiety; Asthma; Depression; Environmental allergies; Functional constipation; GERD (gastroesophageal reflux disease); Insomnia; Left-sided low back pain with sciatica; Migraine without status migrainosus, not intractable; Mild intermittent asthma without complication; Morbid obesity due to excess calories (HCC); Myopia, left eye; Pre-existing type 2 diabetes mellitus during pregnancy, antepartum; Maternal obesity, antepartum; Supervision of high risk pregnancy, antepartum; Asthma during pregnancy; Chromosome abnormality; Attention deficit disorder (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  ----------------------------------------------------------------------------------- Patient reports no complaints.   Contractions: Not present. Vag. Bleeding: None.  Movement: Present. Denies leaking of fluid.  ----------------------------------------------------------------------------------- The following portions of the patient's history were reviewed and updated as appropriate: allergies, current medications, past family history, past medical history, past social history, past surgical history and problem list. Problem list updated.   Objective  Blood pressure 112/60, weight (!) 359 lb (162.8 kg), last menstrual period 10/18/2019. Pregravid weight 350 lb (158.8 kg) Total Weight Gain 9 lb (4.082 kg) Urinalysis:      Fetal Status: Fetal Heart Rate (bpm): 140   Movement: Present     General:  Alert, oriented and cooperative. Patient is in no acute distress.  Skin: Skin is warm and dry. No rash noted.   Cardiovascular: Normal heart rate noted  Respiratory: Normal respiratory effort, no problems with respiration noted   Abdomen: Soft, gravid, appropriate for gestational age. Pain/Pressure: Absent     Pelvic:  Cervical exam deferred        Extremities: Normal range of motion.     ental Status: Normal mood and affect. Normal behavior. Normal judgment and thought content.   US OB Follow Up  Result Date: 04/28/2020 Patient Name: Yvonne Rios DOB: February 10, 1991 MRN: [id number] ULTRASOUND REPORT Location: [facility] Date of Service: 04/28/2020 Indications:growth/afi Findings: Mason Jim intrauterine pregnancy with positive heart tones. Biometrics give an (U/S) Gestational age of [redacted]w[redacted]d and an (U/S) EDD of 07/19/2020; this correlates with the clinically established Estimated Date of Delivery: 07/22/20. Fetal presentation is Cephalic. Placenta: posterior. Grade: 2 AFI: 11.7  cm Growth percentile is 61.0%.  AC percentile is 78.4%. EFW: 1233 g  ( 2 lb 11 oz ) Impression: 1. [redacted]w[redacted]d Viable Singleton Intrauterine pregnancy previously established criteria. 2. Growth is 61.0 %ile.  AFI is 11.7 cm. Recommendations: 1.Clinical correlation with the patient's History and Physical Exam. Deanna Artis, RT There is a singleton gestation with normal amniotic fluid volume. The fetal biometry correlates with established dating.  The visualized fetal anatomy appears within normal limits within the resolution of ultrasound as described above.  It must be noted that a normal ultrasound is unable to rule out fetal aneuploidy.  Vena Austria, MD, [facility], [facility] 04/28/2020, 3:51 PM     Assessment   29 y.o. K2H0623 at [redacted]w[redacted]d by  07/22/2020, Alternate EDD Entry presenting for routine prenatal visit  Plan   FOURTH Problems (from 12/28/19 to present)    Problem Noted Resolved   Supervision of high risk pregnancy, antepartum 12/31/2019 by Vena Austria, MD No   Overview Addendum 01/25/2020 12:53 PM by Vena Austria, MD    Clinic [facility] Prenatal Labs  Dating LMP = 6week [place] Blood type:  O/Positive/-- (07/23 1457)   Genetic Screen NIPS: normal XY Fragile X  neg, CF neg, SMA neg Antibody:Negative (07/23 1457)  Anatomic [place]  Rubella: 16.40 (07/23 1457) Varicella: Immune  GTT N/A DMII, baseline HgbA1C 9.4 RPR: Non Reactive (07/23 1457)   Rhogam N/A HBsAg: Negative (07/23 1457)   TDaP vaccine                       Flu Shot: HIV: Non Reactive (07/23 1457)   Baby Food                                GBS:   Contraception  Pap: 12/28/2019 NILM  CBB     CS/VBAC N/A   Support Person Husband [name]           Previous Version   Asthma during pregnancy 12/31/2019 by Vena Austria, MD No   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 by Vena Austria, MD No   Overview Addendum 12/31/2019 10:53 PM by Vena Austria, MD    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 A2/B GDM)  For A2/B GDM or higher classes of DM [X]  Diabetes Education and Testing Supplies [X]  Nutrition Counsult [ ]  Fetal ECHO after 22-24 weeks  [ ]  Eye exam for retina evaluation  [ ]  Baseline EKG [ ]  fetal growth every 4 weeks starting at 28 weeks [ ]  Twice weekly NST starting at [redacted] weeks gestation [ ]  Delivery planning contingent on fetal growth, AFI, glycemic control, and other co-morbidities but at least by 39 weeks  Baseline and surveillance labs (pulled in from [facility], refresh links as needed)  Lab Results  Component Value Date   CREATININE 0.53 (L) 12/28/2019   AST 14 12/28/2019   ALT 14 12/28/2019   TSH 1.390 09/20/2016   Lab Results  Component Value Date   HGBA1C 9.4 (H) 12/28/2019   HGBA1C 6.6 (H) 09/20/2016    Antenatal Testing Class of DM U/S NST/AFI DELIVERY  Diabetes   A1 - good  control - O24.410    A2 - good control - O24.419      A2  - poor control or poor compliance - O24.419, E11.65   (Macrosomia or polyhydramnios) **E11.65 is extra code for poor control**    A2/B - O24.919  and B-C O24.319  Poor control B-C or D-R-F-T - O24.319  or  Type I DM - O24.019  20-38  20-38  20-24-28-32-36   20-24-28-32-35-38//fetal echo  20-24-27-30-33-36-38//fetal echo  40  32//2 x wk  32//2 x wk   32//2 x wk  28//BPP wkly then 32//2 x wk  40  39  PRN   39  PRN          Maternal obesity, antepartum 12/28/2019 by 09/22/2016, MD No   First trimester bleeding 12/31/2019 by 09/22/2016, MD 03/26/2020 by 03-08-1971, MD       Gestational age appropriate obstetric precautions including but not limited to vaginal bleeding, contractions, leaking of fluid and fetal movement were reviewed in detail with the patient.   GDM - Increase lantus to 70U AC TID and continue Humolog 15U AC TID  - overal good improvement noted  in HgbA1c down to 7.0 last visit, however BG still occasional running well out of goal   Return in about 1 week (around 05/05/2020) for 1 week ROB (weekly for the next 4 weeks).  Vena Austria, MD, [facility], [facility] 04/28/2020, 4:09 PM

## 2020-05-05 ENCOUNTER — Inpatient Hospital Stay: Admission: RE | Admit: 2020-05-05 | Payer: Medicaid Other | Source: Ambulatory Visit

## 2020-05-06 ENCOUNTER — Other Ambulatory Visit: Payer: Medicaid Other

## 2020-05-07 DIAGNOSIS — Z20822 Contact with and (suspected) exposure to covid-19: Secondary | ICD-10-CM | POA: Diagnosis not present

## 2020-05-07 DIAGNOSIS — Z419 Encounter for procedure for purposes other than remedying health state, unspecified: Secondary | ICD-10-CM | POA: Diagnosis not present

## 2020-05-08 ENCOUNTER — Encounter: Payer: Medicaid Other | Admitting: Obstetrics and Gynecology

## 2020-05-14 DIAGNOSIS — J301 Allergic rhinitis due to pollen: Secondary | ICD-10-CM | POA: Diagnosis not present

## 2020-05-16 ENCOUNTER — Observation Stay
Admission: EM | Admit: 2020-05-16 | Discharge: 2020-05-18 | DRG: 833 | Disposition: A | Discharge status: Home / Self Care | Payer: Medicaid Other | Attending: Obstetrics and Gynecology | Admitting: Obstetrics and Gynecology

## 2020-05-16 ENCOUNTER — Encounter: Payer: Self-pay | Admitting: Obstetrics and Gynecology

## 2020-05-16 ENCOUNTER — Ambulatory Visit (INDEPENDENT_AMBULATORY_CARE_PROVIDER_SITE_OTHER): Payer: Medicaid Other | Admitting: Obstetrics and Gynecology

## 2020-05-16 ENCOUNTER — Other Ambulatory Visit: Payer: Self-pay

## 2020-05-16 VITALS — BP 118/82 | Wt 361.0 lb

## 2020-05-16 DIAGNOSIS — O0990 Supervision of high risk pregnancy, unspecified, unspecified trimester: Secondary | ICD-10-CM

## 2020-05-16 DIAGNOSIS — Z20822 Contact with and (suspected) exposure to covid-19: Secondary | ICD-10-CM | POA: Diagnosis not present

## 2020-05-16 DIAGNOSIS — O99210 Obesity complicating pregnancy, unspecified trimester: Secondary | ICD-10-CM

## 2020-05-16 DIAGNOSIS — J45909 Unspecified asthma, uncomplicated: Secondary | ICD-10-CM

## 2020-05-16 DIAGNOSIS — E1165 Type 2 diabetes mellitus with hyperglycemia: Secondary | ICD-10-CM

## 2020-05-16 DIAGNOSIS — Z794 Long term (current) use of insulin: Secondary | ICD-10-CM

## 2020-05-16 DIAGNOSIS — O24113 Pre-existing diabetes mellitus, type 2, in pregnancy, third trimester: Principal | ICD-10-CM | POA: Diagnosis present

## 2020-05-16 DIAGNOSIS — O0993 Supervision of high risk pregnancy, unspecified, third trimester: Secondary | ICD-10-CM | POA: Diagnosis not present

## 2020-05-16 DIAGNOSIS — O24119 Pre-existing diabetes mellitus, type 2, in pregnancy, unspecified trimester: Secondary | ICD-10-CM

## 2020-05-16 DIAGNOSIS — Z3A30 30 weeks gestation of pregnancy: Secondary | ICD-10-CM | POA: Diagnosis not present

## 2020-05-16 DIAGNOSIS — O99519 Diseases of the respiratory system complicating pregnancy, unspecified trimester: Secondary | ICD-10-CM

## 2020-05-16 LAB — URINALYSIS, COMPLETE (UACMP) WITH MICROSCOPIC
Bilirubin Urine: NEGATIVE
Glucose, UA: >=500 mg/dL — AB
Hgb urine dipstick: NEGATIVE
Ketones, ur: NEGATIVE mg/dL
Leukocytes,Ua: NEGATIVE
Nitrite: NEGATIVE
Protein, ur: NEGATIVE mg/dL
Specific Gravity, Urine: 1.007 (ref 1.005–1.030)
pH: 6 (ref 5.0–8.0)

## 2020-05-16 LAB — COMPREHENSIVE METABOLIC PANEL
ALT: 9 U/L (ref 0–44)
AST: 16 U/L (ref 15–41)
Albumin: 2.8 g/dL — ABNORMAL LOW (ref 3.5–5.0)
Alkaline Phosphatase: 90 U/L (ref 38–126)
Anion gap: 11 (ref 5–15)
BUN: 5 mg/dL — ABNORMAL LOW (ref 6–20)
CO2: 21 mmol/L — ABNORMAL LOW (ref 22–32)
Calcium: 8.5 mg/dL — ABNORMAL LOW (ref 8.9–10.3)
Chloride: 104 mmol/L (ref 98–111)
Creatinine, Ser: 0.58 mg/dL (ref 0.44–1.00)
GFR, Estimated: >60 mL/min (ref >60)
Glucose, Bld: 190 mg/dL — ABNORMAL HIGH (ref 70–99)
Potassium: 3.6 mmol/L (ref 3.5–5.1)
Sodium: 136 mmol/L (ref 135–145)
Total Bilirubin: 0.5 mg/dL (ref 0.3–1.2)
Total Protein: 6.5 g/dL (ref 6.5–8.1)

## 2020-05-16 LAB — POCT URINALYSIS DIPSTICK OB

## 2020-05-16 LAB — BETA-HYDROXYBUTYRIC ACID: Beta-Hydroxybutyric Acid: 0.08 mmol/L (ref 0.05–0.27)

## 2020-05-16 LAB — CBC
HCT: 32.8 % — ABNORMAL LOW (ref 36.0–46.0)
Hemoglobin: 10.5 g/dL — ABNORMAL LOW (ref 12.0–15.0)
MCH: 26.9 pg (ref 26.0–34.0)
MCHC: 32 g/dL (ref 30.0–36.0)
MCV: 83.9 fL (ref 80.0–100.0)
Platelets: 248 10*3/uL (ref 150–400)
RBC: 3.91 MIL/uL (ref 3.87–5.11)
RDW: 15.9 % — ABNORMAL HIGH (ref 11.5–15.5)
WBC: 14 10*3/uL — ABNORMAL HIGH (ref 4.0–10.5)
nRBC: 0 % (ref 0.0–0.2)

## 2020-05-16 LAB — GLUCOSE, CAPILLARY
Glucose-Capillary: 185 mg/dL — ABNORMAL HIGH (ref 70–99)
Glucose-Capillary: 189 mg/dL — ABNORMAL HIGH (ref 70–99)
Glucose-Capillary: 137 mg/dL — ABNORMAL HIGH (ref 70–99)

## 2020-05-16 MED ORDER — ACETAMINOPHEN 325 MG PO TABS
650.0000 mg | ORAL_TABLET | ORAL | Status: DC | PRN
  Administered 2020-05-17 – 2020-05-18 (×2): 650 mg via ORAL
  Filled 2020-05-16 (×2): qty 2

## 2020-05-16 MED ORDER — MONTELUKAST SODIUM 10 MG PO TABS
10.0000 mg | ORAL_TABLET | Freq: Every day | ORAL | Status: DC
  Administered 2020-05-16 – 2020-05-17 (×2): 10 mg via ORAL
  Filled 2020-05-16 (×3): qty 1

## 2020-05-16 MED ORDER — LACTATED RINGERS IV SOLN: INTRAVENOUS | Status: DC

## 2020-05-16 MED ORDER — INSULIN ASPART 100 UNIT/ML ~~LOC~~ SOLN
0.0000 [IU] | Freq: Three times a day (TID) | SUBCUTANEOUS | Status: DC
  Administered 2020-05-16 – 2020-05-17 (×3): 4 [IU] via SUBCUTANEOUS
  Administered 2020-05-17: 12:00:00 9 [IU] via SUBCUTANEOUS
  Administered 2020-05-18 (×2): 4 [IU] via SUBCUTANEOUS
  Filled 2020-05-16 (×6): qty 1

## 2020-05-16 MED ORDER — MOMETASONE FURO-FORMOTEROL FUM 200-5 MCG/ACT IN AERO
2.0000 | INHALATION_SPRAY | Freq: Two times a day (BID) | RESPIRATORY_TRACT | Status: DC
  Administered 2020-05-16 – 2020-05-18 (×4): 2 via RESPIRATORY_TRACT
  Filled 2020-05-16: qty 8.8

## 2020-05-16 MED ORDER — ASPIRIN EC 81 MG PO TBEC
81.0000 mg | DELAYED_RELEASE_TABLET | Freq: Every day | ORAL | Status: DC
  Administered 2020-05-17 – 2020-05-18 (×2): 81 mg via ORAL
  Filled 2020-05-16 (×3): qty 1

## 2020-05-16 MED ORDER — INSULIN REGULAR HUMAN (CONC) 500 UNIT/ML ~~LOC~~ SOPN
15.0000 [IU] | PEN_INJECTOR | Freq: Three times a day (TID) | SUBCUTANEOUS | Status: DC
  Administered 2020-05-16 – 2020-05-17 (×3): 15 [IU] via SUBCUTANEOUS
  Filled 2020-05-16: qty 3

## 2020-05-16 MED ORDER — ALBUTEROL SULFATE HFA 108 (90 BASE) MCG/ACT IN AERS
2.0000 | INHALATION_SPRAY | RESPIRATORY_TRACT | Status: DC | PRN
  Filled 2020-05-16: qty 6.7

## 2020-05-16 MED ORDER — INSULIN GLARGINE 100 UNIT/ML ~~LOC~~ SOLN
70.0000 [IU] | Freq: Every day | SUBCUTANEOUS | Status: DC
  Administered 2020-05-16: 70 [IU] via SUBCUTANEOUS
  Filled 2020-05-16 (×2): qty 0.7

## 2020-05-16 MED ORDER — SERTRALINE HCL 25 MG PO TABS
50.0000 mg | ORAL_TABLET | Freq: Every day | ORAL | Status: DC
  Administered 2020-05-17 – 2020-05-18 (×2): 50 mg via ORAL
  Filled 2020-05-16 (×2): qty 2
  Filled 2020-05-16: qty 1

## 2020-05-16 MED ORDER — INSULIN REGULAR(HUMAN) IN NACL 100-0.9 UT/100ML-% IV SOLN
INTRAVENOUS | Status: DC
  Filled 2020-05-16: qty 100

## 2020-05-16 MED ORDER — AMPHETAMINE-DEXTROAMPHET ER 5 MG PO CP24
20.0000 mg | ORAL_CAPSULE | Freq: Every day | ORAL | Status: DC
  Administered 2020-05-17 – 2020-05-18 (×2): 20 mg via ORAL
  Filled 2020-05-16 (×2): qty 4

## 2020-05-16 MED ORDER — DEXTROSE IN LACTATED RINGERS 5 % IV SOLN: INTRAVENOUS | Status: DC

## 2020-05-16 MED ORDER — INSULIN REGULAR HUMAN (CONC) 500 UNIT/ML ~~LOC~~ SOPN
15.0000 [IU] | PEN_INJECTOR | Freq: Three times a day (TID) | SUBCUTANEOUS | Status: DC
  Filled 2020-05-16: qty 3

## 2020-05-16 MED ORDER — FOLIC ACID 1 MG PO TABS
1.0000 mg | ORAL_TABLET | Freq: Every day | ORAL | Status: DC
  Administered 2020-05-17 – 2020-05-18 (×2): 1 mg via ORAL
  Filled 2020-05-16 (×2): qty 1

## 2020-05-16 MED ORDER — DEXTROSE 50 % IV SOLN: 0.0000 mL | INTRAVENOUS | Status: DC | PRN

## 2020-05-16 NOTE — Plan of Care (Signed)
RD consulted for nutrition education regarding diet for GDM.  Lab Results  Component Value Date   HGBA1C 7.0 (H) 04/18/2020    29 y.o. [id number] 40w3dadmitted for glycemic control/GDM.  Met with patient at bedside. Patient's spouse also present during end of assessment/education. Patient reports her appetite is good and she is able to eat well at meals. She reports typically eating 2 meals per day. She reports she may have hamburger or spaghetti with fruits and vegetables. She reports she eats salad regularly. She reports drinking soda, sweet tea, orange juice, and water. Patient does endorse she has met with many RDs. Most recently noted patient has met with outpatient RD on 03/19/2020. She reports she learned about portion sizes for carbohydrate foods and limiting sugar-sweetened beverages. Patient reports she can only limit these and cannot completely stop drinking. She reports she does not like the diet or zero sugar versions and does not like other non-sweetened beverages she has tried. RD strongly encouraged avoiding sugar-sweetened beverages and other simple carbohydrates.  RD provided "Gestational Diabetes Nutrition Therapy" handout from the Academy of Nutrition and Dietetics. Discussed different food groups and their effects on blood sugar, emphasizing carbohydrate-containing foods. Provided list of carbohydrates and recommended serving sizes of common foods.  Discussed importance of controlled and consistent carbohydrate intake throughout the day. Provided examples of ways to balance meals/snacks and encouraged intake of high-fiber, whole grain complex carbohydrates. Teach back method used.  Recommended Nutrition Prescription for GDM: Breakfast: 30-35 grams carbohydrates Morning Snack: 30 grams carbohydrates Lunch: 45-50 grams carbohydrates Afternoon Snack: 30 grams carbohydrates Dinner: 45-50 grams of carbohydrates Evening Snack: 30 grams of carbohydrates A minimum of 175 grams of  carbohydrates is needed daily in pregnancy. Also discussed that the lower carbohydrate intake (30-35 grams) is suggested at breakfast because carbohydrate is generally less well tolerated at breakfast than at other meals during pregnancy.  Expect fair compliance.  Current diet order is gestational carbohydrate modified. Patient was just placing her diet order before RD entered room so she has not yet received tray. Labs and medications reviewed. No further nutrition interventions warranted at this time. RD contact information provided. If additional nutrition issues arise, please re-consult RD.  LJacklynn Barnacle MS, RD, LDN Pager number available on Amion

## 2020-05-16 NOTE — Progress Notes (Signed)
ROB - No concerns. RM 6

## 2020-05-16 NOTE — Progress Notes (Signed)
Routine Prenatal Care Visit  Subjective  Yvonne Rios is a 29 y.o. [id number] at [redacted]w[redacted]d being seen today for ongoing prenatal care.  She is currently monitored for the following issues for this high-risk pregnancy and has Anxiety; Asthma; Depression; Environmental allergies; Functional constipation; GERD (gastroesophageal reflux disease); Insomnia; Left-sided low back pain with sciatica; Migraine without status migrainosus, not intractable; Mild intermittent asthma without complication; Morbid obesity due to excess calories (HCC); Myopia, left eye; Pre-existing type 2 diabetes mellitus during pregnancy, antepartum; Maternal obesity, antepartum; Supervision of high risk pregnancy, antepartum; Asthma during pregnancy; Chromosome abnormality; Attention deficit disorder (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  ----------------------------------------------------------------------------------- Patient reports no complaints.    .  .   . Denies leaking of fluid.  ----------------------------------------------------------------------------------- The following portions of the patient's history were reviewed and updated as appropriate: allergies, current medications, past family history, past medical history, past social history, past surgical history and problem list. Problem list updated.   Objective  Blood pressure 118/82, weight (!) 361 lb (163.7 kg), last menstrual period 10/18/2019. Pregravid weight 350 lb (158.8 kg) Total Weight Gain 11 lb (4.99 kg)   Urinalysis:      Fetal Status:           General:  Alert, oriented and cooperative. Patient is in no acute distress.  Skin: Skin is warm and dry. No rash noted.   Cardiovascular: Normal heart rate noted  Respiratory: Normal respiratory effort, no problems with respiration noted  Abdomen: Soft, gravid, appropriate for gestational age.       Pelvic:  Cervical exam deferred         Extremities: Normal range of motion.     ental Status: Normal mood and affect. Normal behavior. Normal judgment and thought content.     Assessment   29 y.o. [id number] at [redacted]w[redacted]d by  07/22/2020, Alternate EDD Entry presenting for routine prenatal visit  Plan   FOURTH Problems (from 12/28/19 to present)    Problem Noted Resolved   Supervision of high risk pregnancy, antepartum 12/31/2019 by Vena Austria, MD No   Overview Addendum 01/25/2020 12:53 PM by Vena Austria, MD    Clinic [facility] Prenatal Labs  Dating LMP = 6week [place] Blood type: O/Positive/-- (07/23 1457)   Genetic Screen NIPS: normal XY Fragile X neg, CF neg, SMA neg Antibody:Negative (07/23 1457)  Anatomic [place]  Rubella: 16.40 (07/23 1457) Varicella: Immune  GTT N/A DMII, baseline HgbA1C 9.4 RPR: Non Reactive (07/23 1457)   Rhogam N/A HBsAg: Negative (07/23 1457)   TDaP vaccine                       Flu Shot: HIV: Non Reactive (07/23 1457)   Baby Food                                GBS:   Contraception  Pap: 12/28/2019 NILM  CBB     CS/VBAC N/A   Support Person Husband [name]           Previous Version   Asthma during pregnancy 12/31/2019 by Vena Austria, MD No   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 by Vena Austria, MD No   Overview Addendum 12/31/2019 10:53 PM by Vena Austria, MD    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 A2/B GDM)  For A2/B GDM or higher classes of DM [X]  Diabetes Education and Testing Supplies [X]  Nutrition Counsult [ ]  Fetal ECHO after 22-24 weeks  [ ]  Eye exam for retina evaluation  [ ]  Baseline EKG [ ]  fetal growth every 4 weeks starting at 28 weeks [ ]  Twice  weekly NST starting at [redacted] weeks gestation [ ]  Delivery planning contingent on fetal growth, AFI, glycemic control, and other co-morbidities but at least by 39 weeks  Baseline and surveillance labs (pulled in from [facility], refresh links as needed)  Lab Results  Component Value Date   CREATININE 0.53 (L) 12/28/2019   AST 14 12/28/2019   ALT 14 12/28/2019   TSH 1.390 09/20/2016   Lab Results  Component Value Date   HGBA1C 9.4 (H) 12/28/2019   HGBA1C 6.6 (H) 09/20/2016    Antenatal Testing Class of DM U/S NST/AFI DELIVERY  Diabetes   A1 - good control - O24.410    A2 - good control - O24.419      A2  - poor control or poor compliance - O24.419, E11.65   (Macrosomia or polyhydramnios) **E11.65 is extra code for poor control**    A2/B - O24.919  and B-C O24.319  Poor control B-C or D-R-F-T - O24.319  or  Type I DM - O24.019  20-38  20-38  20-24-28-32-36   20-24-28-32-35-38//fetal echo  20-24-27-30-33-36-38//fetal echo  40  32//2 x wk  32//2 x wk   32//2 x wk  28//BPP wkly then 32//2 x wk  40  39  PRN   39  PRN          Maternal obesity, antepartum 12/28/2019 by 09/22/2016, MD No   First trimester bleeding 12/31/2019 by 09/22/2016, MD 03/26/2020 by 03-08-1971, MD       Gestational age appropriate obstetric precautions including but not limited to vaginal bleeding, contractions, leaking of fluid and fetal movement were reviewed in detail with the patient.    Reports significantly elevated BG values over the past 2 week since she last was seen.  Was unable to come for last week visit because of URI, negative COVID testing.  Reports that despite increasing Lantus to 70 units last visit her fasting have now been consistently around 130 with postprandials as high as 260.    Return in about 1 week (around 05/23/2020).  05-28-1983, MD, 09-04-2004 [facility], [facility] 05/16/2020, 11:44 AM

## 2020-05-16 NOTE — OB Triage Note (Signed)
Pt presents to L&D after office visit for labs and CBG monitoring. FHM and TOCO applied and explained to pt. Plan to monitor fetal and maternal well being and assess lab work. Pt denies contractions, bleeding, leaking of fluid, and reports good fetal movement.

## 2020-05-16 NOTE — Progress Notes (Addendum)
ADA Standards of Care 2021 Diabetes in Pregnancy Target Glucose Ranges:  Fasting: 60 - 90 mg/dL Preprandial: 60 - 342 mg/dL 1 hr postprandial: Less than 140mg /dL (from first bite of meal) 2 hr postprandial: Less than 120 mg/dL (from first bit of meal)  Note admit for glucose management.  CBG=185 mg/dL on admit.  Per patient she takes:  Lantus 70 units q HS, U500 20 units tid with meals (just increased today)  She states that blood sugars recently have been around 250 mg/dl 2 hours post-prandial.  We discussed goal blood sugars during pregnancy.  She was aware that 2 hour post-prandial blood sugars should be less than 120 mg/dL.   Briefly discussed gestational diet including the elimination of sugar from beverages and juices.  Patient admits that she is drinking sodas and juices.  Advised her to stop drinking sugar in beverages.  We also briefly discussed Gestational CHO modified diet and the importance of reducing simple CHO in meals.  Have asked nutritionist to also see patient while in the hospital.  Per RN, awaiting labs.  If no acidosis, consider resuming home medications with recent changes (see above).  Also recommend adding Novolog correction via Diabetes and pregnancy order set fasting and 2 hours post-prandial (Novolog 0-16 units).  Will discuss with Dr. .    Thanks  Bonney Aid, RN, BC-ADM Inpatient Diabetes Coordinator Pager [phone]- Spoke with patient and husband at bedside.  Briefly discussed gestational DM and dangers to both baby and mom.  Gave them Gestational DM booklet as well.  RN to call Dr. (2M-3T)    [id number] with lab results.  Patient states she is in the process of getting Dexcom Sensor for monitoring.

## 2020-05-16 NOTE — H&P (Signed)
Obstetric H&P   Chief Complaint: Elevated home BG reading  Prenatal Care Provider: [facility]  History of Present Illness: 29 y.o. [id number] 21w3dby 07/22/2020, Alternate EDD Entry presenting to L&D for BG control.  The patient pregnancy has been complicated by DMII newly diagnosed prior to pregnancy.  HgbA1C at the onset of pregnancy was 9.4, recheck at 28 week was 7.0.  The patient has required escalating insulin doses with currently Lantus 70U at bedtime and Humolog 15U AC tid.  Despite this the patient reports increasing BG in the past 2 weeks with fasting around 130 and postprandial's as high as 260.  She did have a recent URI, negative COVID testing.  On presentation to clinic today also had 2+ glucose on urine dip which was new.  Based on this patient recommended to come to L&D for evaluation of possible insulin drip and management of home regimen.  +FM, no LOF, no VB, some irregular contractions.    Pregravid weight 158.8 kg Total Weight Gain 4.99 kg  FOURTH Problems (from 12/28/19 to present)    Problem Noted Resolved   Supervision of high risk pregnancy, antepartum 12/31/2019 by SMalachy Mood MD No   Overview Addendum 01/25/2020 12:53 PM by SMalachy Mood MD    [facility] Prenatal Labs  Dating LMP = 6week [place] type: O/Positive/-- (07/23 1457)   Genetic Screen NIPS: normal XY Fragile X neg, CF neg, SMA neg Antibody:Negative (07/23 1457)  Anatomic [place] Rubella: 16.40 (07/23 1457) Varicella: Immune  GTT N/A DMII, baseline HgbA1C 9.4 RPR: Non Reactive (07/23 1457)   Rhogam N/A HBsAg: Negative (07/23 1457)   TDaP vaccine                       Flu Shot: HIV: Non Reactive (07/23 1457)   Baby Food                                GBS:   Contraception  Pap: 12/28/2019 NILM  CBB     CS/VBAC N/A   Support Person Husband [name]           Previous Version   Asthma during pregnancy 12/31/2019 by SMalachy Mood MD No   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 by SMalachy Mood  MD No   Overview Addendum 12/31/2019 10:53 PM by SMalachy Mood MD    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 A2/B GDM)  For A2/B GDM or higher classes of DM _1  Diabetes Education and Testing Supplies _2  Nutrition Counsult _3  Fetal ECHO after 22-24 weeks  _4  Eye exam for retina evaluation  _5  Baseline EKG _6  UKoreafetal growth every 4 weeks starting at 28 weeks _7  Twice weekly NST starting at [redacted] weeks gestation _8  Delivery planning contingent on fetal growth, AFI, glycemic control, and other co-morbidities but at least by 39 weeks  [facility] and [facility] labs [facility]  Lab Results  Component Value Date   CREATININE 0.53 (L) 12/28/2019   AST 14 12/28/2019   ALT 14 12/28/2019  TSH 1.390 09/20/2016   Lab Results  Component Value Date   HGBA1C 9.4 (H) 12/28/2019   HGBA1C 6.6 (H) 09/20/2016    Antenatal Testing Class of DM U/S NST/AFI DELIVERY  Diabetes   A1 - good control - O24.410    A2 - good control - O24.419      A2  - poor control or poor compliance - O24.419, E11.65   (Macrosomia or polyhydramnios) **E11.65 is extra code for poor control**    A2/B - O24.919  and B-C O24.319  Poor control B-C or D-R-F-T - O24.319  or  Type I DM - O24.019  20-38  20-38  20-24-28-32-36   20-24-28-32-35-38//fetal echo  20-24-27-30-33-36-38//fetal echo  40  32//2 x wk  32//2 x wk   32//2 x wk  28//BPP wkly then 32//2 x wk  40  39  PRN   39  PRN          Maternal obesity, antepartum 12/28/2019 by Malachy Mood, MD No   First trimester bleeding 12/31/2019 by Malachy Mood, MD 03/26/2020 by  Malachy Mood, MD       Review of Systems: 10 point review of systems negative unless otherwise noted in HPI  Past Medical History: Patient Active Problem List   Diagnosis Date Noted  . Pre-existing type 2 diabetes mellitus with hyperglycemia during pregnancy in third trimester ([place]) 05/16/2020  . Uncontrolled type 2 diabetes mellitus with hyperglycemia ([address]) 04/04/2020  . Supervision of high risk pregnancy, antepartum 12/31/2019    [facility] Prenatal Labs  Dating LMP = 6week [place] Blood type: O/Positive/-- (07/23 1457)   Genetic Screen NIPS: normal XY Fragile X neg, CF neg, SMA neg Antibody:Negative (07/23 1457)  Anatomic [place]  Rubella: 16.40 (07/23 1457) Varicella: Immune  GTT N/A DMII, baseline HgbA1C 9.4 RPR: Non Reactive (07/23 1457)   Rhogam N/A HBsAg: Negative (07/23 1457)   TDaP vaccine                       Flu Shot: HIV: Non Reactive (07/23 1457)   Baby Food                                GBS:   Contraception  Pap: 12/28/2019 NILM  CBB     CS/VBAC N/A   Support Person Husband [name]        . Asthma during pregnancy 12/31/2019  . 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 Pre-existing type 2 diabetes mellitus during pregnancy, antepartum 12/28/2019    Current Diabetic Medications:  Insulin  _0  Aspirin 81 mg daily after 12 weeks; discontinue after 36 weeks (? A2/B GDM)  For A2/B GDM or higher classes of DM _1  Diabetes Education and Testing Supplies _2  Nutrition Counsult _3  Fetal ECHO after 22-24 weeks  _4  Eye exam for retina evaluation  _5  Baseline EKG _6  US fetal growth every 4 weeks starting at 28 weeks _7  Twice weekly NST starting at [redacted] weeks gestation _8  Delivery planning contingent on fetal growth, AFI, glycemic control, and other co-morbidities but at least by 39 weeks  Baseline and surveillance labs (pulled in from [facility], refresh links as needed)  Lab Results  Component Value Date   CREATININE 0.53  (L) 12/28/2019   AST 14 12/28/2019   ALT 14 12/28/2019   TSH 1.390 09/20/2016   Lab Results  Component Value Date   HGBA1C 9.4 (H) 12/28/2019   HGBA1C 6.6 (H) 09/20/2016    Antenatal Testing Class of DM U/S NST/AFI DELIVERY  Diabetes   A1 - good control - O24.410    A2 - good control - O24.419      A2  - poor control or poor compliance - O24.419, E11.65   (Macrosomia or polyhydramnios) **E11.65 is extra code for poor control**    A2/B - O24.919  and B-C O24.319  Poor control B-C or D-R-F-T - O24.319  or  Type I DM - O24.019  20-38  20-38  20-24-28-32-36   20-24-28-32-35-38//fetal echo  20-24-27-30-33-36-38//fetal echo  40  32//2 x wk  32//2 x wk   32//2 x wk  28//BPP wkly then 32//2 x wk  40  39  PRN   39  PRN       . Maternal obesity, antepartum 12/28/2019  . Attention deficit disorder (ADD) in adult 09/28/2019  . Myopia, left eye 12/07/2017    Last Assessment & Plan:  - Continue without glasses   . Migraine without status migrainosus, not intractable 11/19/2015  . Morbid obesity due to excess calories ([place]) 11/19/2015  . Environmental allergies 03/17/2015  . Asthma 11/21/2014  . Mild intermittent asthma without complication 57/32/2025  . Left-sided low back pain with sciatica 08/09/2014  . Insomnia 11/20/2013  . 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 2016   . GERD (gastroesophageal reflux disease) 07/03/2013  . Anxiety 05/16/2013  . Depression 05/16/2013    Past Surgical History: Past Surgical History:  Procedure Laterality Date  . COLPOSCOPY    . DILATION AND CURETTAGE OF UTERUS  age 73  . HYSTEROSCOPY  2015  . TONSILLECTOMY    . WISDOM TOOTH EXTRACTION      Past Obstetric History: # 1 - Date: None, Sex: None, Weight: None, GA: None, Delivery: None, Apgar1: None, Apgar5: None, Living: None, Birth Comments: None  # 2 - Date: None, Sex: None, Weight: None, GA: None, Delivery: None,  Apgar1: None, Apgar5: None, Living: None, Birth Comments: None  # 3 - Date: 08/14/12, Sex: Female, Weight: 3118 g, GA: None, Delivery: Vaginal, Spontaneous, Apgar1: None, Apgar5: None, Living: Living, Birth Comments: None  # 4 - Date: None, Sex: None, Weight: None, GA: None, Delivery: None, Apgar1: None, Apgar5: None, Living: None, Birth Comments: None   Past Gynecologic History:  Family History: Family History  Problem Relation Age of Onset  . Ovarian cancer Mother 65  . Other Father     Social History: Social History   Socioeconomic History  . Marital status: Married    Spouse name: Dwayne  . Number of children: Not on file  . Years of education: Not on file  . Highest education level: Not on file  Occupational History  . Occupation: [employer]  Tobacco Use  . Smoking status: Former Research scientist (life sciences)  . Smokeless tobacco: Never Used  . Tobacco comment: smoked as teen  Vaping Use  . Vaping Use: Never used  Substance and Sexual Activity  . Alcohol use: Not Currently    Alcohol/week: 0.0 standard drinks    Comment: rarely  . Drug use: No  . Sexual activity: Yes    Partners: Male    Birth control/protection: None  Other Topics Concern  . Not on file  Social History Narrative  . Not on file   Social Determinants of Health   Financial Resource Strain: Not on file  Food  Insecurity: Not on file  Transportation Needs: Not on file  Physical Activity: Not on file  Stress: Not on file  Social Connections: Not on file  Intimate Partner Violence: Not on file    Medications: Prior to Admission medications   Medication Sig Start Date End Date Taking? Authorizing Provider  ADDERALL XR 20 MG 24 hr capsule Take 1 capsule (20 mg total) by mouth 2 (two) times daily. 04/17/20  Yes Malachy Mood, MD  albuterol (PROVENTIL HFA;VENTOLIN HFA) 108 (90 Base) MCG/ACT inhaler Inhale 2 puffs into the lungs every 4 (four) hours as needed for wheezing or shortness of breath. 05/01/17  Yes  Melynda Ripple, MD  aspirin EC 81 MG tablet Take 1 tablet (81 mg total) by mouth daily. Take after 12 weeks for prevention of preeclampssia later in pregnancy 01/25/20  Yes Malachy Mood, MD  cetirizine (ZYRTEC) 10 MG tablet Take 1 tablet (10 mg total) by mouth daily. 12/28/19 12/28/20 Yes Malachy Mood, MD  clonazePAM (KLONOPIN) 1 MG tablet TAKE 1/2 TABLET BY MOUTH EVERY MORNING, TAKE 1/2 TABLET BY MOUTH EVERY AFTERNOON & TAKE ONE TABLET BY MOUTH AT BEDTIME 12/28/19  Yes Malachy Mood, MD  cyclobenzaprine (FLEXERIL) 10 MG tablet Take 1 tablet (10 mg total) by mouth 3 (three) times daily as needed for muscle spasms. 01/25/20  Yes Malachy Mood, MD  famotidine (PEPCID) 20 MG tablet Take 1 tablet (20 mg total) by mouth daily. 12/28/19 12/27/20 Yes Malachy Mood, MD  Fluticasone-Salmeterol (ADVAIR) 250-50 MCG/DOSE AEPB Inhale 1 puff into the lungs daily. 12/28/19  Yes Malachy Mood, MD  folic acid (FOLVITE) 1 MG tablet Take 1 tablet (1 mg total) by mouth daily. 01/25/20  Yes Malachy Mood, MD  insulin glargine (LANTUS) 100 UNIT/ML Solostar Pen Inject 60 Units into the skin at bedtime. 04/04/20  Yes Malachy Mood, MD  insulin regular human CONCENTRATED (HUMULIN R U-500 KWIKPEN) 500 UNIT/ML kwikpen Inject 15 Units into the skin 3 (three) times daily before meals. 04/04/20  Yes Malachy Mood, MD  montelukast (SINGULAIR) 10 MG tablet Take 1 tablet (10 mg total) by mouth at bedtime. 12/28/19 12/27/20 Yes Malachy Mood, MD  pantoprazole (PROTONIX) 40 MG tablet Take 1 tablet (40 mg total) by mouth daily. 12/28/19 12/27/20 Yes Malachy Mood, MD  sertraline (ZOLOFT) 50 MG tablet Take 1 tablet (50 mg total) by mouth daily. 12/28/19  Yes Malachy Mood, MD  SYMBICORT 160-4.5 MCG/ACT inhaler Inhale 2 puffs into the lungs in the morning and at bedtime. 12/05/19  Yes [provider]  Accu-Chek Softclix Lancets lancets 1 each by Other route 4 (four) times daily. Use as  instructed 01/07/20   Malachy Mood, MD  ALLERGIST TRAY 1CC 27GX3/8" 27G X 3/8" 1 ML KIT USE AS DIRECTED PER MD 02/15/20   [provider]  azithromycin (ZITHROMAX) 250 MG tablet 2 tablets on day 1, then 1 tablet daily on days 2-5. Patient not taking: No sig reported 04/03/20   Coral Spikes, DO  Blood Glucose Monitoring Suppl (ACCU-CHEK AVIVA PLUS) w/Device KIT 1 Units by Does not apply route in the morning, at noon, in the evening, and at bedtime. 01/07/20   Malachy Mood, MD  EPINEPHrine 0.3 mg/0.3 mL IJ SOAJ injection USE AS DIRECTED PER MD FOR ANAPHYLAXIS Patient not taking: Reported on 05/16/2020 06/09/19   [provider]  glucose blood test strip Use as instructed 01/07/20   Malachy Mood, MD  Insulin Pen Needle (NOVOFINE) 32G X 6 MM MISC 1 Units by Does not apply route  in the morning, at noon, in the evening, and at bedtime. 01/07/20   Malachy Mood, MD  Spacer/Aero-Holding Chambers (AEROCHAMBER PLUS) inhaler Use as instructed 05/01/17   Melynda Ripple, MD    Allergies: Allergies  Allergen Reactions  . Cefprozil Other (See Comments)  . Amoxil [Amoxicillin] Rash  . Augmentin [Amoxicillin-Pot Clavulanate] Rash    Physical Exam: Vitals: Blood pressure 119/69, pulse 86, temperature 98.3 F (36.8 C), temperature source Oral, resp. rate 18, height _0  (1.727 m), weight (!) 163.7 kg, last menstrual period 10/18/2019.  FHT: 130, moderate, +accels, no decels Toco: none  General: NAD HEENT: normocephalic, anicteric Pulmonary: No increased work of breathing Cardiovascular: RRR, distal pulses 2+ Abdomen: Gravid, non-tender Genitourinary: deferred Extremities: no edema, erythema, or tenderness Neurologic: Grossly intact Psychiatric: mood appropriate, affect full  Labs: Results for orders placed or performed during the hospital encounter of 05/16/20 (from the past 24 hour(s))  Glucose, capillary     Status: Abnormal   Collection Time: 05/16/20  2:50 PM   Result Value Ref Range   Glucose-Capillary 185 (H) 70 - 99 mg/dL  Comprehensive metabolic panel     Status: Abnormal   Collection Time: 05/16/20  3:02 PM  Result Value Ref Range   Sodium 136 135 - 145 mmol/L   Potassium 3.6 3.5 - 5.1 mmol/L   Chloride 104 98 - 111 mmol/L   CO2 21 (L) 22 - 32 mmol/L   Glucose, Bld 190 (H) 70 - 99 mg/dL   BUN 5 (L) 6 - 20 mg/dL   Creatinine, Ser 0.58 0.44 - 1.00 mg/dL   Calcium 8.5 (L) 8.9 - 10.3 mg/dL   Total Protein 6.5 6.5 - 8.1 g/dL   Albumin 2.8 (L) 3.5 - 5.0 g/dL   AST 16 15 - 41 U/L   ALT 9 0 - 44 U/L   Alkaline Phosphatase 90 38 - 126 U/L   Total Bilirubin 0.5 0.3 - 1.2 mg/dL   GFR, Estimated >60 >60 mL/min   Anion gap 11 5 - 15  CBC on admission     Status: Abnormal   Collection Time: 05/16/20  3:02 PM  Result Value Ref Range   WBC 14.0 (H) 4.0 - 10.5 K/uL   RBC 3.91 3.87 - 5.11 MIL/uL   Hemoglobin 10.5 (L) 12.0 - 15.0 g/dL   HCT 32.8 (L) 36.0 - 46.0 %   MCV 83.9 80.0 - 100.0 fL   MCH 26.9 26.0 - 34.0 pg   MCHC 32.0 30.0 - 36.0 g/dL   RDW 15.9 (H) 11.5 - 15.5 %   Platelets 248 150 - 400 K/uL   nRBC 0.0 0.0 - 0.2 %  Beta-hydroxybutyric acid     Status: None   Collection Time: 05/16/20  3:02 PM  Result Value Ref Range   Beta-Hydroxybutyric Acid 0.08 0.05 - 0.27 mmol/L  Urinalysis, Complete w Microscopic Urine, Clean Catch     Status: Abnormal   Collection Time: 05/16/20  3:14 PM  Result Value Ref Range   Color, Urine STRAW (A) YELLOW   APPearance CLEAR (A) CLEAR   Specific Gravity, Urine 1.007 1.005 - 1.030   pH 6.0 5.0 - 8.0   Glucose, UA >=500 (A) NEGATIVE mg/dL   Hgb urine dipstick NEGATIVE NEGATIVE   Bilirubin Urine NEGATIVE NEGATIVE   Ketones, ur NEGATIVE NEGATIVE mg/dL   Protein, ur NEGATIVE NEGATIVE mg/dL   Nitrite NEGATIVE NEGATIVE   Leukocytes,Ua NEGATIVE NEGATIVE   RBC / HPF 0-5 0 - 5 RBC/hpf   WBC,  UA 0-5 0 - 5 WBC/hpf   Bacteria, UA RARE (A) NONE SEEN   Squamous Epithelial / LPF 0-5 0 - 5  Glucose, capillary      Status: Abnormal   Collection Time: 05/16/20  4:24 PM  Result Value Ref Range   Glucose-Capillary 189 (H) 70 - 99 mg/dL   Comment 1 Document in Chart    Comment 2 Call MD NNP PA CNM     Assessment: 29 y.o. [id number] 40w3dby 07/22/2020, Alternate EDD Entry presenting to office with 2+ glucose on UA and reporting elevated home BG readings  Plan: 1) GDM - first BG here 190, negative for DKA based on anion gap, negative ketones on UA, and Beta-Hydroxybutyric acid.  Had anticipated need for possible insulin drip but will have patient do her home dose of insulin with sliding scale coverage and make adjustments to her home insulin dosing based on the required sliding scale coverage.  2) Fetus - cat I tracing  3) PNL - Blood type O/Positive/-- (07/23 1457) / Anti-bodyscreen Negative (07/23 1457) / Rubella 16.40 (07/23 1457) / Varicella Immune / RPR Non Reactive (11/12 1543) / HBsAg Negative (07/23 1457) / HIV Non Reactive (11/12 1543) / / unknown 4) Immunization History -  Immunization History  Administered Date(s) Administered  . Influenza,inj,Quad PF,6+ Mos 06/26/2014, 03/19/2015, 03/11/2016, 03/16/2017, 02/20/2018  . Influenza,inj,quad, With Preservative 03/20/2020  . Influenza-Unspecified 04/09/2019  . PPD Test 04/09/2016  . Pneumococcal Polysaccharide-23 01/09/2013  . Tdap 01/20/2018  . Varicella 01/20/2018, 07/19/2018    5) Disposition - pending glucose adjustments  AMalachy Mood MD, FIndian HillsGroup 05/16/2020, 4:51 PM

## 2020-05-17 DIAGNOSIS — O24113 Pre-existing diabetes mellitus, type 2, in pregnancy, third trimester: Secondary | ICD-10-CM

## 2020-05-17 DIAGNOSIS — E1165 Type 2 diabetes mellitus with hyperglycemia: Secondary | ICD-10-CM | POA: Diagnosis not present

## 2020-05-17 DIAGNOSIS — Z3A30 30 weeks gestation of pregnancy: Secondary | ICD-10-CM

## 2020-05-17 DIAGNOSIS — Z794 Long term (current) use of insulin: Secondary | ICD-10-CM | POA: Diagnosis not present

## 2020-05-17 DIAGNOSIS — O0993 Supervision of high risk pregnancy, unspecified, third trimester: Secondary | ICD-10-CM | POA: Diagnosis not present

## 2020-05-17 DIAGNOSIS — Z20822 Contact with and (suspected) exposure to covid-19: Secondary | ICD-10-CM | POA: Diagnosis not present

## 2020-05-17 LAB — BASIC METABOLIC PANEL
Anion gap: 11 (ref 5–15)
BUN: 6 mg/dL (ref 6–20)
CO2: 21 mmol/L — ABNORMAL LOW (ref 22–32)
Calcium: 8.6 mg/dL — ABNORMAL LOW (ref 8.9–10.3)
Chloride: 104 mmol/L (ref 98–111)
Creatinine, Ser: 0.48 mg/dL (ref 0.44–1.00)
GFR, Estimated: >60 mL/min (ref >60)
Glucose, Bld: 126 mg/dL — ABNORMAL HIGH (ref 70–99)
Potassium: 3.9 mmol/L (ref 3.5–5.1)
Sodium: 136 mmol/L (ref 135–145)

## 2020-05-17 LAB — RESP PANEL BY RT-PCR (FLU A&B, COVID) ARPGX2
Influenza A by PCR: NEGATIVE
Influenza B by PCR: NEGATIVE
SARS Coronavirus 2 by RT PCR: NEGATIVE

## 2020-05-17 LAB — GLUCOSE, CAPILLARY
Glucose-Capillary: 104 mg/dL — ABNORMAL HIGH (ref 70–99)
Glucose-Capillary: 203 mg/dL — ABNORMAL HIGH (ref 70–99)
Glucose-Capillary: 126 mg/dL — ABNORMAL HIGH (ref 70–99)
Glucose-Capillary: 150 mg/dL — ABNORMAL HIGH (ref 70–99)

## 2020-05-17 MED ORDER — INSULIN GLARGINE 100 UNIT/ML ~~LOC~~ SOLN
75.0000 [IU] | Freq: Every day | SUBCUTANEOUS | Status: DC
  Administered 2020-05-17: 75 [IU] via SUBCUTANEOUS
  Filled 2020-05-17 (×3): qty 0.75

## 2020-05-17 MED ORDER — INSULIN REGULAR HUMAN (CONC) 500 UNIT/ML ~~LOC~~ SOPN
20.0000 [IU] | PEN_INJECTOR | Freq: Three times a day (TID) | SUBCUTANEOUS | Status: DC
  Administered 2020-05-17 – 2020-05-18 (×3): 20 [IU] via SUBCUTANEOUS
  Filled 2020-05-17: qty 3

## 2020-05-17 MED ORDER — CYCLOBENZAPRINE HCL 10 MG PO TABS
10.0000 mg | ORAL_TABLET | Freq: Three times a day (TID) | ORAL | Status: DC | PRN
  Administered 2020-05-17 (×2): 10 mg via ORAL
  Filled 2020-05-17 (×3): qty 1

## 2020-05-17 NOTE — Progress Notes (Signed)
Subjective:  She is feeling well. She reports that she was hungry overnight and feels like her current diet is not representative of how she would eat at home. She reports normal fetal movement. She denies leakage of fluid. She denies vaginal bleeding.  She denies contractions.   Objective:   Blood pressure (!) 115/50, pulse (!) 57, temperature 98.3 F (36.8 C), temperature source Oral, resp. rate 18, height 5' 8" (1.727 m), weight (!) 163.7 kg, last menstrual period 10/18/2019, SpO2 99 %.  General: NAD Pulmonary: no increased work of breathing Abdomen: non-distended, non-tender Extremities: no edema, no erythema, no tenderness, no signs of DVT  Results for orders placed or performed during the hospital encounter of 05/16/20 (from the past 72 hour(s))  Glucose, capillary     Status: Abnormal   Collection Time: 05/16/20  2:50 PM  Result Value Ref Range   Glucose-Capillary 185 (H) 70 - 99 mg/dL    Comment: Glucose reference range applies only to samples taken after fasting for at least 8 hours.  Comprehensive metabolic panel     Status: Abnormal   Collection Time: 05/16/20  3:02 PM  Result Value Ref Range   Sodium 136 135 - 145 mmol/L   Potassium 3.6 3.5 - 5.1 mmol/L   Chloride 104 98 - 111 mmol/L   CO2 21 (L) 22 - 32 mmol/L   Glucose, Bld 190 (H) 70 - 99 mg/dL    Comment: Glucose reference range applies only to samples taken after fasting for at least 8 hours.   BUN 5 (L) 6 - 20 mg/dL   Creatinine, Ser 0.58 0.44 - 1.00 mg/dL   Calcium 8.5 (L) 8.9 - 10.3 mg/dL   Total Protein 6.5 6.5 - 8.1 g/dL   Albumin 2.8 (L) 3.5 - 5.0 g/dL   AST 16 15 - 41 U/L   ALT 9 0 - 44 U/L   Alkaline Phosphatase 90 38 - 126 U/L   Total Bilirubin 0.5 0.3 - 1.2 mg/dL   GFR, Estimated >60 >60 mL/min    Comment: (NOTE) Calculated using the CKD-EPI Creatinine Equation (2021)    Anion gap 11 5 - 15    Comment: Performed at [facility], [address] [place], [place] [address]  CBC on  admission     Status: Abnormal   Collection Time: 05/16/20  3:02 PM  Result Value Ref Range   WBC 14.0 (H) 4.0 - 10.5 K/uL   RBC 3.91 3.87 - 5.11 MIL/uL   Hemoglobin 10.5 (L) 12.0 - 15.0 g/dL   HCT 32.8 (L) 36.0 - 46.0 %   MCV 83.9 80.0 - 100.0 fL   MCH 26.9 26.0 - 34.0 pg   MCHC 32.0 30.0 - 36.0 g/dL   RDW 15.9 (H) 11.5 - 15.5 %   Platelets 248 150 - 400 K/uL   nRBC 0.0 0.0 - 0.2 %    Comment: Performed at [facility], [address] [place], [place] [address]  Beta-hydroxybutyric acid     Status: None   Collection Time: 05/16/20  3:02 PM  Result Value Ref Range   Beta-Hydroxybutyric Acid 0.08 0.05 - 0.27 mmol/L    Comment: Performed at [facility], [address] [place], [place] [address]  Urinalysis, Complete w Microscopic Urine, Clean Catch     Status: Abnormal   Collection Time: 05/16/20  3:14 PM  Result Value Ref Range   Color, Urine STRAW (A) YELLOW   APPearance CLEAR (A) CLEAR   Specific Gravity,  Urine 1.007 1.005 - 1.030   pH 6.0 5.0 - 8.0   Glucose, UA >=500 (A) NEGATIVE mg/dL   Hgb urine dipstick NEGATIVE NEGATIVE   Bilirubin Urine NEGATIVE NEGATIVE   Ketones, ur NEGATIVE NEGATIVE mg/dL   Protein, ur NEGATIVE NEGATIVE mg/dL   Nitrite NEGATIVE NEGATIVE   Leukocytes,Ua NEGATIVE NEGATIVE   RBC / HPF 0-5 0 - 5 RBC/hpf   WBC, UA 0-5 0 - 5 WBC/hpf   Bacteria, UA RARE (A) NONE SEEN   Squamous Epithelial / LPF 0-5 0 - 5    Comment: Performed at [facility], [address] [place], [place] [address]  Glucose, capillary     Status: Abnormal   Collection Time: 05/16/20  4:24 PM  Result Value Ref Range   Glucose-Capillary 189 (H) 70 - 99 mg/dL    Comment: Glucose reference range applies only to samples taken after fasting for at least 8 hours.   Comment 1 Document in Chart    Comment 2 Call MD NNP PA CNM   Glucose, capillary     Status: Abnormal   Collection Time: 05/16/20  8:52 PM  Result Value Ref Range   Glucose-Capillary 137 (H)  70 - 99 mg/dL    Comment: Glucose reference range applies only to samples taken after fasting for at least 8 hours.  Basic metabolic panel     Status: Abnormal   Collection Time: 05/17/20  5:53 AM  Result Value Ref Range   Sodium 136 135 - 145 mmol/L   Potassium 3.9 3.5 - 5.1 mmol/L   Chloride 104 98 - 111 mmol/L   CO2 21 (L) 22 - 32 mmol/L   Glucose, Bld 126 (H) 70 - 99 mg/dL    Comment: Glucose reference range applies only to samples taken after fasting for at least 8 hours.   BUN 6 6 - 20 mg/dL   Creatinine, Ser 0.48 0.44 - 1.00 mg/dL   Calcium 8.6 (L) 8.9 - 10.3 mg/dL   GFR, Estimated >60 >60 mL/min    Comment: (NOTE) Calculated using the CKD-EPI Creatinine Equation (2021)    Anion gap 11 5 - 15    Comment: Performed at [facility], [address] [place], [place] [address]  Glucose, capillary     Status: Abnormal   Collection Time: 05/17/20  5:57 AM  Result Value Ref Range   Glucose-Capillary 104 (H) 70 - 99 mg/dL    Comment: Glucose reference range applies only to samples taken after fasting for at least 8 hours.  Resp Panel by RT-PCR (Flu A&B, Covid) Nasopharyngeal Swab     Status: None   Collection Time: 05/17/20  8:06 AM   Specimen: Nasopharyngeal Swab; Nasopharyngeal(NP) swabs in vial transport medium  Result Value Ref Range   SARS Coronavirus 2 by RT PCR NEGATIVE NEGATIVE    Comment: (NOTE) SARS-CoV-2 target nucleic acids are NOT DETECTED.  The SARS-CoV-2 RNA is generally detectable in upper respiratory specimens during the acute phase of infection. The lowest concentration of SARS-CoV-2 viral copies this assay can detect is 138 copies/mL. A negative result does not preclude SARS-Cov-2 infection and should not be used as the sole basis for treatment or other patient management decisions. A negative result may occur with  improper specimen collection/handling, submission of specimen other than nasopharyngeal swab, presence of viral mutation(s) within  the areas targeted by this assay, and inadequate number of viral copies(<138 copies/mL). A negative result must be combined with clinical observations, patient history, and epidemiological  information. The expected result is Negative.  Fact Sheet for Patients:  EntrepreneurPulse.com.au  Fact Sheet for Healthcare Providers:  IncredibleEmployment.be  This test is no t yet approved or cleared by the [place] FDA and  has been authorized for detection and/or diagnosis of SARS-CoV-2 by FDA under an Emergency Use Authorization (EUA). This EUA will remain  in effect (meaning this test can be used) for the duration of the COVID-19 declaration under Section 564(b)(1) of the Act, 21 U.S.C.section 360bbb-3(b)(1), unless the authorization is terminated  or revoked sooner.       Influenza A by PCR NEGATIVE NEGATIVE   Influenza B by PCR NEGATIVE NEGATIVE    Comment: (NOTE) The Xpert Xpress SARS-CoV-2/FLU/RSV plus assay is intended as an aid in the diagnosis of influenza from Nasopharyngeal swab specimens and should not be used as a sole basis for treatment. Nasal washings and aspirates are unacceptable for Xpert Xpress SARS-CoV-2/FLU/RSV testing.  Fact Sheet for Patients: EntrepreneurPulse.com.au  Fact Sheet for Healthcare Providers: IncredibleEmployment.be  This test is not yet approved or cleared by the [place] FDA and has been authorized for detection and/or diagnosis of SARS-CoV-2 by FDA under an Emergency Use Authorization (EUA). This EUA will remain in effect (meaning this test can be used) for the duration of the COVID-19 declaration under Section 564(b)(1) of the Act, 21 U.S.C. section 360bbb-3(b)(1), unless the authorization is terminated or revoked.  Performed at [facility], [address], [place], [place] [address]     Assessment:   29 y.o. [id number] HD#2  Plan:   1) Type 2  diabetic -  Blood glucose levels have improved this admission, but she is eating differently than at home. Will speak with diabetes coordinator regarding plan for insulin adjustments to home medications.  Patient has met with nutrition but is not very open to dietary adjustments.   2) Fetal monitoring: doppler check once a day   3) DVT prophylaxis, SCDs and encourage ambulation   4) Disposition - Continue care at this time.   Adrian Prows MD [facility], [facility] 05/17/2020 11:10 AM

## 2020-05-17 NOTE — Progress Notes (Signed)
ADA Standards of Care 2019 Diabetes in Pregnancy Target Glucose Ranges:  Fasting: 60 - 90 mg/dL Preprandial: 60 - 992 mg/dL 1 hr postprandial: Less than 140mg /dL (from first bite of meal) 2 hr postprandial: Less than 120 mg/dL (from first bit of meal)  Results for Yvonne Rios, Yvonne Rios (MRN Budd Palmer) as of 05/17/2020 12:53  Ref. Range 05/16/2020 14:50 05/16/2020 16:24 05/16/2020 20:52  Glucose-Capillary Latest Ref Range: 70 - 99 mg/dL 14/03/2020 (H) 222 (H)  15 units U500 Regular Insulin @5 :45pm 137 (H)  4 units NOVOLOG  70 units LANTUS @10 :23pm   Results for Yvonne Rios, Yvonne Rios (MRN ) as of 05/17/2020 12:53  Ref. Range 05/17/2020 05:57 05/17/2020 12:11  Glucose-Capillary Latest Ref Range: 70 - 99 mg/dL 14/04/2020 (H)  15 units 14/04/2020 Regular Insulin @8 :58am 203 (H)  9 units NOVOLOG  15 units U500 Regular Insulin       Home DM Meds: Lantus 70 units q HS       U500 20 units tid with meals (just increased yesterday per pt report)   Current Orders: Lantus 70 units QHS      Novolog 0-24 units TID 2 hours post-meals      Regular U500 Insulin 15 units TID with meals     MD- Since Fasting CBG was 104 this AM and 2-hour post-prandial CBG was 203 after breakfast, recommend the following:  1. Increase Lantus to 75 units QHS  2. Increase the Regular U500 Insulin with meals to 18 units TID (per pt report, was told to increase her Regular Insulin to 20 units TID with meals at her OB visit on 12/10)    --Will follow patient during hospitalization--  408 RN, MSN, CDE Diabetes Coordinator Inpatient Glycemic Control Team Team Pager: [phone] (8a-5p)

## 2020-05-17 NOTE — Progress Notes (Signed)
FHT assessed via doppler, 125bpm with accelerations to 135.

## 2020-05-18 DIAGNOSIS — O0993 Supervision of high risk pregnancy, unspecified, third trimester: Secondary | ICD-10-CM | POA: Diagnosis not present

## 2020-05-18 DIAGNOSIS — E1165 Type 2 diabetes mellitus with hyperglycemia: Secondary | ICD-10-CM | POA: Diagnosis not present

## 2020-05-18 DIAGNOSIS — O24113 Pre-existing diabetes mellitus, type 2, in pregnancy, third trimester: Secondary | ICD-10-CM | POA: Diagnosis not present

## 2020-05-18 DIAGNOSIS — Z794 Long term (current) use of insulin: Secondary | ICD-10-CM | POA: Diagnosis not present

## 2020-05-18 DIAGNOSIS — Z3A30 30 weeks gestation of pregnancy: Secondary | ICD-10-CM | POA: Diagnosis not present

## 2020-05-18 DIAGNOSIS — Z20822 Contact with and (suspected) exposure to covid-19: Secondary | ICD-10-CM | POA: Diagnosis not present

## 2020-05-18 LAB — BASIC METABOLIC PANEL WITH GFR
Anion gap: 8 (ref 5–15)
BUN: 7 mg/dL (ref 6–20)
CO2: 24 mmol/L (ref 22–32)
Calcium: 8.7 mg/dL — ABNORMAL LOW (ref 8.9–10.3)
Chloride: 105 mmol/L (ref 98–111)
Creatinine, Ser: 0.58 mg/dL (ref 0.44–1.00)
GFR, Estimated: >60 mL/min
Glucose, Bld: 111 mg/dL — ABNORMAL HIGH (ref 70–99)
Potassium: 3.8 mmol/L (ref 3.5–5.1)
Sodium: 137 mmol/L (ref 135–145)

## 2020-05-18 LAB — GLUCOSE, CAPILLARY
Glucose-Capillary: 99 mg/dL (ref 70–99)
Glucose-Capillary: 157 mg/dL — ABNORMAL HIGH (ref 70–99)
Glucose-Capillary: 146 mg/dL — ABNORMAL HIGH (ref 70–99)

## 2020-05-18 MED ORDER — INSULIN GLARGINE 100 UNIT/ML SOLOSTAR PEN: 75.0000 [IU] | PEN_INJECTOR | Freq: Every day | SUBCUTANEOUS | 11 refills | Status: DC

## 2020-05-18 MED ORDER — HUMULIN R U-500 KWIKPEN 500 UNIT/ML ~~LOC~~ SOPN: 25.0000 [IU] | PEN_INJECTOR | Freq: Three times a day (TID) | SUBCUTANEOUS | 6 refills | Status: DC

## 2020-05-18 MED ORDER — INSULIN REGULAR HUMAN (CONC) 500 UNIT/ML ~~LOC~~ SOPN
25.0000 [IU] | PEN_INJECTOR | Freq: Three times a day (TID) | SUBCUTANEOUS | Status: DC
  Administered 2020-05-18: 19:00:00 25 [IU] via SUBCUTANEOUS
  Filled 2020-05-18: qty 3

## 2020-05-18 MED ORDER — BUTALBITAL-APAP-CAFFEINE 50-325-40 MG PO TABS
1.0000 | ORAL_TABLET | Freq: Four times a day (QID) | ORAL | Status: DC | PRN
  Administered 2020-05-18: 1 via ORAL

## 2020-05-18 NOTE — Progress Notes (Signed)
Subjective:  She is feeling well. She reports normal fetal movement. Se denies contractions. She denies bleeding. She denies leakage of fluid. She feels ready to go home.  Objective:   Blood pressure 108/60, pulse 63, temperature 98.5 F (36.9 C), temperature source Oral, resp. rate 20, height 5\' 8"  (1.727 m), weight (!) 163.7 kg, last menstrual period 10/18/2019, SpO2 97 %.  General: NAD Pulmonary: no increased work of breathing Abdomen: non-distended, non-tender Extremities: no edema, no erythema, no tenderness, no signs of DVT  Results for orders placed or performed during the hospital encounter of 05/16/20 (from the past 72 hour(s))  Glucose, capillary     Status: Abnormal   Collection Time: 05/16/20  2:50 PM  Result Value Ref Range   Glucose-Capillary 185 (H) 70 - 99 mg/dL    Comment: Glucose reference range applies only to samples taken after fasting for at least 8 hours.  Comprehensive metabolic panel     Status: Abnormal   Collection Time: 05/16/20  3:02 PM  Result Value Ref Range   Sodium 136 135 - 145 mmol/L   Potassium 3.6 3.5 - 5.1 mmol/L   Chloride 104 98 - 111 mmol/L   CO2 21 (L) 22 - 32 mmol/L   Glucose, Bld 190 (H) 70 - 99 mg/dL    Comment: Glucose reference range applies only to samples taken after fasting for at least 8 hours.   BUN 5 (L) 6 - 20 mg/dL   Creatinine, Ser 14/10/21 0.44 - 1.00 mg/dL   Calcium 8.5 (L) 8.9 - 10.3 mg/dL   Total Protein 6.5 6.5 - 8.1 g/dL   Albumin 2.8 (L) 3.5 - 5.0 g/dL   AST 16 15 - 41 U/L   ALT 9 0 - 44 U/L   Alkaline Phosphatase 90 38 - 126 U/L   Total Bilirubin 0.5 0.3 - 1.2 mg/dL   GFR, Estimated 1.44 >81 mL/min    Comment: (NOTE) Calculated using the CKD-EPI Creatinine Equation (2021)    Anion gap 11 5 - 15    Comment: Performed at [facility], [address] [place], [place]  CBC on admission     Status: Abnormal   Collection Time: 05/16/20  3:02 PM  Result Value Ref Range   WBC 14.0 (H) 4.0 - 10.5  K/uL   RBC 3.91 3.87 - 5.11 MIL/uL   Hemoglobin 10.5 (L) 12.0 - 15.0 g/dL   HCT 14/10/21 (L) 70.2 - 63.7 %   MCV 83.9 80.0 - 100.0 fL   MCH 26.9 26.0 - 34.0 pg   MCHC 32.0 30.0 - 36.0 g/dL   RDW 85.8 (H) 85.0 - 27.7 %   Platelets 248 150 - 400 K/uL   nRBC 0.0 0.0 - 0.2 %    Comment: Performed at [facility], [address] [place], [place]  Beta-hydroxybutyric acid     Status: None   Collection Time: 05/16/20  3:02 PM  Result Value Ref Range   Beta-Hydroxybutyric Acid 0.08 0.05 - 0.27 mmol/L    Comment: Performed at [facility], [address] [place], [place]  Urinalysis, Complete w Microscopic Urine, Clean Catch     Status: Abnormal   Collection Time: 05/16/20  3:14 PM  Result Value Ref Range   Color, Urine STRAW (A) YELLOW   APPearance CLEAR (A) CLEAR   Specific Gravity, Urine 1.007 1.005 - 1.030   pH 6.0 5.0 - 8.0   Glucose, UA >=500 (A) NEGATIVE mg/dL  Hgb urine dipstick NEGATIVE NEGATIVE   Bilirubin Urine NEGATIVE NEGATIVE   Ketones, ur NEGATIVE NEGATIVE mg/dL   Protein, ur NEGATIVE NEGATIVE mg/dL   Nitrite NEGATIVE NEGATIVE   Leukocytes,Ua NEGATIVE NEGATIVE   RBC / HPF 0-5 0 - 5 RBC/hpf   WBC, UA 0-5 0 - 5 WBC/hpf   Bacteria, UA RARE (A) NONE SEEN   Squamous Epithelial / LPF 0-5 0 - 5    Comment: Performed at [facility], [address] [place], [place] [address]  Glucose, capillary     Status: Abnormal   Collection Time: 05/16/20  4:24 PM  Result Value Ref Range   Glucose-Capillary 189 (H) 70 - 99 mg/dL    Comment: Glucose reference range applies only to samples taken after fasting for at least 8 hours.   Comment 1 Document in Chart    Comment 2 Call MD NNP PA CNM   Glucose, capillary     Status: Abnormal   Collection Time: 05/16/20  8:52 PM  Result Value Ref Range   Glucose-Capillary 137 (H) 70 - 99 mg/dL    Comment: Glucose reference range applies only to samples taken after fasting for at least 8 hours.   Basic metabolic panel     Status: Abnormal   Collection Time: 05/17/20  5:53 AM  Result Value Ref Range   Sodium 136 135 - 145 mmol/L   Potassium 3.9 3.5 - 5.1 mmol/L   Chloride 104 98 - 111 mmol/L   CO2 21 (L) 22 - 32 mmol/L   Glucose, Bld 126 (H) 70 - 99 mg/dL    Comment: Glucose reference range applies only to samples taken after fasting for at least 8 hours.   BUN 6 6 - 20 mg/dL   Creatinine, Ser 2.53 0.44 - 1.00 mg/dL   Calcium 8.6 (L) 8.9 - 10.3 mg/dL   GFR, Estimated >66 >44 mL/min    Comment: (NOTE) Calculated using the CKD-EPI Creatinine Equation (2021)    Anion gap 11 5 - 15    Comment: Performed at [facility], [address] [place], [place] [address]  Glucose, capillary     Status: Abnormal   Collection Time: 05/17/20  5:57 AM  Result Value Ref Range   Glucose-Capillary 104 (H) 70 - 99 mg/dL    Comment: Glucose reference range applies only to samples taken after fasting for at least 8 hours.  Resp Panel by RT-PCR (Flu A&B, Covid) Nasopharyngeal Swab     Status: None   Collection Time: 05/17/20  8:06 AM   Specimen: Nasopharyngeal Swab; Nasopharyngeal(NP) swabs in vial transport medium  Result Value Ref Range   SARS Coronavirus 2 by RT PCR NEGATIVE NEGATIVE    Comment: (NOTE) SARS-CoV-2 target nucleic acids are NOT DETECTED.  The SARS-CoV-2 RNA is generally detectable in upper respiratory specimens during the acute phase of infection. The lowest concentration of SARS-CoV-2 viral copies this assay can detect is 138 copies/mL. A negative result does not preclude SARS-Cov-2 infection and should not be used as the sole basis for treatment or other patient management decisions. A negative result may occur with  improper specimen collection/handling, submission of specimen other than nasopharyngeal swab, presence of viral mutation(s) within the areas targeted by this assay, and inadequate number of viral copies(<138 copies/mL). A negative result must be  combined with clinical observations, patient history, and epidemiological information. The expected result is Negative.  Fact Sheet for Patients:  BloggerCourse.com  Fact Sheet for Healthcare Providers:  SeriousBroker.it  This test is no t yet approved or cleared by the [place] FDA and  has been authorized for detection and/or diagnosis of SARS-CoV-2 by FDA under an Emergency Use Authorization (EUA). This EUA will remain  in effect (meaning this test can be used) for the duration of the COVID-19 declaration under Section 564(b)(1) of the Act, 21 U.S.C.section 360bbb-3(b)(1), unless the authorization is terminated  or revoked sooner.       Influenza A by PCR NEGATIVE NEGATIVE   Influenza B by PCR NEGATIVE NEGATIVE    Comment: (NOTE) The Xpert Xpress SARS-CoV-2/FLU/RSV plus assay is intended as an aid in the diagnosis of influenza from Nasopharyngeal swab specimens and should not be used as a sole basis for treatment. Nasal washings and aspirates are unacceptable for Xpert Xpress SARS-CoV-2/FLU/RSV testing.  Fact Sheet for Patients: BloggerCourse.comhttps://www.fda.gov/media/152166/download  Fact Sheet for Healthcare Providers: SeriousBroker.ithttps://www.fda.gov/media/152162/download  This test is not yet approved or cleared by the [place] FDA and has been authorized for detection and/or diagnosis of SARS-CoV-2 by FDA under an Emergency Use Authorization (EUA). This EUA will remain in effect (meaning this test can be used) for the duration of the COVID-19 declaration under Section 564(b)(1) of the Act, 21 U.S.C. section 360bbb-3(b)(1), unless the authorization is terminated or revoked.  Performed at [facility], [address] [place], [place] [address]   Glucose, capillary     Status: Abnormal   Collection Time: 05/17/20 12:11 PM  Result Value Ref Range   Glucose-Capillary 203 (H) 70 - 99 mg/dL    Comment: Glucose reference  range applies only to samples taken after fasting for at least 8 hours.  Glucose, capillary     Status: Abnormal   Collection Time: 05/17/20  5:05 PM  Result Value Ref Range   Glucose-Capillary 126 (H) 70 - 99 mg/dL    Comment: Glucose reference range applies only to samples taken after fasting for at least 8 hours.  Glucose, capillary     Status: Abnormal   Collection Time: 05/17/20 10:09 PM  Result Value Ref Range   Glucose-Capillary 150 (H) 70 - 99 mg/dL    Comment: Glucose reference range applies only to samples taken after fasting for at least 8 hours.  Glucose, capillary     Status: None   Collection Time: 05/18/20  5:51 AM  Result Value Ref Range   Glucose-Capillary 99 70 - 99 mg/dL    Comment: Glucose reference range applies only to samples taken after fasting for at least 8 hours.  Basic metabolic panel     Status: Abnormal   Collection Time: 05/18/20  6:37 AM  Result Value Ref Range   Sodium 137 135 - 145 mmol/L   Potassium 3.8 3.5 - 5.1 mmol/L   Chloride 105 98 - 111 mmol/L   CO2 24 22 - 32 mmol/L   Glucose, Bld 111 (H) 70 - 99 mg/dL    Comment: Glucose reference range applies only to samples taken after fasting for at least 8 hours.   BUN 7 6 - 20 mg/dL   Creatinine, Ser 4.090.58 0.44 - 1.00 mg/dL   Calcium 8.7 (L) 8.9 - 10.3 mg/dL   GFR, Estimated >81>60 >19>60 mL/min    Comment: (NOTE) Calculated using the CKD-EPI Creatinine Equation (2021)    Anion gap 8 5 - 15    Comment: Performed at [facility], [address] [place], [place] [address]    Assessment:   29 y.o. [id number] hospital day # 3  Plan:  1) Type 2 diabetic -  Blood glucose levels have improved this morning. Will follow up with diabetes coordinator regarding plan home insulin.  2) Fetal monitoring: doppler check once a day   3) DVT prophylaxis, SCDs and encourage ambulation   4) Disposition - Hopefully discharge home this afternoon.    Adelene Idler MD [facility], [facility] 05/18/2020 9:39 AM

## 2020-05-18 NOTE — Progress Notes (Signed)
Patient would like to be discharged home today. She has child care issues and needs to pick up her son. She also reports that she is out of clothing and that Dr. Bonney Aid, "said I would only be here for 24 hours."  She has an appointment to follow up with Dr. Bonney Aid later this week which she will keep.  Discussed that we had increased her basal insulin to 75 units of Lantus and her meal units to 25 units of the U500 regular insulin.   Will discharge patient home.   Adelene Idler MD, [facility], [facility] 05/18/2020 6:22 PM

## 2020-05-18 NOTE — Discharge Summary (Signed)
Physician Discharge Summary  Patient ID: Yvonne Rios MRN: [id number] DOB/AGE: Oct 27, 1990 29 y.o.  Admit date: 05/16/2020 Discharge date: 05/18/2020  Admission Diagnoses: Supervision high risk pregnancy  [redacted] weeks gestation Type 2 diabetes with hyperglycemia during pregnancy third trimester  Discharge Diagnoses:  Active Problems:   Pre-existing type 2 diabetes mellitus with hyperglycemia during pregnancy in third trimester [facility]) Supervision high risk pregnancy  [redacted] weeks gestation  Discharged Condition: good  Hospital Course: Patient was admitted for insulin adjustment. She was seen by nutrition and the diabetes care management nurse was consulted.  Blood glucose levels improved, but did not meet goal. Patient requested discharge because of childcare needs.   Consults: None  Significant Diagnostic Studies: labs: see EPCI  Treatments: insulin: regular and Lantus  Discharge Exam: Blood pressure 108/60, pulse 63, temperature 98.5 F (36.9 C), temperature source Oral, resp. rate 20, height _0  (1.727 m), weight (!) 163.7 kg, last menstrual period 10/18/2019, SpO2 97 %. General appearance: alert and cooperative Cardio: regular rate and rhythm, S1, S2 normal, no murmur, click, rub or gallop GI: soft, non-tender; bowel sounds normal; no masses,  no organomegaly Skin: Skin color, texture, turgor normal. No rashes or lesions  Disposition: Discharge disposition: 01-Home or Self Care        Allergies as of 05/18/2020      Reactions   Cefprozil Other (See Comments)   Amoxil [amoxicillin] Rash   Augmentin [amoxicillin-pot Clavulanate] Rash      Medication List    TAKE these medications   Accu-Chek Aviva Plus w/Device Kit 1 Units by Does not apply route in the morning, at noon, in the evening, and at bedtime.   Accu-Chek Softclix Lancets lancets 1 each by Other route 4 (four) times daily. Use as instructed   Adderall XR 20 MG 24 hr capsule Generic drug:  amphetamine-dextroamphetamine Take 1 capsule (20 mg total) by mouth 2 (two) times daily.   AeroChamber Plus inhaler Use as instructed   albuterol 108 (90 Base) MCG/ACT inhaler Commonly known as: VENTOLIN HFA Inhale 2 puffs into the lungs every 4 (four) hours as needed for wheezing or shortness of breath.   ALLERGIST TRAY 1CC 27GX3/8" 27G X 3/8" 1 ML Kit Generic drug: Tuberculin-Allergy Syringes USE AS DIRECTED PER MD   aspirin EC 81 MG tablet Take 1 tablet (81 mg total) by mouth daily. Take after 12 weeks for prevention of preeclampssia later in pregnancy   azithromycin 250 MG tablet Commonly known as: ZITHROMAX 2 tablets on day 1, then 1 tablet daily on days 2-5.   cetirizine 10 MG tablet Commonly known as: ZYRTEC Take 1 tablet (10 mg total) by mouth daily.   clonazePAM 1 MG tablet Commonly known as: KLONOPIN TAKE 1/2 TABLET BY MOUTH EVERY MORNING, TAKE 1/2 TABLET BY MOUTH EVERY AFTERNOON & TAKE ONE TABLET BY MOUTH AT BEDTIME   cyclobenzaprine 10 MG tablet Commonly known as: FLEXERIL Take 1 tablet (10 mg total) by mouth 3 (three) times daily as needed for muscle spasms.   EPINEPHrine 0.3 mg/0.3 mL Soaj injection Commonly known as: EPI-PEN USE AS DIRECTED PER MD FOR ANAPHYLAXIS   famotidine 20 MG tablet Commonly known as: PEPCID Take 1 tablet (20 mg total) by mouth daily.   Fluticasone-Salmeterol 250-50 MCG/DOSE Aepb Commonly known as: ADVAIR Inhale 1 puff into the lungs daily.   folic acid 1 MG tablet Commonly known as: FOLVITE Take 1 tablet (1 mg total) by mouth daily.   glucose blood test strip Use as instructed  HumuLIN R U-500 KwikPen 500 UNIT/ML kwikpen Generic drug: insulin regular human CONCENTRATED Inject 25 Units into the skin 3 (three) times daily before meals. What changed: how much to take   insulin glargine 100 UNIT/ML Solostar Pen Commonly known as: LANTUS Inject 75 Units into the skin at bedtime. What changed: how much to take    montelukast 10 MG tablet Commonly known as: SINGULAIR Take 1 tablet (10 mg total) by mouth at bedtime.   NovoFine 32G X 6 MM Misc Generic drug: Insulin Pen Needle 1 Units by Does not apply route in the morning, at noon, in the evening, and at bedtime.   pantoprazole 40 MG tablet Commonly known as: PROTONIX Take 1 tablet (40 mg total) by mouth daily.   sertraline 50 MG tablet Commonly known as: Zoloft Take 1 tablet (50 mg total) by mouth daily.   Symbicort 160-4.5 MCG/ACT inhaler Generic drug: budesonide-formoterol Inhale 2 puffs into the lungs in the morning and at bedtime.        Signed: Homero Fellers 05/18/2020, 6:24 PM

## 2020-05-18 NOTE — Progress Notes (Signed)
DC instructions given at this time

## 2020-05-18 NOTE — Progress Notes (Addendum)
ADA Standards of Care 2019 Diabetes in Pregnancy Target Glucose Ranges:  Fasting: 60 - 90 mg/dL Preprandial: 60 - 323 mg/dL 1 hr postprandial: Less than 140mg /dL (from first bite of meal) 2 hr postprandial: Less than 120 mg/dL (from first bit of meal)  Results for ANDORA, KRULL (MRN Budd Palmer) as of 05/18/2020 09:06  Ref. Range 05/17/2020 05:57 05/17/2020 12:11 05/17/2020 17:05 05/17/2020 22:09  Glucose-Capillary Latest Ref Range: 70 - 99 mg/dL 14/04/2020 (H)  15 units 427 Insulin @8 :58am 203 (H)  9 units NOVOLOG  15 units U500 Insulin @12 :31pm 126 (H)  4 units NOVOLOG  20 units U500 Insulin @6 :17pm 150 (H)  4 units NOVOLOG  75 units LANTUS @11 :38pm    Results for ILA, LANDOWSKI (MRN ) as of 05/18/2020 09:06  Ref. Range 05/18/2020 05:51  Glucose-Capillary Latest Ref Range: 70 - 99 mg/dL 99  20 units Insulin @9am     Home DM Meds: Lantus 70 units q HS                             U500 20 units tid with meals (just increased yesterday per pt report)   Current Orders: Lantus 75 units QHS                            Novolog 0-24 units TID 2 hours post-meals                            Regular U500 Insulin 20 units TID with meals    MD- Note fasting CBG better this AM.  Not quite under the 90 mg/dl goal but improved from yesterday.  Could increase the Lantus slightly to 77 units QHS for tonight.  Would like to see pt's 2-hour post-prandial breakfast and lunch values today before making further recs on the U500 meal time insulin  2 hour PP CBG after Breakfast was 157--2 hour PP CBG after Lunch not recorded yet--should have been taken 2 hours after the start of the meal  Based on the above info recommend:  1. Increase Lantus slightly to 77 units QHs  2. Increase the meal time U500 Insulin to 22 units TID with meals    --Will follow patient during hospitalization--  Budd Palmer RN, MSN, CDE Diabetes Coordinator Inpatient Glycemic  Control Team Team Pager: [phone] (8a-5p)

## 2020-05-18 NOTE — Discharge Instructions (Signed)
Gestational Diabetes Mellitus, Diagnosis Gestational diabetes (gestational diabetes mellitus) is a short-term (temporary) form of diabetes that can happen during pregnancy. It goes away after you give birth. It may be caused by one or both of these problems:  Your pancreas does not make enough of a hormone called insulin.  Your body does not respond in a normal way to insulin that it makes. Insulin lets sugars (glucose) go into cells in the body. This gives you energy. If you have diabetes, sugars cannot get into cells. This causes high blood sugar (hyperglycemia). If you get gestational diabetes, you are:  More likely to get it if you get pregnant again.  More likely to develop type 2 diabetes in the future. If gestational diabetes is treated, it may not hurt you or your baby. Your doctor will set treatment goals for you. In general, you should have these blood sugar levels:  After not eating for a long time (fasting): 95 mg/dL (5.3 mmol/L).  After meals (postprandial): ? One hour after a meal: at or below 140 mg/dL (7.8 mmol/L). ? Two hours after a meal: at or below 120 mg/dL (6.7 mmol/L).  A1c (hemoglobin A1c) level: 6-6.5%. Follow these instructions at home: Questions to ask your doctor   You may want to ask these questions: ? Do I need to meet with a diabetes educator? ? What equipment will I need to care for myself at home? ? What medicines do I need? When should I take them? ? How often do I need to check my blood sugar? ? What number can I call if I have questions? ? When is my next doctor's visit? General instructions  Take over-the-counter and prescription medicines only as told by your doctor.  Stay at a healthy weight during pregnancy.  Keep all follow-up visits as told by your doctor. This is important. Contact a doctor if:  Your blood sugar is at or above 240 mg/dL (62.9 mmol/L).  Your blood sugar is at or above 200 mg/dL (52.8 mmol/L) and you have ketones in  your pee (urine).  You have been sick or have had a fever for 2 days or more and you are not getting better.  You have any of these problems for more than 6 hours: ? You cannot eat or drink. ? You feel sick to your stomach (nauseous). ? You throw up (vomit). ? You have watery poop (diarrhea). Get help right away if:  Your blood sugar is lower than 54 mg/dL (3 mmol/L).  You get confused.  You have trouble: ? Thinking clearly. ? Breathing.  Your baby moves less than normal.  You have any of these: ? Moderate or large ketone levels in your pee. ? Blood coming from your vagina. ? Unusual fluid coming from your vagina. ? Early contractions. These may feel like tightness in your belly. Summary  Gestational diabetes is a short-term form of diabetes. It can happen while you are pregnant. It goes away after you give birth.  If gestational diabetes is treated, it may not hurt you or your baby. Your doctor will set treatment goals for you.  Keep all follow-up visits as told by your doctor. This is important. This information is not intended to replace advice given to you by your health care provider. Make sure you discuss any questions you have with your health care provider. Document Revised: 06/30/2017 Document Reviewed: 06/27/2015 Elsevier Patient Education  2020 Elsevier Inc. Diabetes Mellitus and Nutrition, Adult When you have diabetes (diabetes mellitus),  it is very important to have healthy eating habits because your blood sugar (glucose) levels are greatly affected by what you eat and drink. Eating healthy foods in the appropriate amounts, at about the same times every day, can help you:  Control your blood glucose.  Lower your risk of heart disease.  Improve your blood pressure.  Reach or maintain a healthy weight. Every person with diabetes is different, and each person has different needs for a meal plan. Your health care provider may recommend that you work with a diet  and nutrition specialist (dietitian) to make a meal plan that is best for you. Your meal plan may vary depending on factors such as:  The calories you need.  The medicines you take.  Your weight.  Your blood glucose, blood pressure, and cholesterol levels.  Your activity level.  Other health conditions you have, such as heart or kidney disease. How do carbohydrates affect me? Carbohydrates, also called carbs, affect your blood glucose level more than any other type of food. Eating carbs naturally raises the amount of glucose in your blood. Carb counting is a method for keeping track of how many carbs you eat. Counting carbs is important to keep your blood glucose at a healthy level, especially if you use insulin or take certain oral diabetes medicines. It is important to know how many carbs you can safely have in each meal. This is different for every person. Your dietitian can help you calculate how many carbs you should have at each meal and for each snack. Foods that contain carbs include:  Bread, cereal, rice, pasta, and crackers.  Potatoes and corn.  Peas, beans, and lentils.  Milk and yogurt.  Fruit and juice.  Desserts, such as cakes, cookies, ice cream, and candy. How does alcohol affect me? Alcohol can cause a sudden decrease in blood glucose (hypoglycemia), especially if you use insulin or take certain oral diabetes medicines. Hypoglycemia can be a life-threatening condition. Symptoms of hypoglycemia (sleepiness, dizziness, and confusion) are similar to symptoms of having too much alcohol. If your health care provider says that alcohol is safe for you, follow these guidelines:  Limit alcohol intake to no more than 1 drink per day for nonpregnant women and 2 drinks per day for men. One drink equals 12 oz of beer, 5 oz of wine, or 1 oz of hard liquor.  Do not drink on an empty stomach.  Keep yourself hydrated with water, diet soda, or unsweetened iced tea.  Keep in  mind that regular soda, juice, and other mixers may contain a lot of sugar and must be counted as carbs. What are tips for following this plan?  Reading food labels  Start by checking the serving size on the "Nutrition Facts" label of packaged foods and drinks. The amount of calories, carbs, fats, and other nutrients listed on the label is based on one serving of the item. Many items contain more than one serving per package.  Check the total grams (g) of carbs in one serving. You can calculate the number of servings of carbs in one serving by dividing the total carbs by 15. For example, if a food has 30 g of total carbs, it would be equal to 2 servings of carbs.  Check the number of grams (g) of saturated and trans fats in one serving. Choose foods that have low or no amount of these fats.  Check the number of milligrams (mg) of salt (sodium) in one serving. Most people   should limit total sodium intake to less than 2,300 mg per day.  Always check the nutrition information of foods labeled as "low-fat" or "nonfat". These foods may be higher in added sugar or refined carbs and should be avoided.  Talk to your dietitian to identify your daily goals for nutrients listed on the label. Shopping  Avoid buying canned, premade, or processed foods. These foods tend to be high in fat, sodium, and added sugar.  Shop around the outside edge of the grocery store. This includes fresh fruits and vegetables, bulk grains, fresh meats, and fresh dairy. Cooking  Use low-heat cooking methods, such as baking, instead of high-heat cooking methods like deep frying.  Cook using healthy oils, such as olive, canola, or sunflower oil.  Avoid cooking with butter, cream, or high-fat meats. Meal planning  Eat meals and snacks regularly, preferably at the same times every day. Avoid going long periods of time without eating.  Eat foods high in fiber, such as fresh fruits, vegetables, beans, and whole grains. Talk  to your dietitian about how many servings of carbs you can eat at each meal.  Eat 4-6 ounces (oz) of lean protein each day, such as lean meat, chicken, fish, eggs, or tofu. One oz of lean protein is equal to: ? 1 oz of meat, chicken, or fish. ? 1 egg. ?  cup of tofu.  Eat some foods each day that contain healthy fats, such as avocado, nuts, seeds, and fish. Lifestyle  Check your blood glucose regularly.  Exercise regularly as told by your health care provider. This may include: ? 150 minutes of moderate-intensity or vigorous-intensity exercise each week. This could be brisk walking, biking, or water aerobics. ? Stretching and doing strength exercises, such as yoga or weightlifting, at least 2 times a week.  Take medicines as told by your health care provider.  Do not use any products that contain nicotine or tobacco, such as cigarettes and e-cigarettes. If you need help quitting, ask your health care provider.  Work with a Veterinary surgeon or diabetes educator to identify strategies to manage stress and any emotional and social challenges. Questions to ask a health care provider  Do I need to meet with a diabetes educator?  Do I need to meet with a dietitian?  What number can I call if I have questions?  When are the best times to check my blood glucose? Where to find more information:  American Diabetes Association: diabetes.org  Academy of Nutrition and Dietetics: www.eatright.[employer] of Diabetes and Digestive and Kidney Diseases (NIH): CarFlippers.tn Summary  A healthy meal plan will help you control your blood glucose and maintain a healthy lifestyle.  Working with a diet and nutrition specialist (dietitian) can help you make a meal plan that is best for you.  Keep in mind that carbohydrates (carbs) and alcohol have immediate effects on your blood glucose levels. It is important to count carbs and to use alcohol carefully. This information is not intended  to replace advice given to you by your health care provider. Make sure you discuss any questions you have with your health care provider. Document Revised: 05/06/2017 Document Reviewed: 06/28/2016 Elsevier Patient Education  2020 [employer].

## 2020-05-18 NOTE — Progress Notes (Signed)
Dc instructions given to patient. No needs or concerns expressed. No iv present. Dulera medication provided to pt.

## 2020-05-18 NOTE — Progress Notes (Signed)
Pt dc home with husband at side. Walked pt down to [employer].

## 2020-05-19 NOTE — Telephone Encounter (Signed)
Patient d/c home 05/18/20.

## 2020-05-21 DIAGNOSIS — J301 Allergic rhinitis due to pollen: Secondary | ICD-10-CM | POA: Diagnosis not present

## 2020-05-23 ENCOUNTER — Other Ambulatory Visit: Payer: Self-pay

## 2020-05-23 ENCOUNTER — Ambulatory Visit (INDEPENDENT_AMBULATORY_CARE_PROVIDER_SITE_OTHER): Payer: Medicaid Other | Admitting: Obstetrics and Gynecology

## 2020-05-23 VITALS — BP 120/76 | Wt 368.0 lb

## 2020-05-23 DIAGNOSIS — Z3A31 31 weeks gestation of pregnancy: Secondary | ICD-10-CM

## 2020-05-23 DIAGNOSIS — R102 Pelvic and perineal pain: Secondary | ICD-10-CM | POA: Diagnosis not present

## 2020-05-23 DIAGNOSIS — O0990 Supervision of high risk pregnancy, unspecified, unspecified trimester: Secondary | ICD-10-CM

## 2020-05-23 DIAGNOSIS — O24119 Pre-existing diabetes mellitus, type 2, in pregnancy, unspecified trimester: Secondary | ICD-10-CM

## 2020-05-23 LAB — POCT URINALYSIS DIPSTICK OB

## 2020-05-23 NOTE — Progress Notes (Signed)
Routine Prenatal Care Visit  Subjective  Yvonne Rios is a 29 y.o. [phone] at [redacted]w[redacted]d being seen today for ongoing prenatal care.  She is currently monitored for the following issues for this high-risk pregnancy and has Anxiety; Asthma; Depression; Environmental allergies; Functional constipation; GERD (gastroesophageal reflux disease); Insomnia; Left-sided low back pain with sciatica; Migraine without status migrainosus, not intractable; Mild intermittent asthma without complication; Morbid obesity due to excess calories (HCC); Myopia, left eye; Pre-existing type 2 diabetes mellitus during pregnancy, antepartum; Maternal obesity, antepartum; Supervision of high risk pregnancy, antepartum; Asthma during pregnancy; Chromosome abnormality; Attention deficit disorder (ADD) in adult; Uncontrolled type 2 diabetes mellitus with hyperglycemia (HCC); and Pre-existing type 2 diabetes mellitus with hyperglycemia during pregnancy in third trimester [facility]) on their problem list.  ----------------------------------------------------------------------------------- Patient reports pelvic pressure.  Has been doing better as far as sugar are concerned.  Postprandial lunch today 208 though reports this to be a one hour postprandial after eating [employer] for lunch.   Contractions: Not present. Vag. Bleeding: None.  Movement: Present. Denies leaking of fluid.  ----------------------------------------------------------------------------------- The following portions of the patient's history were reviewed and updated as appropriate: allergies, current medications, past family history, past medical history, past social history, past surgical history and problem list. Problem list updated.   Objective  Blood pressure 120/76, weight (!) 368 lb (166.9 kg), last menstrual period 10/18/2019. Pregravid weight 350 lb (158.8 kg) Total Weight Gain 18 lb (8.165 kg) Urinalysis:      Fetal Status: Fetal Heart Rate (bpm): 140    Movement: Present     General:  Alert, oriented and cooperative. Patient is in no acute distress.  Skin: Skin is warm and dry. No rash noted.   Cardiovascular: Normal heart rate noted  Respiratory: Normal respiratory effort, no problems with respiration noted  Abdomen: Soft, gravid, appropriate for gestational age. Pain/Pressure: Absent     Pelvic:  Cervical exam performed        Extremities: Normal range of motion.     ental Status: Normal mood and affect. Normal behavior. Normal judgment and thought content.     Assessment   29 y.o. [id number] at [redacted]w[redacted]d by  07/22/2020, Alternate EDD Entry presenting for routine prenatal visit  Plan   FOURTH Problems (from 12/28/19 to present)    Problem Noted Resolved   Supervision of high risk pregnancy, antepartum 12/31/2019 by Vena Austria, MD No   Overview Addendum 01/25/2020 12:53 PM by Vena Austria, MD    [facility]inic [facility] Prenatal Labs  Dating LMP = 6week [place] Blood type: O/Positive/-- (07/23 1457)   Genetic Screen NIPS: normal XY Fragile X neg, CF neg, SMA neg Antibody:Negative (07/23 1457)  Anatomic [place]  Rubella: 16.40 (07/23 1457) Varicella: Immune  GTT N/A DMII, baseline HgbA1C 9.4 RPR: Non Reactive (07/23 1457)   Rhogam N/A HBsAg: Negative (07/23 1457)   TDaP vaccine                       Flu Shot: HIV: Non Reactive (07/23 1457)   Baby Food                                GBS:   Contraception  Pap: 12/28/2019 NILM  CBB     CS/VBAC N/A   Support Person Husband [name]           Previous Version   Asthma during pregnancy 12/31/2019 by Vena Austria,  MD No   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 by Vena Austria, MD No   Overview Addendum 12/31/2019 10:53 PM by Vena Austria, MD    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 A2/B GDM)  For A2/B GDM or higher classes of DM [X]  Diabetes Education and Testing Supplies [X]  Nutrition Counsult [ ]  Fetal ECHO after 22-24 weeks  [ ]  Eye exam for retina evaluation  [ ]  Baseline EKG [ ]  fetal growth every 4 weeks starting at 28 weeks [ ]  Twice weekly NST starting at [redacted] weeks gestation [ ]  Delivery planning contingent on fetal growth, AFI, glycemic control, and other co-morbidities but at least by 39 weeks  Baseline and surveillance labs (pulled in from [facility], refresh links as needed)  Lab Results  Component Value Date   CREATININE 0.53 (L) 12/28/2019   AST 14 12/28/2019   ALT 14 12/28/2019   TSH 1.390 09/20/2016   Lab Results  Component Value Date   HGBA1C 9.4 (H) 12/28/2019   HGBA1C 6.6 (H) 09/20/2016    Antenatal Testing Class of DM U/S NST/AFI DELIVERY  Diabetes   A1 - good control - O24.410    A2 - good control - O24.419      A2  - poor control or poor compliance - O24.419, E11.65   (Macrosomia or polyhydramnios) **E11.65 is extra code for poor control**    A2/B - O24.919  and B-C O24.319  Poor control B-C or D-R-F-T - O24.319  or  Type I DM - O24.019  20-38  20-38  20-24-28-32-36   20-24-28-32-35-38//fetal echo  20-24-27-30-33-36-38//fetal echo  40  32//2 x wk  32//2 x wk   32//2 x wk  28//BPP wkly then 32//2 x wk  40  39  PRN   39  PRN          Maternal obesity, antepartum 12/28/2019 by 12/30/2019, MD No   First trimester bleeding 12/31/2019 by 07-07-1983, MD 03/26/2020 by 03-08-1971, MD       Gestational age appropriate obstetric precautions including but not limited to vaginal bleeding, contractions, leaking of fluid and fetal movement were reviewed in detail with the patient.    - BG significantly improved since recent hospitalization for BG stabalization - start APT twice  weekly at 32 weeks - growth scan next visit  Return in about 1 week (around 05/30/2020) for ROB, Growth scan, NST.  05-28-1983, MD, 09-04-2004 OB/GYN, [facility] 05/23/2020, 3:37 PM

## 2020-05-23 NOTE — Progress Notes (Signed)
ROB - having lots of pain and pressure. RM 4

## 2020-05-24 LAB — PROTEIN / CREATININE RATIO, URINE
Creatinine, Urine: 81.5 mg/dL
Protein, Ur: 15.7 mg/dL
Protein/Creat Ratio: 193 mg/g creat (ref 0–200)

## 2020-05-25 LAB — URINE CULTURE

## 2020-05-26 ENCOUNTER — Encounter: Payer: Medicaid Other | Admitting: Obstetrics and Gynecology

## 2020-05-29 ENCOUNTER — Ambulatory Visit (INDEPENDENT_AMBULATORY_CARE_PROVIDER_SITE_OTHER): Payer: Medicaid Other | Admitting: Obstetrics and Gynecology

## 2020-05-29 ENCOUNTER — Ambulatory Visit (INDEPENDENT_AMBULATORY_CARE_PROVIDER_SITE_OTHER): Payer: Medicaid Other

## 2020-05-29 ENCOUNTER — Other Ambulatory Visit: Payer: Self-pay

## 2020-05-29 DIAGNOSIS — O0990 Supervision of high risk pregnancy, unspecified, unspecified trimester: Secondary | ICD-10-CM

## 2020-05-29 DIAGNOSIS — J45909 Unspecified asthma, uncomplicated: Secondary | ICD-10-CM

## 2020-05-29 DIAGNOSIS — O24119 Pre-existing diabetes mellitus, type 2, in pregnancy, unspecified trimester: Secondary | ICD-10-CM

## 2020-05-29 DIAGNOSIS — O99519 Diseases of the respiratory system complicating pregnancy, unspecified trimester: Secondary | ICD-10-CM

## 2020-05-29 DIAGNOSIS — O99210 Obesity complicating pregnancy, unspecified trimester: Secondary | ICD-10-CM

## 2020-05-29 LAB — POCT URINALYSIS DIPSTICK OB: POC,PROTEIN,UA: NEGATIVE

## 2020-05-29 NOTE — Progress Notes (Signed)
ROB - cramping in pelvic area. Procedure RM

## 2020-05-29 NOTE — Progress Notes (Signed)
Routine Prenatal Care Visit  Subjective  Yvonne Rios is a 29 y.o. G4P1021 at [redacted]w[redacted]d being seen today for ongoing prenatal care.  She is currently monitored for the following issues for this high-risk pregnancy and has Anxiety; Asthma; Depression; Environmental allergies; Functional constipation; GERD (gastroesophageal reflux disease); Insomnia; Left-sided low back pain with sciatica; Migraine without status migrainosus, not intractable; Mild intermittent asthma without complication; Morbid obesity due to excess calories (HCC); Myopia, left eye; Pre-existing type 2 diabetes mellitus during pregnancy, antepartum; Maternal obesity, antepartum; Supervision of high risk pregnancy, antepartum; Asthma during pregnancy; Chromosome abnormality; Attention deficit disorder (ADD) in adult; Uncontrolled type 2 diabetes mellitus with hyperglycemia (HCC); and Pre-existing type 2 diabetes mellitus with hyperglycemia during pregnancy in third trimester [facility]) on their problem list.  ----------------------------------------------------------------------------------- Patient reports no complaints.   Contractions: Irregular. Vag. Bleeding: None.  Movement: Present. Denies leaking of fluid.  ----------------------------------------------------------------------------------- The following portions of the patient's history were reviewed and updated as appropriate: allergies, current medications, past family history, past medical history, past social history, past surgical history and problem list. Problem list updated.   Objective  Blood pressure 110/72, weight (!) 361 lb (163.7 kg), last menstrual period 10/18/2019. Pregravid weight 350 lb (158.8 kg) Total Weight Gain 11 lb (4.99 kg) Urinalysis:      Fetal Status: Fetal Heart Rate (bpm): 135   Movement: Present  Presentation: Vertex  General:  Alert, oriented and cooperative. Patient is in no acute distress.  Skin: Skin is warm and dry. No rash noted.    Cardiovascular: Normal heart rate noted  Respiratory: Normal respiratory effort, no problems with respiration noted  Abdomen: Soft, gravid, appropriate for gestational age. Pain/Pressure: Absent     Pelvic:  Cervical exam deferred        Extremities: Normal range of motion.     ental Status: Normal mood and affect. Normal behavior. Normal judgment and thought content.   Baseline: 135 Variability: moderate Accelerations: present Decelerations: absent Tocometry: N/A The patient was monitored for 30 minutes, fetal heart rate tracing was deemed reactive, category I tracing,  CPT 59025   US OB Follow Up  Result Date: 05/29/2020 Patient Name: Yvonne Rios DOB: 08-08-1990 MRN: [id number] ULTRASOUND REPORT Location: [facility] Date of Service: 05/29/2020 Indications:growth/afi Findings: Mason Jim intrauterine pregnancy is visualized with FHR at 135 BPM. Biometrics give an (U/S) Gestational age of [redacted]w[redacted]d and an (U/S) EDD of 07/10/2020; this correlates with the clinically established Estimated Date of Delivery: 07/22/20. Fetal presentation is Cephalic. Placenta: posterior. Grade: 2 AFI: 8.2 cm Growth percentile is 70.8%.  AC percentile is 94.8%. EFW: 2310 g  ( 5 lb 1 oz ) Impression: 1. [redacted]w[redacted]d Viable Singleton Intrauterine pregnancy previously established criteria. 2. Growth is 70.8 %ile.  AFI is 8.2 cm. Recommendations: 1.Clinical correlation with the patient's History and Physical Exam. Deanna Artis, RT There is a singleton gestation with normal amniotic fluid volume. The fetal biometry correlates with established dating, there is AC acceleration present.  The visualized fetal anatomy appears within normal limits within the resolution of ultrasound as described above.  It must be noted that a normal ultrasound is unable to rule out fetal aneuploidy.  Vena Austria, MD, [facility], [facility] 05/29/2020, 11:00 AM     Assessment   29 y.o. T0G2694 at [redacted]w[redacted]d  by  07/22/2020, Alternate EDD Entry presenting for routine prenatal visit  Plan   FOURTH Problems (from 12/28/19 to present)    Problem Noted Resolved  Supervision of high risk pregnancy, antepartum 12/31/2019 by Vena Austria, MD No   Overview Addendum 01/25/2020 12:53 PM by Vena Austria, MD    Clinic [facility] Prenatal Labs  Dating LMP = 6week [place] Blood type: O/Positive/-- (07/23 1457)   Genetic Screen NIPS: normal XY Fragile X neg, CF neg, SMA neg Antibody:Negative (07/23 1457)  Anatomic [place]  Rubella: 16.40 (07/23 1457) Varicella: Immune  GTT N/A DMII, baseline HgbA1C 9.4 RPR: Non Reactive (07/23 1457)   Rhogam N/A HBsAg: Negative (07/23 1457)   TDaP vaccine                       Flu Shot: HIV: Non Reactive (07/23 1457)   Baby Food                                GBS:   Contraception  Pap: 12/28/2019 NILM  CBB     CS/VBAC N/A   Support Person Husband [name]           Previous Version   Asthma during pregnancy 12/31/2019 by Vena Austria, MD No   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 by Vena Austria, MD No   Overview Addendum 12/31/2019 10:53 PM by Vena Austria, MD    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 A2/B GDM)  For A2/B GDM or higher classes of DM [X]  Diabetes Education and Testing Supplies [X]  Nutrition Counsult [ ]  Fetal ECHO after 22-24 weeks  [ ]  Eye exam for retina evaluation  [ ]  Baseline EKG [ ]  fetal growth every 4 weeks starting at 28 weeks [ ]  Twice weekly NST starting at [redacted] weeks gestation [ ]  Delivery planning contingent on fetal growth, AFI, glycemic control, and other co-morbidities but at least by 39 weeks  Baseline and  surveillance labs (pulled in from [facility], refresh links as needed)  Lab Results  Component Value Date   CREATININE 0.53 (L) 12/28/2019   AST 14 12/28/2019   ALT 14 12/28/2019   TSH 1.390 09/20/2016   Lab Results  Component Value Date   HGBA1C 9.4 (H) 12/28/2019   HGBA1C 6.6 (H) 09/20/2016    Antenatal Testing Class of DM U/S NST/AFI DELIVERY  Diabetes   A1 - good control - O24.410    A2 - good control - O24.419      A2  - poor control or poor compliance - O24.419, E11.65   (Macrosomia or polyhydramnios) **E11.65 is extra code for poor control**    A2/B - O24.919  and B-C O24.319  Poor control B-C or D-R-F-T - O24.319  or  Type I DM - O24.019  20-38  20-38  20-24-28-32-36   20-24-28-32-35-38//fetal echo  20-24-27-30-33-36-38//fetal echo  40  32//2 x wk  32//2 x wk   32//2 x wk  28//BPP wkly then 32//2 x wk  40  39  PRN   39  PRN          Maternal obesity, antepartum 12/28/2019 by 09/22/2016, MD No   First trimester bleeding 12/31/2019 by 09/22/2016, MD 03/26/2020 by [birth year]-1971, MD  Gestational age appropriate obstetric precautions including but not limited to vaginal bleeding, contractions, leaking of fluid and fetal movement were reviewed in detail with the patient.    1) DMII - continue Lantus 75U qHS and 25U AC TID, BG still intermittently 130-140 postprandial per patient reports - growth scan appropriate with some AC acceleration, normal AFI - still anticipate delivery at 37 weeks most likely - Twice weekly APT  Return in about 4 days (around 06/02/2020) for ROB/NST 12/27, ROB/NST/AFI12/30.  Vena Austria, MD, [facility], [facility] 05/29/2020, 12:13 PM

## 2020-06-02 ENCOUNTER — Encounter: Payer: Self-pay | Admitting: Obstetrics and Gynecology

## 2020-06-02 ENCOUNTER — Ambulatory Visit (INDEPENDENT_AMBULATORY_CARE_PROVIDER_SITE_OTHER): Payer: Medicaid Other | Admitting: Obstetrics and Gynecology

## 2020-06-02 ENCOUNTER — Other Ambulatory Visit: Payer: Self-pay

## 2020-06-02 VITALS — BP 132/84 | Wt 363.0 lb

## 2020-06-02 DIAGNOSIS — O0993 Supervision of high risk pregnancy, unspecified, third trimester: Secondary | ICD-10-CM

## 2020-06-02 DIAGNOSIS — Z3A32 32 weeks gestation of pregnancy: Secondary | ICD-10-CM

## 2020-06-02 DIAGNOSIS — O24119 Pre-existing diabetes mellitus, type 2, in pregnancy, unspecified trimester: Secondary | ICD-10-CM | POA: Diagnosis not present

## 2020-06-02 DIAGNOSIS — O99519 Diseases of the respiratory system complicating pregnancy, unspecified trimester: Secondary | ICD-10-CM

## 2020-06-02 DIAGNOSIS — O99210 Obesity complicating pregnancy, unspecified trimester: Secondary | ICD-10-CM

## 2020-06-02 DIAGNOSIS — J45909 Unspecified asthma, uncomplicated: Secondary | ICD-10-CM

## 2020-06-02 MED ORDER — INSULIN GLARGINE 100 UNIT/ML SOLOSTAR PEN: 85.0000 [IU] | PEN_INJECTOR | Freq: Every day | SUBCUTANEOUS | 11 refills | Status: DC

## 2020-06-02 MED ORDER — HUMULIN R U-500 KWIKPEN 500 UNIT/ML ~~LOC~~ SOPN: 30.0000 [IU] | PEN_INJECTOR | Freq: Three times a day (TID) | SUBCUTANEOUS | 6 refills | Status: DC

## 2020-06-02 NOTE — Progress Notes (Signed)
Routine Prenatal Care Visit  Subjective  Yvonne Rios is a 29 y.o. [phone] at [redacted]w[redacted]d being seen today for ongoing prenatal care.  She is currently monitored for the following issues for this high-risk pregnancy and has Anxiety; Asthma; Depression; Environmental allergies; Functional constipation; GERD (gastroesophageal reflux disease); Insomnia; Left-sided low back pain with sciatica; Migraine without status migrainosus, not intractable; Mild intermittent asthma without complication; Morbid obesity due to excess calories (HCC); Myopia, left eye; Pre-existing type 2 diabetes mellitus during pregnancy, antepartum; Maternal obesity, antepartum; Supervision of high risk pregnancy, antepartum; Asthma during pregnancy; Chromosome abnormality; Attention deficit disorder (ADD) in adult; Uncontrolled type 2 diabetes mellitus with hyperglycemia (HCC); and Pre-existing type 2 diabetes mellitus with hyperglycemia during pregnancy in third trimester [facility]) on their problem list.  ----------------------------------------------------------------------------------- Patient reports no complaints.   Contractions: Irregular. Vag. Bleeding: None.  Movement: Present. Leaking Fluid denies.  T2DM: Did not bring BG log. States she is taking insulin without any lows. States her fasting values are around 120, 2h pp values around 170.  ----------------------------------------------------------------------------------- The following portions of the patient's history were reviewed and updated as appropriate: allergies, current medications, past family history, past medical history, past social history, past surgical history and problem list. Problem list updated.  Objective  Blood pressure 132/84, weight (!) 363 lb (164.7 kg), last menstrual period 10/18/2019. Pregravid weight 350 lb (158.8 kg) Total Weight Gain 13 lb (5.897 kg) Urinalysis: Urine Protein    Urine Glucose    Fetal Status: Fetal Heart Rate (bpm): 125    Movement: Present     General:  Alert, oriented and cooperative. Patient is in no acute distress.  Skin: Skin is warm and dry. No rash noted.   Cardiovascular: Normal heart rate noted  Respiratory: Normal respiratory effort, no problems with respiration noted  Abdomen: Soft, gravid, appropriate for gestational age. Pain/Pressure: Absent     Pelvic:  Cervical exam deferred        Extremities: Normal range of motion.     Mental Status: Normal mood and affect. Normal behavior. Normal judgment and thought content.   NST: Baseline FHR: 125 beats/min Variability: moderate Accelerations: present Decelerations: absent Tocometry: not done  Interpretation:  INDICATIONS: diabetes mellitus RESULTS:  A NST procedure was performed with FHR monitoring and a normal baseline established, appropriate time of 20-40 minutes of evaluation, and accels >2 seen w 15x15 characteristics.  Results show a REACTIVE NST.    Assessment   29 y.o. X3G1829 at [redacted]w[redacted]d by  07/22/2020, Alternate EDD Entry presenting for routine prenatal visit  Plan   FOURTH Problems (from 12/28/19 to present)    Problem Noted Resolved   Supervision of high risk pregnancy, antepartum 12/31/2019 by Vena Austria, MD No   Overview Addendum 01/25/2020 12:53 PM by Vena Austria, MD    Clinic [facility] Prenatal Labs  Dating LMP = 6week [place] Blood type: O/Positive/-- (07/23 1457)   Genetic Screen NIPS: normal XY Fragile X neg, CF neg, SMA neg Antibody:Negative (07/23 1457)  Anatomic [place]  Rubella: 16.40 (07/23 1457) Varicella: Immune  GTT N/A DMII, baseline HgbA1C 9.4 RPR: Non Reactive (07/23 1457)   Rhogam N/A HBsAg: Negative (07/23 1457)   TDaP vaccine                       Flu Shot: HIV: Non Reactive (07/23 1457)   Baby Food  GBS:   Contraception  Pap: 12/28/2019 NILM  CBB     CS/VBAC N/A   Support Person Husband [name]           Previous Version   Asthma during pregnancy 12/31/2019 by Vena Austria, MD No   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 by Vena Austria, MD No   Overview Addendum 12/31/2019 10:53 PM by Vena Austria, MD    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 A2/B GDM)  For A2/B GDM or higher classes of DM [X]  Diabetes Education and Testing Supplies [X]  Nutrition Counsult [ ]  Fetal ECHO after 22-24 weeks  [ ]  Eye exam for retina evaluation  [ ]  Baseline EKG [ ]  fetal growth every 4 weeks starting at 28 weeks [ ]  Twice weekly NST starting at [redacted] weeks gestation [ ]  Delivery planning contingent on fetal growth, AFI, glycemic control, and other co-morbidities but at least by 39 weeks  Baseline and surveillance labs (pulled in from [facility], refresh links as needed)  Lab Results  Component Value Date   CREATININE 0.53 (L) 12/28/2019   AST 14 12/28/2019   ALT 14 12/28/2019   TSH 1.390 09/20/2016   Lab Results  Component Value Date   HGBA1C 9.4 (H) 12/28/2019   HGBA1C 6.6 (H) 09/20/2016    Antenatal Testing Class of DM U/S NST/AFI DELIVERY  Diabetes   A1 - good control - O24.410    A2 - good control - O24.419      A2  - poor control or poor compliance - O24.419, E11.65   (Macrosomia or polyhydramnios) **E11.65 is extra code for poor control**    A2/B - O24.919  and B-C O24.319  Poor control B-C or D-R-F-T - O24.319  or  Type I DM - O24.019  20-38  20-38  20-24-28-32-36   20-24-28-32-35-38//fetal echo  20-24-27-30-33-36-38//fetal echo  40  32//2 x wk  32//2 x wk   32//2 x wk  28//BPP wkly then 32//2 x wk  40  39  PRN   39  PRN          Maternal obesity, antepartum 12/28/2019 by 09/22/2016, MD No   First  trimester bleeding 12/31/2019 by 09/22/2016, MD 03/26/2020 by [birth year]-08-1971, MD       Preterm labor symptoms and general obstetric precautions including but not limited to vaginal bleeding, contractions, leaking of fluid and fetal movement were reviewed in detail with the patient. Please refer to After Visit Summary for other counseling recommendations.   Change insulin to Lantus 85 units HS, short acting 30 units AC meals TID.  Continue with current management plan of care  Return in about 1 week (around 06/09/2020) for ROB with NST twice next week (Monday/Thursday).   [birth year]1983, MD, 09-04-2004 OB/[facility]N, [facility] 06/02/2020 10:12 AM

## 2020-06-04 ENCOUNTER — Ambulatory Visit (INDEPENDENT_AMBULATORY_CARE_PROVIDER_SITE_OTHER): Payer: Medicaid Other

## 2020-06-04 ENCOUNTER — Ambulatory Visit (INDEPENDENT_AMBULATORY_CARE_PROVIDER_SITE_OTHER): Payer: Medicaid Other | Admitting: Obstetrics and Gynecology

## 2020-06-04 ENCOUNTER — Other Ambulatory Visit: Payer: Self-pay | Admitting: Obstetrics and Gynecology

## 2020-06-04 ENCOUNTER — Other Ambulatory Visit: Payer: Self-pay

## 2020-06-04 VITALS — BP 126/82 | Wt 360.0 lb

## 2020-06-04 DIAGNOSIS — O24119 Pre-existing diabetes mellitus, type 2, in pregnancy, unspecified trimester: Secondary | ICD-10-CM

## 2020-06-04 DIAGNOSIS — O99210 Obesity complicating pregnancy, unspecified trimester: Secondary | ICD-10-CM

## 2020-06-04 DIAGNOSIS — Q999 Chromosomal abnormality, unspecified: Secondary | ICD-10-CM

## 2020-06-04 DIAGNOSIS — O99519 Diseases of the respiratory system complicating pregnancy, unspecified trimester: Secondary | ICD-10-CM | POA: Diagnosis not present

## 2020-06-04 DIAGNOSIS — J45909 Unspecified asthma, uncomplicated: Secondary | ICD-10-CM | POA: Diagnosis not present

## 2020-06-04 DIAGNOSIS — O0990 Supervision of high risk pregnancy, unspecified, unspecified trimester: Secondary | ICD-10-CM

## 2020-06-04 DIAGNOSIS — E1165 Type 2 diabetes mellitus with hyperglycemia: Secondary | ICD-10-CM

## 2020-06-04 DIAGNOSIS — Z3A33 33 weeks gestation of pregnancy: Secondary | ICD-10-CM

## 2020-06-04 LAB — POCT URINALYSIS DIPSTICK OB

## 2020-06-04 LAB — FETAL NONSTRESS TEST

## 2020-06-04 NOTE — Progress Notes (Signed)
Routine Prenatal Care Visit  Subjective  Yvonne Rios is a 29 y.o. [phone] at [redacted]w[redacted]d being seen today for ongoing prenatal care.  She is currently monitored for the following issues for this high-risk pregnancy and has Anxiety; Asthma; Depression; Environmental allergies; Functional constipation; GERD (gastroesophageal reflux disease); Insomnia; Left-sided low back pain with sciatica; Migraine without status migrainosus, not intractable; Mild intermittent asthma without complication; Morbid obesity due to excess calories (HCC); Myopia, left eye; Pre-existing type 2 diabetes mellitus during pregnancy, antepartum; Maternal obesity, antepartum; Supervision of high risk pregnancy, antepartum; Asthma during pregnancy; Chromosome abnormality; Attention deficit disorder (ADD) in adult; Uncontrolled type 2 diabetes mellitus with hyperglycemia (HCC); and Pre-existing type 2 diabetes mellitus with hyperglycemia during pregnancy in third trimester [facility]) on their problem list.  ----------------------------------------------------------------------------------- Patient reports no complaints.   Contractions: Irregular. Vag. Bleeding: None.  Movement: Present. Denies leaking of fluid.  ----------------------------------------------------------------------------------- The following portions of the patient's history were reviewed and updated as appropriate: allergies, current medications, past family history, past medical history, past social history, past surgical history and problem list. Problem list updated.   Objective  Last menstrual period 10/18/2019. Pregravid weight 350 lb (158.8 kg) Total Weight Gain 10 lb (4.536 kg) Urinalysis:      Fetal Status: Fetal Heart Rate (bpm): 130   Movement: Present  Presentation: Transverse  General:  Alert, oriented and cooperative. Patient is in no acute distress.  Skin: Skin is warm and dry. No rash noted.   Cardiovascular: Normal heart rate noted   Respiratory: Normal respiratory effort, no problems with respiration noted  Abdomen: Soft, gravid, appropriate for gestational age. Pain/Pressure: Present     Pelvic:  Cervical exam performed Dilation: Closed Effacement (%): 30 Station: -3  Extremities: Normal range of motion.     ental Status: Normal mood and affect. Normal behavior. Normal judgment and thought content.   Baseline: 130 Variability: moderate Accelerations: present Decelerations: absent Tocometry: N/A The patient was monitored for 30 minutes, fetal heart rate tracing was deemed reactive, category I tracing,  CPT 59025    US OB Limited  Result Date: 06/04/2020 Patient Name: Yvonne Rios DOB: 03-May-1991 MRN: [id number] ULTRASOUND REPORT Location: [facility] Date of Service: 06/04/2020 Indications:AFI Findings: Mason Jim intrauterine pregnancy is visualized with FHR at 127 BPM. Fetal presentation is Oblique Head Up. Placenta: Posterior Fundal. Grade: 2 AFI: 14.2 cm Impression: 1. [redacted]w[redacted]d Viable Singleton Intrauterine pregnancy dated by previously established criteria. 2. AFI is 14.2 cm. Recommendations: 1.Clinical correlation with the patient's History and Physical Exam. Deanna Artis, RT There is a singleton gestation with normal amniotic fluid volume. The visualized fetal anatomy appears within normal limits within the resolution of ultrasound as described above.  It must be noted that a normal ultrasound is unable to rule out fetal aneuploidy.  Vena Austria, MD, [facility], [facility] 06/04/2020, 1:46 PM   US OB Follow Up  Result Date: 05/29/2020 Patient Name: Yvonne Rios DOB: [birth year] MRN: [id number] ULTRASOUND REPORT Location: [facility] Date of Service: 05/29/2020 Indications:growth/afi Findings: Mason Jim intrauterine pregnancy is visualized with FHR at 135 BPM. Biometrics give an (U/S) Gestational age of [redacted]w[redacted]d and an (U/S) EDD of 07/10/2020; this correlates with  the clinically established Estimated Date of Delivery: 07/22/20. Fetal presentation is Cephalic. Placenta: posterior. Grade: 2 AFI: 8.2 cm Growth percentile is 70.8%.  AC percentile is 94.8%. EFW: 2310 g  ( 5 lb 1 oz ) Impression: 1. [redacted]w[redacted]d Viable Singleton Intrauterine pregnancy previously established criteria.  2. Growth is 70.8 %ile.  AFI is 8.2 cm. Recommendations: 1.Clinical correlation with the patient's History and Physical Exam. Deanna Artis, RT There is a singleton gestation with normal amniotic fluid volume. The fetal biometry correlates with established dating, there is AC acceleration present.  The visualized fetal anatomy appears within normal limits within the resolution of ultrasound as described above.  It must be noted that a normal ultrasound is unable to rule out fetal aneuploidy.  Vena Austria, MD, [facility], [facility] 05/29/2020, 11:00 AM   Assessment   29 y.o. L5B2620 at [redacted]w[redacted]d by  07/22/2020, Alternate EDD Entry presenting for routine prenatal visit  Plan   FOURTH Problems (from 12/28/19 to present)    Problem Noted Resolved   Supervision of high risk pregnancy, antepartum 12/31/2019 by Vena Austria, MD No   Overview Addendum 01/25/2020 12:53 PM by Vena Austria, MD    Clinic [facility] Prenatal Labs  Dating LMP = 6week [place] Blood type: O/Positive/-- (07/23 1457)   Genetic Screen NIPS: normal XY Fragile X neg, CF neg, SMA neg Antibody:Negative (07/23 1457)  Anatomic [place]  Rubella: 16.40 (07/23 1457) Varicella: Immune  GTT N/A DMII, baseline HgbA1C 9.4 RPR: Non Reactive (07/23 1457)   Rhogam N/A HBsAg: Negative (07/23 1457)   TDaP vaccine                       Flu Shot: HIV: Non Reactive (07/23 1457)   Baby Food                                GBS:   Contraception  Pap: 12/28/2019 NILM  CBB     CS/VBAC N/A   Support Person Husband [name]           Previous Version   Asthma during pregnancy 12/31/2019 by Vena Austria, MD No    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 by Vena Austria, MD No   Overview Addendum 12/31/2019 10:53 PM by Vena Austria, MD    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 A2/B GDM)  For A2/B GDM or higher classes of DM [X]  Diabetes Education and Testing Supplies [X]  Nutrition Counsult [ ]  Fetal ECHO after 22-24 weeks  [ ]  Eye exam for retina evaluation  [ ]  Baseline EKG [ ]  fetal growth every 4 weeks starting at 28 weeks [ ]  Twice weekly NST starting at [redacted] weeks gestation [ ]  Delivery planning contingent on fetal growth, AFI, glycemic control, and other co-morbidities but at least by 39 weeks  Baseline and surveillance labs (pulled in from [facility], refresh links as needed)  Lab Results  Component Value Date   CREATININE 0.53 (L) 12/28/2019   AST 14 12/28/2019   ALT 14 12/28/2019   TSH 1.390 09/20/2016   Lab Results  Component Value Date   HGBA1C 9.4 (H) 12/28/2019   HGBA1C 6.6 (H) 09/20/2016    Antenatal Testing Class of DM U/S NST/AFI DELIVERY  Diabetes   A1 - good control - O24.410    A2 - good control - O24.419  A2  - poor control or poor compliance - O24.419, E11.65   (Macrosomia or polyhydramnios) **E11.65 is extra code for poor control**    A2/B - O24.919  and B-C O24.319  Poor control B-C or D-R-F-T - O24.319  or  Type I DM - O24.019  20-38  20-38  20-24-28-32-36   20-24-28-32-35-38//fetal echo  20-24-27-30-33-36-38//fetal echo  40  32//2 x wk  32//2 x wk   32//2 x wk  28//BPP wkly then 32//2 x wk  40  39  PRN   39  PRN          Maternal obesity, antepartum 12/28/2019 by Vena Austria, MD No   First trimester bleeding  12/31/2019 by Vena Austria, MD 03/26/2020 by Vena Austria, MD       Gestational age appropriate obstetric precautions including but not limited to vaginal bleeding, contractions, leaking of fluid and fetal movement were reviewed in detail with the patient.    1) DMII with suboptimal control - still spilling glucose on urine dip today - BG today in the 170's - reiterated diet - Monday and Tuesdays values closer to goal 130-140 for postprandials - growth scan 36 weeks, continue twice weekly testing.  Given overall poor control my recommendation remains delivery at 37 weeks  2) Oblique presentation - monitor if remains malpositioned at 36 week growth discussed version.  However if over 4000g we have previously discussed C-section for delivery  Return in about 5 days (around 06/09/2020) for ROB & NST 06/10/2019, and 06/12/2020 ROB, NST, AFI.  Vena Austria, MD, [facility], [facility], [facility] 06/04/2020, 3:06 PM

## 2020-06-07 DIAGNOSIS — Z419 Encounter for procedure for purposes other than remedying health state, unspecified: Secondary | ICD-10-CM | POA: Diagnosis not present

## 2020-06-07 NOTE — L&D Delivery Note (Signed)
Delivery Note At 8:58 AM a viable female was delivered via Vaginal, Spontaneous (Presentation: Right Occiput Anterior).  APGAR: 8, 9; weight pending.   Placenta status: Spontaneous, Intact.  Cord: 3 vessels with the following complications: None.  Cord pH: N/A  Anesthesia: Epidural Episiotomy: None Lacerations:  None Suture Repair: N/A Est. Blood Loss (mL):  Mom to postpartum.  Baby to Couplet care / Skin to Skin.  Vena Austria 07/06/2020, 9:15 AM

## 2020-06-10 ENCOUNTER — Encounter: Payer: Medicaid Other | Admitting: Obstetrics and Gynecology

## 2020-06-11 ENCOUNTER — Ambulatory Visit (INDEPENDENT_AMBULATORY_CARE_PROVIDER_SITE_OTHER): Payer: Medicaid Other | Admitting: Obstetrics and Gynecology

## 2020-06-11 ENCOUNTER — Other Ambulatory Visit: Payer: Self-pay

## 2020-06-11 VITALS — BP 113/71 | Wt 360.1 lb

## 2020-06-11 DIAGNOSIS — Z3A33 33 weeks gestation of pregnancy: Secondary | ICD-10-CM | POA: Diagnosis not present

## 2020-06-11 DIAGNOSIS — O24119 Pre-existing diabetes mellitus, type 2, in pregnancy, unspecified trimester: Secondary | ICD-10-CM

## 2020-06-11 DIAGNOSIS — O99213 Obesity complicating pregnancy, third trimester: Secondary | ICD-10-CM | POA: Diagnosis not present

## 2020-06-11 DIAGNOSIS — O24113 Pre-existing diabetes mellitus, type 2, in pregnancy, third trimester: Secondary | ICD-10-CM

## 2020-06-11 DIAGNOSIS — O99210 Obesity complicating pregnancy, unspecified trimester: Secondary | ICD-10-CM

## 2020-06-11 DIAGNOSIS — O0990 Supervision of high risk pregnancy, unspecified, unspecified trimester: Secondary | ICD-10-CM

## 2020-06-11 LAB — FETAL NONSTRESS TEST

## 2020-06-11 NOTE — Progress Notes (Signed)
Routine Prenatal Care Visit  Subjective  Yvonne Rios is a 30 y.o. [phone] at [redacted]w[redacted]d being seen today for ongoing prenatal care.  She is currently monitored for the following issues for this high-risk pregnancy and has Anxiety; Asthma; Depression; Environmental allergies; Functional constipation; GERD (gastroesophageal reflux disease); Insomnia; Left-sided low back pain with sciatica; Migraine without status migrainosus, not intractable; Mild intermittent asthma without complication; Morbid obesity due to excess calories (HCC); Myopia, left eye; Pre-existing type 2 diabetes mellitus during pregnancy, antepartum; Maternal obesity, antepartum; Supervision of high risk pregnancy, antepartum; Asthma during pregnancy; Chromosome abnormality; Attention deficit disorder (ADD) in adult; Uncontrolled type 2 diabetes mellitus with hyperglycemia (HCC); and Pre-existing type 2 diabetes mellitus with hyperglycemia during pregnancy in third trimester (HCC) on their problem list.  ----------------------------------------------------------------------------------- Patient reports occasional contractions but no patterns and self limited still.   Contractions: Irregular. Vag. Bleeding: None.  Movement: Present. Denies leaking of fluid.  ----------------------------------------------------------------------------------- The following portions of the patient's history were reviewed and updated as appropriate: allergies, current medications, past family history, past medical history, past social history, past surgical history and problem list. Problem list updated.   Objective  Blood pressure 113/71, weight (!) 360 lb 2 oz (163.4 kg), last menstrual period 10/18/2019. Pregravid weight 350 lb (158.8 kg) Total Weight Gain 10 lb 2 oz (4.593 kg) Urinalysis:      Fetal Status: Fetal Heart Rate (bpm): 130   Movement: Present     General:  Alert, oriented and cooperative. Patient is in no acute distress.  Skin:  Skin is warm and dry. No rash noted.   Cardiovascular: Normal heart rate noted  Respiratory: Normal respiratory effort, no problems with respiration noted  Abdomen: Soft, gravid, appropriate for gestational age. Pain/Pressure: Present     Pelvic:  Cervical exam deferred        Extremities: Normal range of motion.     ental Status: Normal mood and affect. Normal behavior. Normal judgment and thought content.   Baseline: 130 Variability: moderate Accelerations: present Decelerations: absent Tocometry: N/A The patient was monitored for 30 minutes, fetal heart rate tracing was deemed reactive, category I tracing,  CPT [id number]   Assessment   30 y.o. [id number] at [redacted]w[redacted]d by  07/24/2020, by Last Menstrual Period presenting for routine prenatal visit  Plan   FOURTH Problems (from 12/28/19 to present)    Problem Noted Resolved   Supervision of high risk pregnancy, antepartum 12/31/2019 by Vena Austria, MD No   Overview Addendum 01/25/2020 12:53 PM by Vena Austria, MD    [facility]inic [facility] Prenatal Labs  Dating LMP = 6week [place] Blood type: O/Positive/-- (07/23 1457)   Genetic Screen NIPS: normal XY Fragile X neg, CF neg, SMA neg Antibody:Negative (07/23 1457)  Anatomic [place]  Rubella: 16.40 (07/23 1457) Varicella: Immune  GTT N/A DMII, baseline HgbA1C 9.4 RPR: Non Reactive (07/23 1457)   Rhogam N/A HBsAg: Negative (07/23 1457)   TDaP vaccine                       Flu Shot: HIV: Non Reactive (07/23 1457)   Baby Food                                GBS:   Contraception  Pap: 12/28/2019 NILM  CBB     CS/VBAC N/A   Support Person Husband [name]  Previous Version   Asthma during pregnancy 12/31/2019 by Vena Austria, MD No   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 by Vena Austria, MD No   Overview Addendum 12/31/2019 10:53 PM by Vena Austria, MD    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 A2/B GDM)  For A2/B GDM or higher classes of DM [X]  Diabetes Education and Testing Supplies [X]  Nutrition Counsult [ ]  Fetal ECHO after 22-24 weeks  [ ]  Eye exam for retina evaluation  [ ]  Baseline EKG [ ]  fetal growth every 4 weeks starting at 28 weeks [ ]  Twice weekly NST starting at [redacted] weeks gestation [ ]  Delivery planning contingent on fetal growth, AFI, glycemic control, and other co-morbidities but at least by 39 weeks  Baseline and surveillance labs (pulled in from [facility], refresh links as needed)  Lab Results  Component Value Date   CREATININE 0.53 (L) 12/28/2019   AST 14 12/28/2019   ALT 14 12/28/2019   TSH 1.390 09/20/2016   Lab Results  Component Value Date   HGBA1C 9.4 (H) 12/28/2019   HGBA1C 6.6 (H) 09/20/2016    Antenatal Testing Class of DM U/S NST/AFI DELIVERY  Diabetes   A1 - good control - O24.410    A2 - good control - O24.419      A2  - poor control or poor compliance - O24.419, E11.65   (Macrosomia or polyhydramnios) **E11.65 is extra code for poor control**    A2/B - O24.919  and B-C O24.319  Poor control B-C or D-R-F-T - O24.319  or  Type I DM - O24.019  20-38  20-38  20-24-28-32-36   20-24-28-32-35-38//fetal echo  20-24-27-30-33-36-38//fetal echo  40  32//2 x wk  32//2 x wk   32//2 x wk  28//BPP wkly then 32//2 x wk  40  39  PRN   39  PRN          Maternal obesity, antepartum 12/28/2019 by 09/22/2016, MD No   First trimester bleeding 12/31/2019 by 09/22/2016, MD 03/26/2020 by 03-08-1971, MD       Gestational age appropriate obstetric precautions including but not limited to vaginal bleeding, contractions, leaking of fluid and fetal movement were reviewed in detail with the  patient.    1) DM2 reports fasting BG in the 90 range with postprandial mostly around 140's.  Urine dip today only trace glucose which is an improvement.   - continue twice weekly APT - given insulin requirement will plan on IOL in the 37th week if EFW <4000g, set for 07/05/20   Return in about 2 days (around 06/13/2020) for ROB NST AFI ultrasound 1/7, ROB NST 1/11, ROB NST AFI 1/14.  01/02/2020, MD, Vena Austria [facility], [facility] 06/11/2020, 11:51 AM

## 2020-06-13 ENCOUNTER — Encounter: Payer: Medicaid Other | Admitting: Obstetrics and Gynecology

## 2020-06-13 ENCOUNTER — Encounter: Payer: Self-pay | Admitting: Obstetrics and Gynecology

## 2020-06-13 ENCOUNTER — Ambulatory Visit (INDEPENDENT_AMBULATORY_CARE_PROVIDER_SITE_OTHER): Payer: Medicaid Other | Admitting: Obstetrics and Gynecology

## 2020-06-13 ENCOUNTER — Ambulatory Visit (INDEPENDENT_AMBULATORY_CARE_PROVIDER_SITE_OTHER): Payer: Medicaid Other

## 2020-06-13 ENCOUNTER — Other Ambulatory Visit: Payer: Self-pay | Admitting: Obstetrics and Gynecology

## 2020-06-13 ENCOUNTER — Other Ambulatory Visit: Payer: Self-pay

## 2020-06-13 ENCOUNTER — Observation Stay
Admission: EM | Admit: 2020-06-13 | Discharge: 2020-06-14 | Disposition: A | Discharge status: Home / Self Care | Payer: Medicaid Other | Attending: Obstetrics and Gynecology | Admitting: Obstetrics and Gynecology

## 2020-06-13 VITALS — BP 126/76 | HR 62 | Wt 363.0 lb

## 2020-06-13 DIAGNOSIS — O0990 Supervision of high risk pregnancy, unspecified, unspecified trimester: Secondary | ICD-10-CM

## 2020-06-13 DIAGNOSIS — O99210 Obesity complicating pregnancy, unspecified trimester: Secondary | ICD-10-CM | POA: Diagnosis not present

## 2020-06-13 DIAGNOSIS — Z3A34 34 weeks gestation of pregnancy: Secondary | ICD-10-CM

## 2020-06-13 DIAGNOSIS — O99519 Diseases of the respiratory system complicating pregnancy, unspecified trimester: Secondary | ICD-10-CM

## 2020-06-13 DIAGNOSIS — N898 Other specified noninflammatory disorders of vagina: Secondary | ICD-10-CM | POA: Insufficient documentation

## 2020-06-13 DIAGNOSIS — Z87891 Personal history of nicotine dependence: Secondary | ICD-10-CM | POA: Insufficient documentation

## 2020-06-13 DIAGNOSIS — O24113 Pre-existing diabetes mellitus, type 2, in pregnancy, third trimester: Secondary | ICD-10-CM | POA: Diagnosis not present

## 2020-06-13 DIAGNOSIS — O26893 Other specified pregnancy related conditions, third trimester: Secondary | ICD-10-CM | POA: Diagnosis not present

## 2020-06-13 DIAGNOSIS — O26899 Other specified pregnancy related conditions, unspecified trimester: Secondary | ICD-10-CM | POA: Diagnosis present

## 2020-06-13 DIAGNOSIS — Z79899 Other long term (current) drug therapy: Secondary | ICD-10-CM | POA: Diagnosis not present

## 2020-06-13 DIAGNOSIS — R197 Diarrhea, unspecified: Secondary | ICD-10-CM | POA: Diagnosis not present

## 2020-06-13 DIAGNOSIS — Z7982 Long term (current) use of aspirin: Secondary | ICD-10-CM | POA: Diagnosis not present

## 2020-06-13 DIAGNOSIS — O0993 Supervision of high risk pregnancy, unspecified, third trimester: Secondary | ICD-10-CM

## 2020-06-13 DIAGNOSIS — E1165 Type 2 diabetes mellitus with hyperglycemia: Secondary | ICD-10-CM | POA: Diagnosis not present

## 2020-06-13 DIAGNOSIS — Z20822 Contact with and (suspected) exposure to covid-19: Secondary | ICD-10-CM | POA: Diagnosis not present

## 2020-06-13 DIAGNOSIS — J45909 Unspecified asthma, uncomplicated: Secondary | ICD-10-CM

## 2020-06-13 DIAGNOSIS — O24119 Pre-existing diabetes mellitus, type 2, in pregnancy, unspecified trimester: Secondary | ICD-10-CM

## 2020-06-13 DIAGNOSIS — R102 Pelvic and perineal pain: Secondary | ICD-10-CM | POA: Diagnosis not present

## 2020-06-13 DIAGNOSIS — Z3689 Encounter for other specified antenatal screening: Secondary | ICD-10-CM

## 2020-06-13 DIAGNOSIS — Z3A32 32 weeks gestation of pregnancy: Secondary | ICD-10-CM

## 2020-06-13 DIAGNOSIS — R109 Unspecified abdominal pain: Secondary | ICD-10-CM | POA: Diagnosis not present

## 2020-06-13 DIAGNOSIS — Z794 Long term (current) use of insulin: Secondary | ICD-10-CM | POA: Diagnosis not present

## 2020-06-13 DIAGNOSIS — O4193X0 Disorder of amniotic fluid and membranes, unspecified, third trimester, not applicable or unspecified: Secondary | ICD-10-CM | POA: Diagnosis not present

## 2020-06-13 DIAGNOSIS — Q999 Chromosomal abnormality, unspecified: Secondary | ICD-10-CM

## 2020-06-13 LAB — FETAL NONSTRESS TEST

## 2020-06-13 LAB — RUPTURE OF MEMBRANE (ROM)PLUS: Rom Plus: NEGATIVE

## 2020-06-13 LAB — WET PREP, GENITAL
Clue Cells Wet Prep HPF POC: NONE SEEN
Sperm: NONE SEEN
Trich, Wet Prep: NONE SEEN
Yeast Wet Prep HPF POC: NONE SEEN

## 2020-06-13 LAB — GLUCOSE, CAPILLARY: Glucose-Capillary: 166 mg/dL — ABNORMAL HIGH (ref 70–99)

## 2020-06-13 MED ORDER — DIPHENOXYLATE-ATROPINE 2.5-0.025 MG PO TABS
2.0000 | ORAL_TABLET | Freq: Four times a day (QID) | ORAL | Status: DC | PRN
  Administered 2020-06-14: 2 via ORAL
  Filled 2020-06-13: qty 2

## 2020-06-13 MED ORDER — INSULIN GLARGINE 100 UNIT/ML SOLOSTAR PEN
95.0000 [IU] | PEN_INJECTOR | Freq: Every day | SUBCUTANEOUS | 11 refills | Status: DC
Start: 2020-06-13 — End: 2020-07-08

## 2020-06-13 MED ORDER — HUMULIN R U-500 KWIKPEN 500 UNIT/ML ~~LOC~~ SOPN: 35.0000 [IU] | PEN_INJECTOR | Freq: Three times a day (TID) | SUBCUTANEOUS | 6 refills | Status: DC

## 2020-06-13 NOTE — H&P (Signed)
Yvonne Rios is an 30 y.o. female.   Chief Complaint: Vaginal pressure HPI: She presents to L&D with complaints of vaginal pressure, and vulvovaginal burning. She repots that this started abruptly at 1700 today. After arrival she noted leakage of clear fluid when she used the restroom. She reports feeling uterine contractions and lower back pain as well. She reports 3 episodes of diarrhea today. Last episode was 1 hour ago.   She is a poorly controlled type 2 diabetic. She reports that she last checked her blood glucose this AM.  Her insulin regimen was increased today to Lantus 95 units and humolog 35 units TID with meals.    FOURTH Problems (from 12/28/19 to present)    Problem Noted Resolved   Supervision of high risk pregnancy, antepartum 12/31/2019 by Malachy Mood, MD No   Overview Addendum 01/25/2020 12:53 PM by Malachy Mood, MD    [facility]inic [facility] Prenatal Labs  Dating LMP = 6week [place] Blood type: O/Positive/-- (07/23 1457)   Genetic Screen NIPS: normal XY Fragile X neg, CF neg, SMA neg Antibody:Negative (07/23 1457)  Anatomic [place] complete Rubella: 16.40 (07/23 1457) Varicella: Immune  GTT N/A DMII, baseline HgbA1C 9.4 RPR: Non Reactive (07/23 1457)   Rhogam N/A HBsAg: Negative (07/23 1457)   TDaP vaccine                        Flu Shot:03/20/2020 HIV: Non Reactive (07/23 1457)   Baby Food                                GBS:   Contraception  Pap: 12/28/2019 NILM  CBB     CS/VBAC N/A   Support Person Husband [name]           Previous Version   Asthma during pregnancy 12/31/2019 by Malachy Mood, MD No   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 by Malachy Mood, MD No   Overview Addendum 12/31/2019 10:53 PM by Malachy Mood, MD    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 A2/B GDM)  For A2/B GDM or higher classes of DM _1  Diabetes Education and Testing Supplies _2  Nutrition Counsult _3  Fetal ECHO after 22-24 weeks  _4  Eye exam for retina evaluation  _5  Baseline EKG _6  US fetal growth every 4 weeks starting at 28 weeks _7  Twice weekly NST starting at [redacted] weeks gestation _8  Delivery planning contingent on fetal growth, AFI, glycemic control, and other co-morbidities but at least by 39 weeks  Baseline and surveillance labs (pulled in from [facility], refresh links as needed)  Lab Results  Component Value Date   CREATININE 0.53 (L) 12/28/2019   AST 14 12/28/2019   ALT 14 12/28/2019   TSH 1.390 09/20/2016   Lab Results  Component Value Date   HGBA1C 9.4 (H) 12/28/2019   HGBA1C 6.6 (H) 09/20/2016    Antenatal Testing Class of DM U/S NST/AFI DELIVERY  Diabetes   A1 - good control - O24.410    A2 - good control - O24.419      A2  -  poor control or poor compliance - O24.419, E11.65   (Macrosomia or polyhydramnios) **E11.65 is extra code for poor control**    A2/B - O24.919  and B-C O24.319  Poor control B-C or D-R-F-T - O24.319  or  Type I DM - O24.019  20-38  20-38  20-24-28-32-36   20-24-28-32-35-38//fetal echo  20-24-27-30-33-36-38//fetal echo  40  32//2 x wk  32//2 x wk   32//2 x wk  28//BPP wkly then 32//2 x wk  40  39  PRN   39  PRN          Maternal obesity, antepartum 12/28/2019 by Malachy Mood, MD No   First trimester bleeding 12/31/2019 by Malachy Mood, MD 03/26/2020 by Malachy Mood, MD       Past Medical History:  Diagnosis Date  . ADD (attention deficit disorder)   . Anxiety   . Asthma   . Depression   . Diabetes mellitus without complication ([place])   . GERD (gastroesophageal reflux disease)   . IBS (irritable bowel syndrome)   . Migraine   . MRSA (methicillin  resistant staph aureus) culture positive   . Oligomenorrhea   . Pyloric stenosis   . UTI (urinary tract infection)     Past Surgical History:  Procedure Laterality Date  . COLPOSCOPY    . DILATION AND CURETTAGE OF UTERUS  age 50  . HYSTEROSCOPY  2015  . TONSILLECTOMY    . WISDOM TOOTH EXTRACTION      Family History  Problem Relation Age of Onset  . Ovarian cancer Mother 71  . Other Father    Social History:  reports that she has quit smoking. She has never used smokeless tobacco. She reports previous alcohol use. She reports that she does not use drugs.  Allergies:  Allergies  Allergen Reactions  . Cefprozil Other (See Comments)  . Amoxil [Amoxicillin] Rash  . Augmentin [Amoxicillin-Pot Clavulanate] Rash    Medications Prior to Admission  Medication Sig Dispense Refill  . Accu-Chek Softclix Lancets lancets 1 each by Other route 4 (four) times daily. Use as instructed 200 each 2  . ADDERALL XR 20 MG 24 hr capsule Take 1 capsule (20 mg total) by mouth 2 (two) times daily. 30 capsule 0  . albuterol (PROVENTIL HFA;VENTOLIN HFA) 108 (90 Base) MCG/ACT inhaler Inhale 2 puffs into the lungs every 4 (four) hours as needed for wheezing or shortness of breath. 1 Inhaler 0  . Blood Glucose Monitoring Suppl (ACCU-CHEK AVIVA PLUS) w/Device KIT 1 Units by Does not apply route in the morning, at noon, in the evening, and at bedtime. 1 kit 0  . cetirizine (ZYRTEC) 10 MG tablet Take 1 tablet (10 mg total) by mouth daily. 30 tablet 11  . clonazePAM (KLONOPIN) 1 MG tablet TAKE 1/2 TABLET BY MOUTH EVERY MORNING, TAKE 1/2 TABLET BY MOUTH EVERY AFTERNOON & TAKE ONE TABLET BY MOUTH AT BEDTIME 30 tablet 0  . cyclobenzaprine (FLEXERIL) 10 MG tablet Take 1 tablet (10 mg total) by mouth 3 (three) times daily as needed for muscle spasms. 30 tablet 2  . famotidine (PEPCID) 20 MG tablet Take 1 tablet (20 mg total) by mouth daily. 30 tablet 11  . Fluticasone-Salmeterol (ADVAIR) 250-50 MCG/DOSE AEPB Inhale 1  puff into the lungs daily. 60 each 3  . folic acid (FOLVITE) 1 MG tablet Take 1 tablet (1 mg total) by mouth daily. 30 tablet 10  . glucose blood test strip Use as instructed 100 each 12  . insulin glargine (LANTUS)  100 UNIT/ML Solostar Pen Inject 95 Units into the skin at bedtime. 60 mL 11  . insulin regular human CONCENTRATED (HUMULIN R U-500 KWIKPEN) 500 UNIT/ML kwikpen Inject 35 Units into the skin 3 (three) times daily before meals. 3 mL 6  . montelukast (SINGULAIR) 10 MG tablet Take 1 tablet (10 mg total) by mouth at bedtime. 30 tablet 11  . pantoprazole (PROTONIX) 40 MG tablet Take 1 tablet (40 mg total) by mouth daily. 30 tablet 11  . sertraline (ZOLOFT) 50 MG tablet Take 1 tablet (50 mg total) by mouth daily. 30 tablet 2  . Spacer/Aero-Holding Chambers (AEROCHAMBER PLUS) inhaler Use as instructed 1 each 2  . SYMBICORT 160-4.5 MCG/ACT inhaler Inhale 2 puffs into the lungs in the morning and at bedtime.    . ALLERGIST TRAY 1CC 27GX3/8" 27G X 3/8" 1 ML KIT USE AS DIRECTED PER MD (Patient not taking: Reported on 06/13/2020)    . aspirin EC 81 MG tablet Take 1 tablet (81 mg total) by mouth daily. Take after 12 weeks for prevention of preeclampssia later in pregnancy 300 tablet 2  . azithromycin (ZITHROMAX) 250 MG tablet 2 tablets on day 1, then 1 tablet daily on days 2-5. (Patient not taking: No sig reported) 6 tablet 0  . EPINEPHrine 0.3 mg/0.3 mL IJ SOAJ injection USE AS DIRECTED PER MD FOR ANAPHYLAXIS (Patient not taking: No sig reported)    . Insulin Pen Needle (NOVOFINE) 32G X 6 MM MISC 1 Units by Does not apply route in the morning, at noon, in the evening, and at bedtime. (Patient not taking: Reported on 06/13/2020) 200 each 3    No results found for this or any previous visit (from the past 48 hour(s)). US OB Limited  Result Date: 06/13/2020 Patient Name: Yvonne Rios DOB: 09-Aug-1990 MRN: [id number] ULTRASOUND REPORT Location: [facility] Date of Service: 06/13/2020 Indications:AFI  Findings: [place] intrauterine pregnancy is visualized with FHR at 132 BPM. Fetal presentation is [place]. Placenta: posterior. Grade: 2 AFI: 14.6 cm Impression: 1. [redacted]w[redacted]d Viable [place]leton Intrauterine pregnancy dated by previously established criteria. 2. AFI is 14.6 cm. Deanna Artis, RT The ultrasound images and findings were reviewed by me and I agree with the above report. Thomasene Mohair, MD, [facility], [facility] 06/13/2020 11:43 AM      Review of Systems  Constitutional: Negative for chills and fever.  HENT: Negative for congestion, hearing loss and sinus pain.   Respiratory: Negative for cough, shortness of breath and wheezing.   Cardiovascular: Negative for chest pain, palpitations and leg swelling.  Gastrointestinal: Positive for abdominal pain and diarrhea. Negative for constipation, nausea and vomiting.  Genitourinary: Negative for dysuria, flank pain, frequency, hematuria and urgency.  Musculoskeletal: Negative for back pain.  Skin: Negative for rash.  Neurological: Negative for dizziness and headaches.  Psychiatric/Behavioral: Negative for suicidal ideas. The patient is not nervous/anxious.    Last menstrual period 10/18/2019. Physical Exam Vitals and nursing note reviewed.  Constitutional:      Appearance: She is well-developed and well-nourished.  HENT:     Head: Normocephalic and atraumatic.  Cardiovascular:     Rate and Rhythm: Normal rate and regular rhythm.  Pulmonary:     Effort: Pulmonary effort is normal.     Breath sounds: Normal breath sounds.  Abdominal:     General: Bowel sounds are normal.     Palpations: Abdomen is soft.     Comments: Gravid  Genitourinary:    Comments: SVE:  Limited by body habitus, 1-2/50/-3 External: Normal appearing vulva. No lesions noted.  Musculoskeletal:        General: Normal range of motion.  Skin:    General: Skin is warm and dry.  Neurological:     Mental Status: She is alert and  oriented to person, place, and time.  Psychiatric:        Mood and Affect: Mood and affect normal.        Behavior: Behavior normal.        Thought Content: Thought content normal.        Judgment: Judgment normal.      Assessment/Plan 30 yo G4P1021 [redacted]w[redacted]d 1. Vulvovaginal pain- Will check wet mount (normal) 2. Leakage of fluid- no sign of obvious rupture,  Rom plus negative 3. Diarrhea-will treat with lomotil, check for Covid (negative) 4. Elevated glucose- Continue home diabetic regimen.  5. No signs of active labor , reassuring fetal status  Patient reported pain had improved. Discharged home.   More than 30 minutes were spent face to face with the patient in the room, reviewing the medical record, labs and images, and coordinating care for the patient. The plan of management was discussed in detail and counseling was provided.      CHomero Fellers MD 06/13/2020, 11:09 PM

## 2020-06-13 NOTE — OB Triage Note (Signed)
Pt is a G4P2 and [redacted]w[redacted]d presenting to L&D with c/o vaginal pressure with activity and burning. Pt states this started around 1700 today. Pt states she has not done any interventions at home to alleviate symptoms. Pt rates pain 6/10 on a 0-10 pain scale. Pt states when she went to the bathroom in the triage room she noticed her underwear was "wet and it didn't look like urine." Monitors applied and assessing. VSS. Pt denies VB and denies recent intercourse. Pt states she hasn't felt baby move like normal in the "last couple hours."

## 2020-06-13 NOTE — Progress Notes (Signed)
Routine Prenatal Care Visit  Subjective  Yvonne Rios is a 30 y.o. G4P1021 at [redacted]w[redacted]d being seen today for ongoing prenatal care.  She is currently monitored for the following issues for this high-risk pregnancy and has Anxiety; Asthma; Depression; Environmental allergies; Functional constipation; GERD (gastroesophageal reflux disease); Insomnia; Left-sided low back pain with sciatica; Migraine without status migrainosus, not intractable; Mild intermittent asthma without complication; Morbid obesity due to excess calories (HCC); Myopia, left eye; Pre-existing type 2 diabetes mellitus during pregnancy, antepartum; Maternal obesity, antepartum; Supervision of high risk pregnancy, antepartum; Asthma during pregnancy; Chromosome abnormality; Attention deficit disorder (ADD) in adult; Uncontrolled type 2 diabetes mellitus with hyperglycemia (HCC); and Pre-existing type 2 diabetes mellitus with hyperglycemia during pregnancy in third trimester [facility]) on their problem list.  ----------------------------------------------------------------------------------- Patient reports no complaints.   Contractions: Not present. Vag. Bleeding: None.  Movement: Present. Leaking Fluid denies.  GDMA2: she brings her glucose log, which is on her phone. Her measurements are not fully complete and some measurements are at times like 1am.  She has mostly elevated values and ranges as high as the 200s.  There are virtually no normal values. AFI today is normal. Fetus is [employer] ----------------------------------------------------------------------------------- The following portions of the patient's history were reviewed and updated as appropriate: allergies, current medications, past family history, past medical history, past social history, past surgical history and problem list. Problem list updated.  Objective  Blood pressure 126/76, pulse 62, weight (!) 363 lb (164.7 kg), last menstrual period  10/18/2019. Pregravid weight 350 lb (158.8 kg) Total Weight Gain 13 lb (5.897 kg) Urinalysis: Urine Protein    Urine Glucose    Fetal Status:     Movement: Present     General:  Alert, oriented and cooperative. Patient is in no acute distress.  Skin: Skin is warm and dry. No rash noted.   Cardiovascular: Normal heart rate noted  Respiratory: Normal respiratory effort, no problems with respiration noted  Abdomen: Soft, gravid, appropriate for gestational age. Pain/Pressure: Present     Pelvic:  Cervical exam deferred        Extremities: Normal range of motion.     Mental Status: Normal mood and affect. Normal behavior. Normal judgment and thought content.   NST: Baseline FHR: 135 beats/min Variability: moderate Accelerations: present Decelerations: absent Tocometry: not done  Interpretation:  INDICATIONS: diabetes mellitus, morbid obesity in pregnancy (BMI >50) RESULTS:  A NST procedure was performed with FHR monitoring and a normal baseline established, appropriate time of 20-40 minutes of evaluation, and accels >2 seen w 15x15 characteristics.  Results show a REACTIVE NST.    Assessment   30 y.o. F7P1025 at [redacted]w[redacted]d by  07/24/2020, by Last Menstrual Period presenting for routine prenatal visit  Plan   FOURTH Problems (from 12/28/19 to present)    Problem Noted Resolved   Supervision of high risk pregnancy, antepartum 12/31/2019 by Vena Austria, MD No   Overview Addendum 01/25/2020 12:53 PM by Vena Austria, MD    [facility] Prenatal Labs  Dating LMP = 6week [place] Blood type: O/Positive/-- (07/23 1457)   Genetic Screen NIPS: normal XY Fragile X neg, CF neg, SMA neg Antibody:Negative (07/23 1457)  Anatomic [place]  Rubella: 16.40 (07/23 1457) Varicella: Immune  GTT N/A DMII, baseline HgbA1C 9.4 RPR: Non Reactive (07/23 1457)   Rhogam N/A HBsAg: Negative (07/23 1457)   TDaP vaccine  Flu Shot: HIV: Non Reactive (07/23 1457)   Baby Food                                 GBS:   Contraception  Pap: 12/28/2019 NILM  CBB     CS/VBAC N/A   Support Person Husband [name]           Previous Version   Asthma during pregnancy 12/31/2019 by Vena Austria, MD No   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 by Vena Austria, MD No   Overview Addendum 12/31/2019 10:53 PM by Vena Austria, MD    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 A2/B GDM)  For A2/B GDM or higher classes of DM [X]  Diabetes Education and Testing Supplies [X]  Nutrition Counsult [ ]  Fetal ECHO after 22-24 weeks  [ ]  Eye exam for retina evaluation  [ ]  Baseline EKG [ ]  fetal growth every 4 weeks starting at 28 weeks [ ]  Twice weekly NST starting at [redacted] weeks gestation [ ]  Delivery planning contingent on fetal growth, AFI, glycemic control, and other co-morbidities but at least by 39 weeks  Baseline and surveillance labs (pulled in from [facility], refresh links as needed)  Lab Results  Component Value Date   CREATININE 0.53 (L) 12/28/2019   AST 14 12/28/2019   ALT 14 12/28/2019   TSH 1.390 09/20/2016   Lab Results  Component Value Date   HGBA1C 9.4 (H) 12/28/2019   HGBA1C 6.6 (H) 09/20/2016    Antenatal Testing Class of DM U/S NST/AFI DELIVERY  Diabetes   A1 - good control - O24.410    A2 - good control - O24.419      A2  - poor control or poor compliance - O24.419, E11.65   (Macrosomia or polyhydramnios) **E11.65 is extra code for poor control**    A2/B - O24.919  and B-C O24.319  Poor control B-C or D-R-F-T - O24.319  or  Type I DM - O24.019  20-38  20-38  20-24-28-32-36   20-24-28-32-35-38//fetal echo  20-24-27-30-33-36-38//fetal echo  40  32//2  x wk  32//2 x wk   32//2 x wk  28//BPP wkly then 32//2 x wk  40  39  PRN   39  PRN          Maternal obesity, antepartum 12/28/2019 by 09/22/2016, MD No   First trimester bleeding 12/31/2019 by 09/22/2016, MD 03/26/2020 by 03-08-1971, MD       Preterm labor symptoms and general obstetric precautions including but not limited to vaginal bleeding, contractions, leaking of fluid and fetal movement were reviewed in detail with the patient. Please refer to After Visit Summary for other counseling recommendations.   - discussed IOL and possible ECV versus c-section if baby does not change position. She will discuss with Dr. 03-05-1989 who is inducing her.   - increase lantus to 95 units, humolog to 35 units TIC AC.  Return for Keep previously scheduled appts.   05-28-1983, MD, 09-04-2004 OB/GYN, [facility] 06/13/2020 12:31 PM

## 2020-06-14 DIAGNOSIS — R197 Diarrhea, unspecified: Secondary | ICD-10-CM | POA: Diagnosis not present

## 2020-06-14 DIAGNOSIS — J45909 Unspecified asthma, uncomplicated: Secondary | ICD-10-CM | POA: Diagnosis not present

## 2020-06-14 DIAGNOSIS — E1165 Type 2 diabetes mellitus with hyperglycemia: Secondary | ICD-10-CM | POA: Diagnosis not present

## 2020-06-14 DIAGNOSIS — Z794 Long term (current) use of insulin: Secondary | ICD-10-CM

## 2020-06-14 DIAGNOSIS — Z3A34 34 weeks gestation of pregnancy: Secondary | ICD-10-CM | POA: Diagnosis not present

## 2020-06-14 DIAGNOSIS — Z87891 Personal history of nicotine dependence: Secondary | ICD-10-CM | POA: Diagnosis not present

## 2020-06-14 DIAGNOSIS — O4193X0 Disorder of amniotic fluid and membranes, unspecified, third trimester, not applicable or unspecified: Secondary | ICD-10-CM | POA: Diagnosis not present

## 2020-06-14 DIAGNOSIS — Z7982 Long term (current) use of aspirin: Secondary | ICD-10-CM | POA: Diagnosis not present

## 2020-06-14 DIAGNOSIS — R109 Unspecified abdominal pain: Secondary | ICD-10-CM

## 2020-06-14 DIAGNOSIS — N898 Other specified noninflammatory disorders of vagina: Secondary | ICD-10-CM | POA: Diagnosis not present

## 2020-06-14 DIAGNOSIS — Z20822 Contact with and (suspected) exposure to covid-19: Secondary | ICD-10-CM | POA: Diagnosis not present

## 2020-06-14 DIAGNOSIS — O24113 Pre-existing diabetes mellitus, type 2, in pregnancy, third trimester: Secondary | ICD-10-CM

## 2020-06-14 DIAGNOSIS — R102 Pelvic and perineal pain: Secondary | ICD-10-CM | POA: Diagnosis not present

## 2020-06-14 DIAGNOSIS — O26893 Other specified pregnancy related conditions, third trimester: Principal | ICD-10-CM

## 2020-06-14 DIAGNOSIS — Z79899 Other long term (current) drug therapy: Secondary | ICD-10-CM | POA: Diagnosis not present

## 2020-06-14 LAB — RESP PANEL BY RT-PCR (FLU A&B, COVID) ARPGX2
Influenza A by PCR: NEGATIVE
Influenza B by PCR: NEGATIVE
SARS Coronavirus 2 by RT PCR: NEGATIVE

## 2020-06-14 LAB — FETAL FIBRONECTIN: Fetal Fibronectin: NEGATIVE

## 2020-06-14 MED ORDER — DIPHENOXYLATE-ATROPINE 2.5-0.025 MG PO TABS: 2.0000 | ORAL_TABLET | Freq: Four times a day (QID) | ORAL | 0 refills | Status: DC | PRN

## 2020-06-14 NOTE — OB Triage Note (Signed)
Discharge instructions reviewed and pain management discussed. Pt verbalized understanding. Pt stable at time of discharge with significant other.

## 2020-06-14 NOTE — Discharge Summary (Signed)
Physician Discharge Summary  Patient ID: Yvonne Rios MRN: [id number] DOB/AGE: 10-18-1990 30 y.o.  Admit date: 06/13/2020 Discharge date: 06/14/2020  Admission Diagnoses:  Discharge Diagnoses:  Active Problems:   Vaginal discharge during pregnancy   Discharged Condition: good  Hospital Course: Patient was evaluated for vulvovaginal pain and leakage of fluid. Evaluation was negative. Patient was reassured. She was able to be discharged home in stable condition. Rx for lomotil sent.   Consults: None  Significant Diagnostic Studies: labs: see [facility]  Treatments: lomotil  Discharge Exam: Last menstrual period 10/18/2019. General appearance: alert and cooperative Resp: clear to auscultation bilaterally Chest wall: no tenderness Cardio: regular rate and rhythm, S1, S2 normal, no murmur, click, rub or gallop GI: soft, non-tender; bowel sounds normal; no masses,  no organomegaly Extremities: extremities normal, atraumatic, no cyanosis or edema Skin: Skin color, texture, turgor normal. No rashes or lesions  Disposition: Discharge disposition: 01-Home or Self Care        Allergies as of 06/14/2020      Reactions   Cefprozil Other (See Comments)   Amoxil [amoxicillin] Rash   Augmentin [amoxicillin-pot Clavulanate] Rash      Medication List    TAKE these medications   Accu-Chek Aviva Plus w/Device Kit 1 Units by Does not apply route in the morning, at noon, in the evening, and at bedtime.   Accu-Chek Softclix Lancets lancets 1 each by Other route 4 (four) times daily. Use as instructed   Adderall XR 20 MG 24 hr capsule Generic drug: amphetamine-dextroamphetamine Take 1 capsule (20 mg total) by mouth 2 (two) times daily.   AeroChamber Plus inhaler Use as instructed   albuterol 108 (90 Base) MCG/ACT inhaler Commonly known as: VENTOLIN HFA Inhale 2 puffs into the lungs every 4 (four) hours as needed for wheezing or shortness of breath.   ALLERGIST TRAY 1CC  27GX3/8" 27G X 3/8" 1 ML Kit Generic drug: Tuberculin-Allergy Syringes USE AS DIRECTED PER MD   aspirin EC 81 MG tablet Take 1 tablet (81 mg total) by mouth daily. Take after 12 weeks for prevention of preeclampssia later in pregnancy   azithromycin 250 MG tablet Commonly known as: ZITHROMAX 2 tablets on day 1, then 1 tablet daily on days 2-5.   cetirizine 10 MG tablet Commonly known as: ZYRTEC Take 1 tablet (10 mg total) by mouth daily.   clonazePAM 1 MG tablet Commonly known as: KLONOPIN TAKE 1/2 TABLET BY MOUTH EVERY MORNING, TAKE 1/2 TABLET BY MOUTH EVERY AFTERNOON & TAKE ONE TABLET BY MOUTH AT BEDTIME   cyclobenzaprine 10 MG tablet Commonly known as: FLEXERIL Take 1 tablet (10 mg total) by mouth 3 (three) times daily as needed for muscle spasms.   diphenoxylate-atropine 2.5-0.025 MG tablet Commonly known as: LOMOTIL Take 2 tablets by mouth 4 (four) times daily as needed for diarrhea or loose stools.   EPINEPHrine 0.3 mg/0.3 mL Soaj injection Commonly known as: EPI-PEN USE AS DIRECTED PER MD FOR ANAPHYLAXIS   famotidine 20 MG tablet Commonly known as: PEPCID Take 1 tablet (20 mg total) by mouth daily.   Fluticasone-Salmeterol 250-50 MCG/DOSE Aepb Commonly known as: ADVAIR Inhale 1 puff into the lungs daily.   folic acid 1 MG tablet Commonly known as: FOLVITE Take 1 tablet (1 mg total) by mouth daily.   glucose blood test strip Use as instructed   HumuLIN R U-500 KwikPen 500 UNIT/ML kwikpen Generic drug: insulin regular human CONCENTRATED Inject 35 Units into the skin 3 (three) times daily before meals.  insulin glargine 100 UNIT/ML Solostar Pen Commonly known as: LANTUS Inject 95 Units into the skin at bedtime.   montelukast 10 MG tablet Commonly known as: SINGULAIR Take 1 tablet (10 mg total) by mouth at bedtime.   NovoFine 32G X 6 MM Misc Generic drug: Insulin Pen Needle 1 Units by Does not apply route in the morning, at noon, in the evening, and at  bedtime.   pantoprazole 40 MG tablet Commonly known as: PROTONIX Take 1 tablet (40 mg total) by mouth daily.   sertraline 50 MG tablet Commonly known as: Zoloft Take 1 tablet (50 mg total) by mouth daily.   Symbicort 160-4.5 MCG/ACT inhaler Generic drug: budesonide-formoterol Inhale 2 puffs into the lungs in the morning and at bedtime.        Signed: Homero Fellers 06/14/2020, 12:41 AM

## 2020-06-16 ENCOUNTER — Other Ambulatory Visit: Payer: Self-pay | Admitting: Obstetrics and Gynecology

## 2020-06-16 ENCOUNTER — Other Ambulatory Visit: Payer: Self-pay

## 2020-06-16 DIAGNOSIS — J4520 Mild intermittent asthma, uncomplicated: Secondary | ICD-10-CM

## 2020-06-16 DIAGNOSIS — O24119 Pre-existing diabetes mellitus, type 2, in pregnancy, unspecified trimester: Secondary | ICD-10-CM

## 2020-06-16 DIAGNOSIS — O0990 Supervision of high risk pregnancy, unspecified, unspecified trimester: Secondary | ICD-10-CM

## 2020-06-16 DIAGNOSIS — J45909 Unspecified asthma, uncomplicated: Secondary | ICD-10-CM

## 2020-06-16 DIAGNOSIS — O99210 Obesity complicating pregnancy, unspecified trimester: Secondary | ICD-10-CM

## 2020-06-16 DIAGNOSIS — E1165 Type 2 diabetes mellitus with hyperglycemia: Secondary | ICD-10-CM

## 2020-06-16 DIAGNOSIS — O99519 Diseases of the respiratory system complicating pregnancy, unspecified trimester: Secondary | ICD-10-CM

## 2020-06-16 DIAGNOSIS — Z3A34 34 weeks gestation of pregnancy: Secondary | ICD-10-CM

## 2020-06-16 DIAGNOSIS — Q999 Chromosomal abnormality, unspecified: Secondary | ICD-10-CM

## 2020-06-17 ENCOUNTER — Encounter: Payer: Self-pay | Admitting: Obstetrics and Gynecology

## 2020-06-17 ENCOUNTER — Other Ambulatory Visit: Payer: Self-pay

## 2020-06-17 ENCOUNTER — Observation Stay
Admission: EM | Admit: 2020-06-17 | Discharge: 2020-06-17 | Disposition: A | Discharge status: Home / Self Care | Payer: Medicaid Other | Attending: Obstetrics and Gynecology | Admitting: Obstetrics and Gynecology

## 2020-06-17 ENCOUNTER — Ambulatory Visit (INDEPENDENT_AMBULATORY_CARE_PROVIDER_SITE_OTHER): Payer: Medicaid Other | Admitting: Obstetrics

## 2020-06-17 ENCOUNTER — Other Ambulatory Visit: Payer: Self-pay | Admitting: Obstetrics and Gynecology

## 2020-06-17 VITALS — BP 116/72 | Wt 365.0 lb

## 2020-06-17 DIAGNOSIS — O09893 Supervision of other high risk pregnancies, third trimester: Secondary | ICD-10-CM | POA: Diagnosis not present

## 2020-06-17 DIAGNOSIS — Q999 Chromosomal abnormality, unspecified: Secondary | ICD-10-CM

## 2020-06-17 DIAGNOSIS — Z3A34 34 weeks gestation of pregnancy: Secondary | ICD-10-CM

## 2020-06-17 DIAGNOSIS — O24119 Pre-existing diabetes mellitus, type 2, in pregnancy, unspecified trimester: Secondary | ICD-10-CM

## 2020-06-17 DIAGNOSIS — O24113 Pre-existing diabetes mellitus, type 2, in pregnancy, third trimester: Secondary | ICD-10-CM | POA: Diagnosis not present

## 2020-06-17 DIAGNOSIS — O0990 Supervision of high risk pregnancy, unspecified, unspecified trimester: Secondary | ICD-10-CM

## 2020-06-17 DIAGNOSIS — O99210 Obesity complicating pregnancy, unspecified trimester: Secondary | ICD-10-CM

## 2020-06-17 DIAGNOSIS — J45909 Unspecified asthma, uncomplicated: Secondary | ICD-10-CM

## 2020-06-17 DIAGNOSIS — O99519 Diseases of the respiratory system complicating pregnancy, unspecified trimester: Secondary | ICD-10-CM

## 2020-06-17 DIAGNOSIS — Z794 Long term (current) use of insulin: Secondary | ICD-10-CM | POA: Insufficient documentation

## 2020-06-17 DIAGNOSIS — O288 Other abnormal findings on antenatal screening of mother: Secondary | ICD-10-CM | POA: Diagnosis not present

## 2020-06-17 DIAGNOSIS — O368330 Maternal care for abnormalities of the fetal heart rate or rhythm, third trimester, not applicable or unspecified: Principal | ICD-10-CM | POA: Insufficient documentation

## 2020-06-17 DIAGNOSIS — O0993 Supervision of high risk pregnancy, unspecified, third trimester: Secondary | ICD-10-CM

## 2020-06-17 LAB — FETAL NONSTRESS TEST

## 2020-06-17 LAB — POCT URINALYSIS DIPSTICK OB: Glucose, UA: NEGATIVE

## 2020-06-17 LAB — GLUCOSE, CAPILLARY: Glucose-Capillary: 75 mg/dL (ref 70–99)

## 2020-06-17 MED ORDER — HUMULIN R U-500 KWIKPEN 500 UNIT/ML ~~LOC~~ SOPN: 35.0000 [IU] | PEN_INJECTOR | Freq: Three times a day (TID) | SUBCUTANEOUS | 2 refills | Status: DC

## 2020-06-17 MED ORDER — ADDERALL XR 20 MG PO CP24: 20.0000 mg | ORAL_CAPSULE | Freq: Two times a day (BID) | ORAL | 0 refills | Status: AC

## 2020-06-17 MED ORDER — CLONAZEPAM 0.5 MG PO TABS: 0.5000 mg | ORAL_TABLET | Freq: Two times a day (BID) | ORAL | 0 refills | Status: AC | PRN

## 2020-06-17 NOTE — OB Triage Note (Signed)
Pt discharged home per MD in stable condition. Discharge instructions given-pt verbalized understanding and had no questions or concerns at this time.

## 2020-06-17 NOTE — Telephone Encounter (Signed)
Please advise 

## 2020-06-17 NOTE — Progress Notes (Signed)
[facility] pharmacy called [phone]) stating humulin Rx needed to be written as #6 ml instead of #3 ml for insurance coverage. Rx changed and eRxd.

## 2020-06-17 NOTE — OB Triage Note (Signed)
Pt presented to triage from office for NST. She denies bleeding and LOF. Pt reports positive fetal movement and occasional contractions. Monitors applied and assessing.

## 2020-06-17 NOTE — Discharge Summary (Signed)
Physician Final Progress Note  Patient ID: Yvonne Rios MRN: [id number] DOB/AGE: 03-07-1991 30 y.o.  Admit date: 06/17/2020 Admitting provider: Malachy Mood, MD Discharge date: 06/17/2020   Admission Diagnoses: Non-reactive NST in office  Discharge Diagnoses:  Active Problems:   NST (non-stress test) nonreactive  30 y.o. P2R5188 at [redacted]w[redacted]d by Estimated Date of Delivery: 07/24/20 presenting from the office secondary to inability of obtaining a contiguous tracing.  Antepartum testing up until this point has always been reassuring.  On presentation the patient displayed a reactive NST, BG was 79 on fingerstick.  +FM, no LOF, no VB, irregular contractions.  Patient has follow up for AFI and APT in 2 days.  FOURTH Problems (from 12/28/19 to present)    Problem Noted Resolved   Supervision of high risk pregnancy, antepartum 12/31/2019 by Malachy Mood, MD No   Overview Addendum 06/13/2020 11:18 PM by Homero Fellers, MD    [facility] Prenatal Labs  Dating LMP = 6week [place] Blood type: O/Positive/-- (07/23 1457)   Genetic Screen NIPS: normal XY Fragile X neg, CF neg, SMA neg Antibody:Negative (07/23 1457)  Anatomic [place] complte Rubella: 16.40 (07/23 1457)  Varicella: Immune  GTT N/A DMII, baseline HgbA1C 9.4 RPR: Non Reactive (07/23 1457)   Rhogam N/A HBsAg: Negative (07/23 1457)   TDaP vaccine                        Flu Shot: 03/20/2020 HIV: Non Reactive (07/23 1457)   Baby Food                                GBS:   Contraception  Pap: 12/28/2019 NILM  CBB     CS/VBAC N/A   Support Person Husband [name]           Previous Version   Asthma during pregnancy 12/31/2019 by Malachy Mood, MD No   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 by Malachy Mood, MD No   Overview Addendum 12/31/2019 10:53 PM by Malachy Mood, MD    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 A2/B GDM)  For A2/B GDM or higher classes of DM [X]  Diabetes Education and Testing Supplies [X]  Nutrition Counsult [ ]  Fetal ECHO after 22-24 weeks  [ ]  Eye exam for retina evaluation  [ ]  Baseline EKG [ ]  US fetal growth every 4 weeks starting at 28 weeks [ ]  Twice weekly NST starting at [redacted] weeks gestation [ ]  Delivery planning contingent on fetal growth, AFI, glycemic control, and other co-morbidities but at least by 39 weeks  Baseline and surveillance labs (pulled in from [facility], refresh links as needed)  Lab Results  Component Value Date   CREATININE 0.53 (L) 12/28/2019   AST 14 12/28/2019   ALT 14 12/28/2019   TSH 1.390 09/20/2016   Lab Results  Component Value Date   HGBA1C 9.4 (H) 12/28/2019   HGBA1C 6.6 (H) 09/20/2016    Antenatal Testing Class of DM U/S NST/AFI DELIVERY  Diabetes   A1 - good control - O24.410    A2 - good control - O24.419  A2  - poor control or poor compliance - O24.419, E11.65   (Macrosomia or polyhydramnios) **E11.65 is extra code for poor control**    A2/B - O24.919  and B-C O24.319  Poor control B-C or D-R-F-T - O24.319  or  Type I DM - O24.019  20-38  20-38  20-24-28-32-36   20-24-28-32-35-38//fetal echo  20-24-27-30-33-36-38//fetal echo  40  32//2 x wk  32//2 x wk   32//2 x wk  28//BPP wkly then 32//2 x wk  40  39  PRN   39  PRN          Maternal obesity, antepartum 12/28/2019 by Malachy Mood, MD No   First trimester bleeding 12/31/2019 by Malachy Mood, MD 03/26/2020 by Malachy Mood, MD     Temperature 98.5 F (36.9 C), temperature source Oral, resp. rate 20, last menstrual period 10/18/2019.  Consults: None  Significant Findings/ Diagnostic Studies: { Results for orders  placed or performed in visit on 06/17/20 (from the past 24 hour(s))  POC Urinalysis Dipstick OB     Status: Normal   Collection Time: 06/17/20  2:29 PM  Result Value Ref Range   Color, UA     Clarity, UA     Glucose, UA Negative Negative   Bilirubin, UA     Ketones, UA     Spec Grav, UA     Blood, UA     pH, UA     POC,PROTEIN,UA Trace Negative, Trace, Small (1+), Moderate (2+), Large (3+), 4+   Urobilinogen, UA     Nitrite, UA     Leukocytes, UA     Appearance     Odor       Procedures:  Baseline: 130 Variability: moderate Accelerations: present Decelerations: absent Tocometry: none The patient was monitored for 30 minutes, fetal heart rate tracing was deemed reactive, category I tracing,  CPT [id number]   Discharge Condition: good  Disposition: Discharge disposition: 01-Home or Self Care       Diet: Diabetic diet  Discharge Activity: Activity as tolerated  Discharge Instructions    Discharge activity:  No Restrictions   Complete by: As directed    Discharge diet:  No restrictions   Complete by: As directed    Fetal Kick Count:  Lie on our left side for one hour after a meal, and count the number of times your baby kicks.  If it is less than 5 times, get up, move around and drink some juice.  Repeat the test 30 minutes later.  If it is still less than 5 kicks in an hour, notify your doctor.   Complete by: As directed    LABOR:  When conractions begin, you should start to time them from the beginning of one contraction to the beginning  of the next.  When contractions are 5 - 10 minutes apart or less and have been regular for at least an hour, you should call your health care provider.   Complete by: As directed    No sexual activity restrictions   Complete by: As directed    Notify physician for bleeding from the vagina   Complete by: As directed    Notify physician for blurring of vision or spots before the eyes   Complete by: As directed    Notify physician  for chills or fever   Complete by: As directed    Notify physician for fainting spells, "black outs" or loss of consciousness   Complete by: As directed  Notify physician for increase in vaginal discharge   Complete by: As directed    Notify physician for leaking of fluid   Complete by: As directed    Notify physician for pain or burning when urinating   Complete by: As directed    Notify physician for pelvic pressure (sudden increase)   Complete by: As directed    Notify physician for severe or continued nausea or vomiting   Complete by: As directed    Notify physician for sudden gushing of fluid from the vagina (with or without continued leaking)   Complete by: As directed    Notify physician for sudden, constant, or occasional abdominal pain   Complete by: As directed    Notify physician if baby moving less than usual   Complete by: As directed      Allergies as of 06/17/2020      Reactions   Cefprozil Other (See Comments)   Amoxil [amoxicillin] Rash   Augmentin [amoxicillin-pot Clavulanate] Rash      Medication List    TAKE these medications   Accu-Chek Aviva Plus w/Device Kit 1 Units by Does not apply route in the morning, at noon, in the evening, and at bedtime.   Accu-Chek Softclix Lancets lancets 1 each by Other route 4 (four) times daily. Use as instructed   Adderall XR 20 MG 24 hr capsule Generic drug: amphetamine-dextroamphetamine Take 1 capsule (20 mg total) by mouth 2 (two) times daily.   AeroChamber Plus inhaler Use as instructed   albuterol 108 (90 Base) MCG/ACT inhaler Commonly known as: VENTOLIN HFA Inhale 2 puffs into the lungs every 4 (four) hours as needed for wheezing or shortness of breath.   ALLERGIST TRAY 1CC 27GX3/8" 27G X 3/8" 1 ML Kit Generic drug: Tuberculin-Allergy Syringes   aspirin EC 81 MG tablet Take 1 tablet (81 mg total) by mouth daily. Take after 12 weeks for prevention of preeclampssia later in pregnancy   cetirizine 10 MG  tablet Commonly known as: ZYRTEC Take 1 tablet (10 mg total) by mouth daily.   clonazePAM 0.5 MG tablet Commonly known as: KLONOPIN Take 1 tablet (0.5 mg total) by mouth 2 (two) times daily as needed for anxiety. TAKE 1/2 TABLET BY MOUTH EVERY MORNING, TAKE 1/2 TABLET BY MOUTH EVERY AFTERNOON & TAKE ONE TABLET BY MOUTH AT BEDTIME   cyclobenzaprine 10 MG tablet Commonly known as: FLEXERIL Take 1 tablet (10 mg total) by mouth 3 (three) times daily as needed for muscle spasms.   diphenoxylate-atropine 2.5-0.025 MG tablet Commonly known as: LOMOTIL Take 2 tablets by mouth 4 (four) times daily as needed for diarrhea or loose stools.   EPINEPHrine 0.3 mg/0.3 mL Soaj injection Commonly known as: EPI-PEN USE AS DIRECTED PER MD FOR ANAPHYLAXIS   famotidine 20 MG tablet Commonly known as: PEPCID Take 1 tablet (20 mg total) by mouth daily.   Fluticasone-Salmeterol 250-50 MCG/DOSE Aepb Commonly known as: ADVAIR Inhale 1 puff into the lungs daily.   folic acid 1 MG tablet Commonly known as: FOLVITE Take 1 tablet (1 mg total) by mouth daily.   glucose blood test strip Use as instructed   HumuLIN R U-500 KwikPen 500 UNIT/ML kwikpen Generic drug: insulin regular human CONCENTRATED Inject 35 Units into the skin 3 (three) times daily before meals.   insulin glargine 100 UNIT/ML Solostar Pen Commonly known as: LANTUS Inject 95 Units into the skin at bedtime.   montelukast 10 MG tablet Commonly known as: SINGULAIR Take 1 tablet (10 mg total)  by mouth at bedtime.   NovoFine 32G X 6 MM Misc Generic drug: Insulin Pen Needle 1 Units by Does not apply route in the morning, at noon, in the evening, and at bedtime.   pantoprazole 40 MG tablet Commonly known as: PROTONIX Take 1 tablet (40 mg total) by mouth daily.   sertraline 50 MG tablet Commonly known as: Zoloft Take 1 tablet (50 mg total) by mouth daily.   Symbicort 160-4.5 MCG/ACT inhaler Generic drug:  budesonide-formoterol Inhale 2 puffs into the lungs in the morning and at bedtime.        Total time spent taking care of this patient: triaged remotely Signed: Malachy Mood 06/17/2020, 5:10 PM

## 2020-06-17 NOTE — Progress Notes (Signed)
Routine Prenatal Care Visit  Subjective  Yvonne Rios is a 30 y.o. G4P1021 at [redacted]w[redacted]d being seen today for ongoing prenatal care.  She is currently monitored for the following issues for this high-risk pregnancy and has Anxiety; Asthma; Depression; Environmental allergies; Functional constipation; GERD (gastroesophageal reflux disease); Insomnia; Left-sided low back pain with sciatica; Migraine without status migrainosus, not intractable; Mild intermittent asthma without complication; Morbid obesity due to excess calories (HCC); Myopia, left eye; Pre-existing type 2 diabetes mellitus during pregnancy, antepartum; Maternal obesity, antepartum; Supervision of high risk pregnancy, antepartum; Asthma during pregnancy; Chromosome abnormality; Attention deficit disorder (ADD) in adult; Uncontrolled type 2 diabetes mellitus with hyperglycemia (HCC); Pre-existing type 2 diabetes mellitus with hyperglycemia during pregnancy in third trimester [facility]); and Vaginal discharge during pregnancy on their problem list.  ----------------------------------------------------------------------------------- Patient reports backache, no bleeding, no cramping, no leaking and occasional contractions.  She c/o pressure and doesn't always pay attention to fetal movement.  Contractions: Irregular. Vag. Bleeding: None.  Movement: Present. Leaking Fluid denies.  ----------------------------------------------------------------------------------- The following portions of the patient's history were reviewed and updated as appropriate: allergies, current medications, past family history, past medical history, past social history, past surgical history and problem list. Problem list updated.  Objective  Blood pressure 116/72, weight (!) 365 lb (165.6 kg), last menstrual period 10/18/2019. Pregravid weight 350 lb (158.8 kg) Total Weight Gain 15 lb (6.804 kg) Urinalysis: Urine Protein Trace  Urine Glucose Negative  Fetal Status:      Movement: Present     General:  Alert, oriented and cooperative. Patient is in no acute distress.  Skin: Skin is warm and dry. No rash noted.   Cardiovascular: Normal heart rate noted  Respiratory: Normal respiratory effort, no problems with respiration noted  Abdomen: Soft, gravid, appropriate for gestational age. Pain/Pressure: Present     Pelvic:  Cervical exam deferred        Extremities: Normal range of motion.     Mental Status: Normal mood and affect. Normal behavior. Normal judgment and thought content.   Assessment   30 y.o. [id number] at [redacted]w[redacted]d by  07/24/2020, by Last Menstrual Period presenting for routine prenatal visit  Plan   FOURTH Problems (from 12/28/19 to present)    Problem Noted Resolved   Supervision of high risk pregnancy, antepartum 12/31/2019 by Vena Austria, MD No   Overview Addendum 06/13/2020 11:18 PM by Natale Milch, MD    Clinic [facility] Prenatal Labs  Dating LMP = 6week [place] Blood type: O/Positive/-- (07/23 1457)   Genetic Screen NIPS: normal XY Fragile X neg, CF neg, SMA neg Antibody:Negative (07/23 1457)  Anatomic [place] complte Rubella: 16.40 (07/23 1457)  Varicella: Immune  GTT N/A DMII, baseline HgbA1C 9.4 RPR: Non Reactive (07/23 1457)   Rhogam N/A HBsAg: Negative (07/23 1457)   TDaP vaccine                        Flu Shot: 03/20/2020 HIV: Non Reactive (07/23 1457)   Baby Food                                GBS:   Contraception  Pap: 12/28/2019 NILM  CBB     CS/VBAC N/A   Support Person Husband [name]           Previous Version   Asthma during pregnancy 12/31/2019 by Vena Austria, MD No   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 by Bonney Aid,  Carmel Sacramento, MD No   Overview Addendum 12/31/2019 10:53 PM by Vena Austria, MD    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 A2/B GDM)  For A2/B GDM or higher classes of DM [X]  Diabetes Education and Testing Supplies [X]  Nutrition Counsult [ ]  Fetal ECHO after 22-24 weeks  [ ]  Eye exam for retina evaluation  [ ]  Baseline EKG [ ]  fetal growth every 4 weeks starting at 28 weeks [ ]  Twice weekly NST starting at [redacted] weeks gestation [ ]  Delivery planning contingent on fetal growth, AFI, glycemic control, and other co-morbidities but at least by 39 weeks  Baseline and surveillance labs (pulled in from [facility], refresh links as needed)  Lab Results  Component Value Date   CREATININE 0.53 (L) 12/28/2019   AST 14 12/28/2019   ALT 14 12/28/2019   TSH 1.390 09/20/2016   Lab Results  Component Value Date   HGBA1C 9.4 (H) 12/28/2019   HGBA1C 6.6 (H) 09/20/2016    Antenatal Testing Class of DM U/S NST/AFI DELIVERY  Diabetes   A1 - good control - O24.410    A2 - good control - O24.419      A2  - poor control or poor compliance - O24.419, E11.65   (Macrosomia or polyhydramnios) **E11.65 is extra code for poor control**    A2/B - O24.919  and B-C O24.319  Poor control B-C or D-R-F-T - O24.319  or  Type I DM - O24.019  20-38  20-38  20-24-28-32-36   20-24-28-32-35-38//fetal echo  20-24-27-30-33-36-38//fetal echo  40  32//2 x wk  32//2 x wk   32//2 x wk  28//BPP wkly then 32//2 x wk  40  39  PRN   39  PRN          Maternal obesity, antepartum 12/28/2019 by 09/22/2016, MD No   First trimester bleeding 12/31/2019 by 09/22/2016, MD 03/26/2020 by 03-08-1971, MD     Difficulty tracing baby today for her NST. Audible fetal movement noted, and increase in FHTs after acoustic stimulation. Discussed the import of her taking her insulin as directed, and the increased risk for her baby with uncontrolled glucose levels. Strongly stressed  import of attending anesthesia appt and seeing MFM. Preterm labor symptoms and general obstetric precautions including but not limited to vaginal bleeding, contractions, leaking of fluid and fetal movement were reviewed in detail with the patient. Please refer to After Visit Summary for other counseling recommendations.   Return in about 2 days (around 06/19/2020), or ROB, NST.  [birth year]1983, CNM  06/17/2020 2:53 PM

## 2020-06-18 LAB — CULTURE, BETA STREP (GROUP B ONLY)

## 2020-06-20 ENCOUNTER — Encounter: Payer: Medicaid Other | Admitting: Obstetrics and Gynecology

## 2020-06-20 ENCOUNTER — Ambulatory Visit (INDEPENDENT_AMBULATORY_CARE_PROVIDER_SITE_OTHER): Payer: Medicaid Other | Admitting: Obstetrics and Gynecology

## 2020-06-20 ENCOUNTER — Other Ambulatory Visit: Payer: Medicaid Other

## 2020-06-20 ENCOUNTER — Other Ambulatory Visit: Payer: Self-pay

## 2020-06-20 ENCOUNTER — Ambulatory Visit (INDEPENDENT_AMBULATORY_CARE_PROVIDER_SITE_OTHER): Payer: Medicaid Other

## 2020-06-20 VITALS — BP 118/68 | Wt 367.0 lb

## 2020-06-20 DIAGNOSIS — B373 Candidiasis of vulva and vagina: Secondary | ICD-10-CM

## 2020-06-20 DIAGNOSIS — O99210 Obesity complicating pregnancy, unspecified trimester: Secondary | ICD-10-CM

## 2020-06-20 DIAGNOSIS — Z3689 Encounter for other specified antenatal screening: Secondary | ICD-10-CM

## 2020-06-20 DIAGNOSIS — O0990 Supervision of high risk pregnancy, unspecified, unspecified trimester: Secondary | ICD-10-CM

## 2020-06-20 DIAGNOSIS — E1165 Type 2 diabetes mellitus with hyperglycemia: Secondary | ICD-10-CM

## 2020-06-20 DIAGNOSIS — B3731 Acute candidiasis of vulva and vagina: Secondary | ICD-10-CM

## 2020-06-20 DIAGNOSIS — O24113 Pre-existing diabetes mellitus, type 2, in pregnancy, third trimester: Secondary | ICD-10-CM

## 2020-06-20 DIAGNOSIS — J45909 Unspecified asthma, uncomplicated: Secondary | ICD-10-CM

## 2020-06-20 DIAGNOSIS — O24119 Pre-existing diabetes mellitus, type 2, in pregnancy, unspecified trimester: Secondary | ICD-10-CM

## 2020-06-20 DIAGNOSIS — Z3A35 35 weeks gestation of pregnancy: Secondary | ICD-10-CM

## 2020-06-20 LAB — FETAL NONSTRESS TEST

## 2020-06-20 MED ORDER — TERCONAZOLE 0.4 % VA CREA: 1.0000 | TOPICAL_CREAM | Freq: Every day | VAGINAL | 1 refills | Status: DC

## 2020-06-20 NOTE — Progress Notes (Signed)
Routine Prenatal Care Visit  Subjective  Yvonne Rios is a 30 y.o. G4P1021 at 7843w1d being seen today for ongoing prenatal care.  She is currently monitored for the following issues for this high-risk pregnancy and has Anxiety; Asthma; Depression; Environmental allergies; Functional constipation; GERD (gastroesophageal reflux disease); Insomnia; Left-sided low back pain with sciatica; Migraine without status migrainosus, not intractable; Mild intermittent asthma without complication; Morbid obesity due to excess calories (HCC); Myopia, left eye; Pre-existing type 2 diabetes mellitus during pregnancy, antepartum; Maternal obesity, antepartum; Supervision of high risk pregnancy, antepartum; Asthma during pregnancy; Chromosome abnormality; Attention deficit disorder (ADD) in adult; Uncontrolled type 2 diabetes mellitus with hyperglycemia (HCC); Pre-existing type 2 diabetes mellitus with hyperglycemia during pregnancy in third trimester (HCC); Vaginal discharge during pregnancy; and NST (non-stress test) nonreactive on their problem list.  ----------------------------------------------------------------------------------- Patient reports no complaints.  Has reported some vaginal itching and irritation  Contractions: Irregular. Vag. Bleeding: None.  Movement: Present. Denies leaking of fluid.  ----------------------------------------------------------------------------------- The following portions of the patient's history were reviewed and updated as appropriate: allergies, current medications, past family history, past medical history, past social history, past surgical history and problem list. Problem list updated.   Objective  Blood pressure 118/68, weight (!) 367 lb (166.5 kg), last menstrual period 10/18/2019. Pregravid weight 350 lb (158.8 kg) Total Weight Gain 17 lb (7.711 kg) Urinalysis:      Fetal Status: Fetal Heart Rate (bpm): 140   Movement: Present  Presentation:  Vertex  General:  Alert, oriented and cooperative. Patient is in no acute distress.  Skin: Skin is warm and dry. No rash noted.   Cardiovascular: Normal heart rate noted  Respiratory: Normal respiratory effort, no problems with respiration noted  Abdomen: Soft, gravid, appropriate for gestational age. Pain/Pressure: Present     Pelvic:  Cervical exam deferred      closed on exam  Extremities: Normal range of motion.     ental Status: Normal mood and affect. Normal behavior. Normal judgment and thought content.  US OB Limited  Result Date: 06/20/2020 Patient Name: Yvonne Rios DOB: 10/03/1990 MRN: [id number] ULTRASOUND REPORT Location: [facility] Date of Service: 06/20/2020 Indications:AFI Findings: Mason JimSingleton intrauterine pregnancy is visualized with positive fetal heart tones. Fetal presentation is Cephalic. Placenta: posterior. Grade: 2 AFI: 13.5 cm Impression: 1. 2543w1d Viable Singleton Intrauterine pregnancy dated by previously established criteria. 2. AFI is 13.5 cm. Recommendations: 1.Clinical correlation with the patient's History and Physical Exam. Deanna ArtisElyse S Fairbanks, RT There is a singleton gestation with normal amniotic fluid volume. The fetal biometry correlates with established dating.  The visualized fetal anatomy appears within normal limits within the resolution of ultrasound as described above.  It must be noted that a normal ultrasound is unable to rule out fetal aneuploidy.  Vena AustriaAndreas Wray Goehring, MD, Merlinda FrederickFACOG [facility], [facility] 06/20/2020, 2:52 PM   US OB Limited  Result Date: 06/13/2020 Patient Name: Yvonne Rios DOB: 10/03/1990 MRN: [id number] ULTRASOUND REPORT Location: [facility] Date of Service: 06/13/2020 Indications:AFI Findings: Mason JimSingleton intrauterine pregnancy is visualized with FHR at 132 BPM. Fetal presentation is Mignon PineFrank Breech. Placenta: posterior. Grade: 2 AFI: 14.6 cm Impression: 1. 1643w1d Viable Singleton Intrauterine pregnancy dated by  previously established criteria. 2. AFI is 14.6 cm. Deanna ArtisElyse S Fairbanks, RT The ultrasound images and findings were reviewed by me and I agree with the above report. Thomasene MohairStephen Jackson, MD, Merlinda FrederickFACOG [facility], [facility] 06/13/2020 11:43 AM     US OB Limited  Result Date: 06/04/2020 Patient Name: Yvonne Rios DOB: [birth year] MRN: [id number] ULTRASOUND REPORT Location: [facility] Date of Service: 06/04/2020 Indications:AFI Findings: Mason Jim intrauterine pregnancy is visualized with FHR at 127 BPM. Fetal presentation is Oblique Head Up. Placenta: Posterior Fundal. Grade: 2 AFI: 14.2 cm Impression: 1. [redacted]w[redacted]d Viable Singleton Intrauterine pregnancy dated by previously established criteria. 2. AFI is 14.2 cm. Recommendations: 1.Clinical correlation with the patient's History and Physical Exam. Deanna Artis, RT There is a singleton gestation with normal amniotic fluid volume. The visualized fetal anatomy appears within normal limits within the resolution of ultrasound as described above.  It must be noted that a normal ultrasound is unable to rule out fetal aneuploidy.  Vena Austria, MD, Merl[facility] Frederick [facility], [facility] 06/04/2020, 1:46 PM   US OB Follow Up  Result Date: 05/29/2020 Patient Name: Yvonne Rios DOB: May 04, 1991 MRN: [id number] ULTRASOUND REPORT Location: [facility] Date of Service: 05/29/2020 Indications:growth/afi Findings: Mason Jim intrauterine pregnancy is visualized with FHR at 135 BPM. Biometrics give an (U/S) Gestational age of [redacted]w[redacted]d and an (U/S) EDD of 07/10/2020; this correlates with the clinically established Estimated Date of Delivery: 07/22/20. Fetal presentation is Cephalic. Placenta: posterior. Grade: 2 AFI: 8.2 cm Growth percentile is 70.8%.  AC percentile is 94.8%. EFW: 2310 g  ( 5 lb 1 oz ) Impression: 1. [redacted]w[redacted]d Viable Singleton Intrauterine pregnancy previously established criteria. 2. Growth is 70.8 %ile.  AFI is 8.2 cm.  Recommendations: 1.Clinical correlation with the patient's History and Physical Exam. Deanna Artis, RT There is a singleton gestation with normal amniotic fluid volume. The fetal biometry correlates with established dating, there is AC acceleration present.  The visualized fetal anatomy appears within normal limits within the resolution of ultrasound as described above.  It must be noted that a normal ultrasound is unable to rule out fetal aneuploidy.  Vena Austria, MD, FACOG [facility], [facility] 05/29/2020, 11:00 AM   Baseline: 140 Variability: moderate Accelerations: present Decelerations: absent The patient was monitored for 30 minutes, fetal heart rate tracing was deemed reactive, category I tracing,  CPT [phone]   Assessment   30 y.o. [id number] at [redacted]w[redacted]d by  07/24/2020, by Last Menstrual Period presenting for routine prenatal visit  Plan   FOURTH Problems (from 12/28/19 to present)    Problem Noted Resolved   Supervision of high risk pregnancy, antepartum 12/31/2019 by Vena Austria, MD No   Overview Addendum 06/13/2020 11:18 PM by Natale Milch, MD    [facility]inic [facility] Prenatal Labs  Dating LMP = 6week [place] Blood type: O/Positive/-- (07/23 1457)   Genetic Screen NIPS: normal XY Fragile X neg, CF neg, SMA neg Antibody:Negative (07/23 1457)  Anatomic [place] complte Rubella: 16.40 (07/23 1457)  Varicella: Immune  GTT N/A DMII, baseline HgbA1C 9.4 RPR: Non Reactive (07/23 1457)   Rhogam N/A HBsAg: Negative (07/23 1457)   TDaP vaccine                        Flu Shot: 03/20/2020 HIV: Non Reactive (07/23 1457)   Baby Food                                GBS:   Contraception  Pap: 12/28/2019 NILM  CBB     CS/VBAC N/A   Support Person Husband [name]           Previous Version   Asthma  during pregnancy 12/31/2019 by Vena Austria, MD No   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 by Vena Austria, MD No   Overview Addendum 12/31/2019 10:53 PM by Vena Austria, MD    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 A2/B GDM)  For A2/B GDM or higher classes of DM [X]  Diabetes Education and Testing Supplies [X]  Nutrition Counsult [ ]  Fetal ECHO after 22-24 weeks  [ ]  Eye exam for retina evaluation  [ ]  Baseline EKG [ ]  fetal growth every 4 weeks starting at 28 weeks [ ]  Twice weekly NST starting at [redacted] weeks gestation [ ]  Delivery planning contingent on fetal growth, AFI, glycemic control, and other co-morbidities but at least by 39 weeks  Baseline and surveillance labs (pulled in from [facility], refresh links as needed)  Lab Results  Component Value Date   CREATININE 0.53 (L) 12/28/2019   AST 14 12/28/2019   ALT 14 12/28/2019   TSH 1.390 09/20/2016   Lab Results  Component Value Date   HGBA1C 9.4 (H) 12/28/2019   HGBA1C 6.6 (H) 09/20/2016    Antenatal Testing Class of DM U/S NST/AFI DELIVERY  Diabetes   A1 - good control - O24.410    A2 - good control - O24.419      A2  - poor control or poor compliance - O24.419, E11.65   (Macrosomia or polyhydramnios) **E11.65 is extra code for poor control**    A2/B - O24.919  and B-C O24.319  Poor control B-C or D-R-F-T - O24.319  or  Type I DM - O24.019  20-38  20-38  20-24-28-32-36   20-24-28-32-35-38//fetal echo  20-24-27-30-33-36-38//fetal echo  40  32//2 x wk  32//2 x wk   32//2 x wk  28//BPP wkly then 32//2 x wk  40  39  PRN   39  PRN          Maternal obesity, antepartum 12/28/2019 by 09/22/2016, MD No   First trimester bleeding 12/31/2019 by 09/22/2016, MD 03/26/2020 by 03-08-1971, MD       Gestational age appropriate  obstetric precautions including but not limited to vaginal bleeding, contractions, leaking of fluid and fetal movement were reviewed in detail with the patient.    1) GDM  - reactive NST, normal AFI - BG log reviewed values ranging from 96-102 fasting, with postprandial still varying widely from 103-186 despite exceedingly high basal and mealtime insulin values.   - Given poor control and questionable compliance with dietary recommendations plan is for delivery at 37 weeks.  Mode of delivery will be partially determine by 36 week growth scan results.  She does have MFM consult 1/18 to weigh in on delivery plans - has not had anesthesia consult  2) ADD/anxiety - recommended decreasing weaning Adderal and Klonopin as we approach delivery.  We discussed that given these agents along with her BG control infant will likely require a NICU stay.  3) GBS positive - obtained on L&D, however no sensitivities run and patient has a PCN allergy. Repeat at 36 weeks  4) Vaginal candida - Rx terozole 7  5)  Return in about 4 days (around 06/24/2020) for ROB, NST 06/24/2020 -----  ROB, NST growth scan 06/27/2020.   Return in about 4 days (around 06/24/2020) for ROB, NST 06/24/2020 ----- ROB, NST growth scan 06/27/2020.  Vena Austria, MD, Evern Core [facility], [facility] 06/20/2020, 2:52 PM

## 2020-06-24 ENCOUNTER — Encounter: Payer: Medicaid Other | Admitting: Obstetrics and Gynecology

## 2020-06-24 ENCOUNTER — Other Ambulatory Visit: Payer: Self-pay | Admitting: Maternal & Fetal Medicine

## 2020-06-24 ENCOUNTER — Ambulatory Visit: Payer: Medicaid Other

## 2020-06-24 ENCOUNTER — Ambulatory Visit: Payer: Medicaid Other | Attending: Maternal & Fetal Medicine | Admitting: Maternal & Fetal Medicine

## 2020-06-24 ENCOUNTER — Ambulatory Visit (HOSPITAL_BASED_OUTPATIENT_CLINIC_OR_DEPARTMENT_OTHER): Payer: Medicaid Other

## 2020-06-24 ENCOUNTER — Other Ambulatory Visit: Payer: Self-pay

## 2020-06-24 ENCOUNTER — Other Ambulatory Visit: Payer: Self-pay | Admitting: Obstetrics and Gynecology

## 2020-06-24 DIAGNOSIS — Z3A00 Weeks of gestation of pregnancy not specified: Secondary | ICD-10-CM | POA: Diagnosis not present

## 2020-06-24 DIAGNOSIS — O99210 Obesity complicating pregnancy, unspecified trimester: Secondary | ICD-10-CM

## 2020-06-24 DIAGNOSIS — O3660X0 Maternal care for excessive fetal growth, unspecified trimester, not applicable or unspecified: Secondary | ICD-10-CM | POA: Diagnosis not present

## 2020-06-24 DIAGNOSIS — O24113 Pre-existing diabetes mellitus, type 2, in pregnancy, third trimester: Secondary | ICD-10-CM

## 2020-06-24 DIAGNOSIS — E119 Type 2 diabetes mellitus without complications: Secondary | ICD-10-CM

## 2020-06-24 DIAGNOSIS — E1165 Type 2 diabetes mellitus with hyperglycemia: Secondary | ICD-10-CM

## 2020-06-24 DIAGNOSIS — O24414 Gestational diabetes mellitus in pregnancy, insulin controlled: Secondary | ICD-10-CM

## 2020-06-24 DIAGNOSIS — O0990 Supervision of high risk pregnancy, unspecified, unspecified trimester: Secondary | ICD-10-CM

## 2020-06-24 DIAGNOSIS — Z3A37 37 weeks gestation of pregnancy: Secondary | ICD-10-CM

## 2020-06-24 DIAGNOSIS — Z794 Long term (current) use of insulin: Secondary | ICD-10-CM

## 2020-06-24 DIAGNOSIS — O99519 Diseases of the respiratory system complicating pregnancy, unspecified trimester: Secondary | ICD-10-CM

## 2020-06-24 DIAGNOSIS — O24119 Pre-existing diabetes mellitus, type 2, in pregnancy, unspecified trimester: Secondary | ICD-10-CM | POA: Diagnosis not present

## 2020-06-24 DIAGNOSIS — Q999 Chromosomal abnormality, unspecified: Secondary | ICD-10-CM

## 2020-06-24 DIAGNOSIS — Z3A35 35 weeks gestation of pregnancy: Secondary | ICD-10-CM | POA: Diagnosis not present

## 2020-06-24 DIAGNOSIS — J45909 Unspecified asthma, uncomplicated: Secondary | ICD-10-CM

## 2020-06-24 DIAGNOSIS — O3663X0 Maternal care for excessive fetal growth, third trimester, not applicable or unspecified: Secondary | ICD-10-CM | POA: Diagnosis not present

## 2020-06-24 NOTE — Consult Note (Deleted)
MFM consultation  Reason for request: Timing and mode of delivery given T2DM Date of Service: 06/25/19 Requesting provider: Dr. Vena Austria   Yvonne Rios is a 30 yo G4P1 who is dated by a LMP that was consistent with a first trimester ultrasound.  She is being seen today for growth and counseling about the mode and timing of delivery.  She has known T2 DM on 95u of lantus and Humalog 35 u each meal daily. She reports that her FBS range from 85-115  and 2hr pp from 130-160's.  [name] entered the pregnancy with a HbA1c of 9.4%. In addition, she recently had a NR-NST at 34+ weeks.   Today's ultrasound revealed normal interval growth with measurements large for dates. AC at the 99th%. EFW 90th%. Due to late gestational age suboptimal views of the fetal anatomy were observed. We note prior anatomy was cleared at her providers office. There is normal amniotic fluid and fetal movement. A biophysical profile was performed that was 8/8.  I discussed today's findings with [name]. I particularly discussed prior DM control and testing. While [name] has made good effort she conveyed that it has been difficult and not easily controlled. I reviewed the guidelines for ACOG to include delivery between 37-39 weeks. She is scheduled for 1/29 ([redacted]w[redacted]d). She had a prior 6lb vaginal delivery without complcation and without diabetes. I explained that delivery given her blood sugars and testing at 37 weeks is reasonable. We discussed that a trial of labor is also an option, however, we would recommend avoiding prolonged labor and operative delivery as to avoid a shoulder dystocia. Cesarean delivery should be recommended when weight is > 4500g. We also discussed an elective ceasrean delivery given the [facility] is > 99th%. We discussed the risk and benefits of cesaren delivery in the context of diabetes. We reviewed the increased risk of thrombosis, bleeding, infection, and poor wound healing. At this  time she would prefer a vaginal delivery over cesarean delivery. She will consider the information we discussed and speak further with her providers.   I spent 30 minutes with > 50% in face to face consulation.  All questions anwered

## 2020-06-24 NOTE — Progress Notes (Signed)
MFM consultation  Reason for request: Timing and mode of delivery given T2DM Date of Service: 06/25/19 Requesting provider: Dr. Vena Austria   Yvonne Rios is a 30 yo G4P1 who is dated by a LMP that was consistent with a first trimester ultrasound.  She is being seen today for growth and counseling about the mode and timing of delivery.  She has known T2 DM on 95u of lantus and Humalog 35 u each meal daily. She reports that her FBS range from 85-115  and 2hr pp from 130-160's.  Yvonne Rios entered the pregnancy with a HbA1c of 9.4%. In addition, she recently had a NR-NST at 34+ weeks.   Today's ultrasound revealed normal interval growth with measurements large for dates. AC at the 99th%. EFW 90th%. Due to late gestational age suboptimal views of the fetal anatomy were observed. We note prior anatomy was cleared at her providers office. There is normal amniotic fluid and fetal movement. A biophysical profile was performed that was 8/8.  I discussed today's findings with Yvonne Rios. I particularly discussed prior DM control and testing. While Yvonne Rios has made good effort she conveyed that it has been difficult and not easily controlled. I reviewed the guidelines for ACOG to include delivery between 37-39 weeks. She is scheduled for 1/29 ([redacted]w[redacted]d). She had a prior 6lb vaginal delivery without complcation and without diabetes. I explained that delivery given her blood sugars and testing at 37 weeks is reasonable. We discussed that a trial of labor is also an option, however, we would recommend avoiding prolonged labor and operative delivery as to avoid a shoulder dystocia. Cesarean delivery should be recommended when weight is > 4500g. We also discussed an elective ceasrean delivery given the [facility] is > 99th%. We discussed the risk and benefits of cesaren delivery in the context of diabetes. We reviewed the increased risk of thrombosis, bleeding, infection, and poor wound healing.  At this time she would prefer a vaginal delivery over cesarean delivery. She will consider the information we discussed and speak further with her providers.   I spent 30 minutes with > 50% in face to face consulation.  All questions answered  Novella Olive, MD

## 2020-06-25 ENCOUNTER — Encounter
Admission: RE | Admit: 2020-06-25 | Discharge: 2020-06-25 | Disposition: A | Discharge status: Home / Self Care | Payer: Medicaid Other | Source: Ambulatory Visit | Attending: Anesthesiology | Admitting: Anesthesiology

## 2020-06-25 NOTE — Consult Note (Signed)
Anesthesiology consult note ( Ambulatory referral to OB anesthesia for morbid obesity) :  I had the distinct pleasure of meeting Yvonne [place] today for an OB anesthesia precheck.  She has a history of super morbid obesity with a BMI today of 55.  She is [redacted] weeks pregnant.  Patient reports no previous problems with anesthesia, no problems with her back and has a MP score of 2.  We discussed the risks of Obesity in Pregnancy including but not limited to: Marland Kitchen Patients with a BMI > 40 are at increased risk for complications during pregnancy, such as hypertensive disorders of pregnancy, gestational diabetes, obstructive sleep apnea, thromboembolic disease, prolonged labor, operative vaginal delivery and need for cesarean delivery. . There is an increased risk of airway complications, including inability to place a breathing tube, should an emergency cesarean delivery be required. . There is an increased risk of aspiration, where stomach contents get into the lungs and can cause inflammation, infection and even respiratory failure. Marland Kitchen Epidural placement is more difficult in obesity, often requires multiple attempts, more often results in inadequate pain relief, more often requires replacement due to movement of the epidural catheter and more often results in accidental dural puncture and post dural puncture headache.  I explained to the patient that we would follow our Obesity in Pregnancy Protocol: . An anesthesiology consultation is recommended for all patients with a pre-pregnancy BMI ? 45. o During this consultation the anesthesiologist will ask you questions about your medical history as well as do a physical exam that specifically looks for features that are predictive of complications of anesthesia.  o The anesthesiologist will also review with you potential complications of anesthesia that are specifically increased due to obesity during pregnancy.   Patient voiced understanding and signed our  Obesity in Pregnancy form acknowledging that we had discussed her pathway forward.

## 2020-06-27 ENCOUNTER — Ambulatory Visit (INDEPENDENT_AMBULATORY_CARE_PROVIDER_SITE_OTHER): Payer: Medicaid Other | Admitting: Obstetrics and Gynecology

## 2020-06-27 ENCOUNTER — Other Ambulatory Visit (HOSPITAL_COMMUNITY)
Admission: RE | Admit: 2020-06-27 | Discharge: 2020-06-27 | Disposition: A | Discharge status: Home / Self Care | Payer: Medicaid Other | Source: Ambulatory Visit | Attending: Obstetrics and Gynecology | Admitting: Obstetrics and Gynecology

## 2020-06-27 ENCOUNTER — Encounter: Payer: Self-pay | Admitting: Obstetrics and Gynecology

## 2020-06-27 ENCOUNTER — Other Ambulatory Visit: Payer: Self-pay

## 2020-06-27 ENCOUNTER — Ambulatory Visit: Payer: Medicaid Other

## 2020-06-27 VITALS — BP 132/78 | Wt 369.0 lb

## 2020-06-27 DIAGNOSIS — E1165 Type 2 diabetes mellitus with hyperglycemia: Secondary | ICD-10-CM

## 2020-06-27 DIAGNOSIS — O24119 Pre-existing diabetes mellitus, type 2, in pregnancy, unspecified trimester: Secondary | ICD-10-CM | POA: Diagnosis not present

## 2020-06-27 DIAGNOSIS — O99519 Diseases of the respiratory system complicating pregnancy, unspecified trimester: Secondary | ICD-10-CM

## 2020-06-27 DIAGNOSIS — Z3A36 36 weeks gestation of pregnancy: Secondary | ICD-10-CM | POA: Diagnosis not present

## 2020-06-27 DIAGNOSIS — O0993 Supervision of high risk pregnancy, unspecified, third trimester: Secondary | ICD-10-CM | POA: Insufficient documentation

## 2020-06-27 DIAGNOSIS — J45909 Unspecified asthma, uncomplicated: Secondary | ICD-10-CM

## 2020-06-27 DIAGNOSIS — O0990 Supervision of high risk pregnancy, unspecified, unspecified trimester: Secondary | ICD-10-CM

## 2020-06-27 DIAGNOSIS — O99210 Obesity complicating pregnancy, unspecified trimester: Secondary | ICD-10-CM

## 2020-06-27 NOTE — Progress Notes (Addendum)
Routine Prenatal Care Visit  Subjective  Yvonne Rios is a 30 y.o. [phone] at [redacted]w[redacted]d being seen today for ongoing prenatal care.  She is currently monitored for the following issues for this high-risk pregnancy and has Anxiety; Asthma; Depression; Environmental allergies; Functional constipation; GERD (gastroesophageal reflux disease); Insomnia; Left-sided low back pain with sciatica; Migraine without status migrainosus, not intractable; Mild intermittent asthma without complication; Morbid obesity due to excess calories (HCC); Myopia, left eye; Pre-existing type 2 diabetes mellitus during pregnancy, antepartum; Maternal obesity, antepartum; Supervision of high risk pregnancy, antepartum; Asthma during pregnancy; Chromosome abnormality; Attention deficit disorder (ADD) in adult; Uncontrolled type 2 diabetes mellitus with hyperglycemia (HCC); Type 2 diabetes mellitus affecting pregnancy in third trimester, antepartum; Vaginal discharge during pregnancy; and NST (non-stress test) nonreactive on their problem list.  ----------------------------------------------------------------------------------- Patient reports no complaints.   Contractions: Irregular. Vag. Bleeding: None.  Movement: Present. Leaking Fluid denies.  GDMA2: brings partial BG log. Range is 130's to 222.   ----------------------------------------------------------------------------------- The following portions of the patient's history were reviewed and updated as appropriate: allergies, current medications, past family history, past medical history, past social history, past surgical history and problem list. Problem list updated.  Objective  Blood pressure 132/78, weight (!) 369 lb (167.4 kg), last menstrual period 10/18/2019. Pregravid weight 350 lb (158.8 kg) Total Weight Gain 19 lb (8.618 kg) Urinalysis: Urine Protein    Urine Glucose    Fetal Status: Fetal Heart Rate (bpm): 130   Movement: Present  Presentation:  Vertex  General:  Alert, oriented and cooperative. Patient is in no acute distress.  Skin: Skin is warm and dry. No rash noted.   Cardiovascular: Normal heart rate noted  Respiratory: Normal respiratory effort, no problems with respiration noted  Abdomen: Soft, gravid, appropriate for gestational age. Pain/Pressure: Absent     Pelvic:  Cervical exam performed Dilation: Fingertip Effacement (%): 40 Station: Ballotable  Extremities: Normal range of motion.     Mental Status: Normal mood and affect. Normal behavior. Normal judgment and thought content.   NST: Baseline FHR: 130 beats/min Variability: moderate Accelerations: present Decelerations: absent Tocometry: not done  Interpretation:  INDICATIONS: diabetes mellitus RESULTS:  A NST procedure was performed with FHR monitoring and a normal baseline established, appropriate time of 20-40 minutes of evaluation, and accels >2 seen w 15x15 characteristics.  Results show a REACTIVE NST.    Assessment   30 y.o. [id number] at [redacted]w[redacted]d by  07/24/2020, by Last Menstrual Period presenting for routine prenatal visit  Plan   FOURTH Problems (from 12/28/19 to present)    Problem Noted Resolved   Supervision of high risk pregnancy, antepartum 12/31/2019 by Vena Austria, MD No   Overview Addendum 06/13/2020 11:18 PM by Natale Milch, MD    Clinic [facility] Prenatal Labs  Dating LMP = 6week [place] Blood type: O/Positive/-- (07/23 1457)   Genetic Screen NIPS: normal XY Fragile X neg, CF neg, SMA neg Antibody:Negative (07/23 1457)  Anatomic [place] complte Rubella: 16.40 (07/23 1457)  Varicella: Immune  GTT N/A DMII, baseline HgbA1C 9.4 RPR: Non Reactive (07/23 1457)   Rhogam N/A HBsAg: Negative (07/23 1457)   TDaP vaccine                        Flu Shot: 03/20/2020 HIV: Non Reactive (07/23 1457)   Baby Food  GBS:   Contraception  Pap: 12/28/2019 NILM  CBB     CS/VBAC N/A   Support Person Husband [name]            Previous Version   Asthma during pregnancy 12/31/2019 by Vena Austria, MD No   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 by Vena Austria, MD No   Overview Addendum 12/31/2019 10:53 PM by Vena Austria, MD    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 A2/B GDM)  For A2/B GDM or higher classes of DM [X]  Diabetes Education and Testing Supplies [X]  Nutrition Counsult [ ]  Fetal ECHO after 22-24 weeks  [ ]  Eye exam for retina evaluation  [ ]  Baseline EKG [ ]  fetal growth every 4 weeks starting at 28 weeks [ ]  Twice weekly NST starting at [redacted] weeks gestation [ ]  Delivery planning contingent on fetal growth, AFI, glycemic control, and other co-morbidities but at least by 39 weeks  Baseline and surveillance labs (pulled in from [facility], refresh links as needed)  Lab Results  Component Value Date   CREATININE 0.53 (L) 12/28/2019   AST 14 12/28/2019   ALT 14 12/28/2019   TSH 1.390 09/20/2016   Lab Results  Component Value Date   HGBA1C 9.4 (H) 12/28/2019   HGBA1C 6.6 (H) 09/20/2016    Antenatal Testing Class of DM U/S NST/AFI DELIVERY  Diabetes   A1 - good control - O24.410    A2 - good control - O24.419      A2  - poor control or poor compliance - O24.419, E11.65   (Macrosomia or polyhydramnios) **E11.65 is extra code for poor control**    A2/B - O24.919  and B-C O24.319  Poor control B-C or D-R-F-T - O24.319  or  Type I DM - O24.019  20-38  20-38  20-24-28-32-36   20-24-28-32-35-38//fetal echo  20-24-27-30-33-36-38//fetal echo  40  32//2 x wk  32//2 x wk   32//2 x wk  28//BPP wkly then 32//2 x wk  40  39  PRN   39  PRN          Maternal  obesity, antepartum 12/28/2019 by 09/22/2016, MD No   First trimester bleeding 12/31/2019 by 09/22/2016, MD 03/26/2020 by 03-08-1971, MD       Preterm labor symptoms and general obstetric precautions including but not limited to vaginal bleeding, contractions, leaking of fluid and fetal movement were reviewed in detail with the patient. Please refer to After Visit Summary for other counseling recommendations.   -GBS with clinda sensitivity due to GBS positive at hospital - Aptima today - increase long acting insulin to 105 units, maintain 35/35/35 units for short acting.  Return for Keep previously scheduled appts.   05-28-1983, MD, 09-04-2004 OB/GYN, [facility] 06/27/2020 3:37 PM

## 2020-07-01 ENCOUNTER — Other Ambulatory Visit: Payer: Self-pay

## 2020-07-01 ENCOUNTER — Encounter: Payer: Self-pay | Admitting: Obstetrics and Gynecology

## 2020-07-01 ENCOUNTER — Ambulatory Visit (INDEPENDENT_AMBULATORY_CARE_PROVIDER_SITE_OTHER): Payer: Medicaid Other | Admitting: Obstetrics and Gynecology

## 2020-07-01 VITALS — BP 136/78 | Wt 369.0 lb

## 2020-07-01 DIAGNOSIS — O24113 Pre-existing diabetes mellitus, type 2, in pregnancy, third trimester: Secondary | ICD-10-CM

## 2020-07-01 DIAGNOSIS — J45909 Unspecified asthma, uncomplicated: Secondary | ICD-10-CM

## 2020-07-01 DIAGNOSIS — O99210 Obesity complicating pregnancy, unspecified trimester: Secondary | ICD-10-CM

## 2020-07-01 DIAGNOSIS — O99519 Diseases of the respiratory system complicating pregnancy, unspecified trimester: Secondary | ICD-10-CM

## 2020-07-01 DIAGNOSIS — Z3A36 36 weeks gestation of pregnancy: Secondary | ICD-10-CM

## 2020-07-01 DIAGNOSIS — O0993 Supervision of high risk pregnancy, unspecified, third trimester: Secondary | ICD-10-CM

## 2020-07-01 LAB — CERVICOVAGINAL ANCILLARY ONLY
Chlamydia: NEGATIVE
Comment: NEGATIVE
Comment: NORMAL
Neisseria Gonorrhea: NEGATIVE

## 2020-07-01 NOTE — Progress Notes (Signed)
Routine Prenatal Care Visit  Subjective  Yvonne Rios is a 30 y.o. [phone] at [redacted]w[redacted]d being seen today for ongoing prenatal care.  She is currently monitored for the following issues for this high-risk pregnancy and has Anxiety; Asthma; Depression; Environmental allergies; Functional constipation; GERD (gastroesophageal reflux disease); Insomnia; Left-sided low back pain with sciatica; Migraine without status migrainosus, not intractable; Mild intermittent asthma without complication; Morbid obesity due to excess calories (HCC); Myopia, left eye; Pre-existing type 2 diabetes mellitus during pregnancy, antepartum; Maternal obesity, antepartum; Supervision of high risk pregnancy, antepartum; Asthma during pregnancy; Chromosome abnormality; Attention deficit disorder (ADD) in adult; Uncontrolled type 2 diabetes mellitus with hyperglycemia (HCC); Type 2 diabetes mellitus affecting pregnancy in third trimester, antepartum; Vaginal discharge during pregnancy; and NST (non-stress test) nonreactive on their problem list.  ----------------------------------------------------------------------------------- Patient reports no complaints.   Contractions: Irregular. Vag. Bleeding: None.  Movement: Present. Leaking Fluid denies.  T2DM: brings incomplete log on her phone.  Values still abnormal, though there are occasionally normal ones.  ----------------------------------------------------------------------------------- The following portions of the patient's history were reviewed and updated as appropriate: allergies, current medications, past family history, past medical history, past social history, past surgical history and problem list. Problem list updated.  Objective  Blood pressure 136/78, weight (!) 369 lb (167.4 kg), last menstrual period 10/18/2019. Pregravid weight 350 lb (158.8 kg) Total Weight Gain 19 lb (8.618 kg) Urinalysis: Urine Protein    Urine Glucose    Fetal Status:     Movement:  Present     General:  Alert, oriented and cooperative. Patient is in no acute distress.  Skin: Skin is warm and dry. No rash noted.   Cardiovascular: Normal heart rate noted  Respiratory: Normal respiratory effort, no problems with respiration noted  Abdomen: Soft, gravid, appropriate for gestational age. Pain/Pressure: Present     Pelvic:  Cervical exam deferred        Extremities: Normal range of motion.     Mental Status: Normal mood and affect. Normal behavior. Normal judgment and thought content.   NST: Baseline FHR: 135 beats/min Variability: moderate Accelerations: present Decelerations: absent Tocometry: not done  Interpretation:  INDICATIONS: diabetes mellitus RESULTS:  A NST procedure was performed with FHR monitoring and a normal baseline established, appropriate time of 20-40 minutes of evaluation, and accels >2 seen w 15x15 characteristics.  Results show a REACTIVE NST.    Assessment   30 y.o. [id number] at [redacted]w[redacted]d by  07/24/2020, by Last Menstrual Period presenting for routine prenatal visit  Plan   FOURTH Problems (from 12/28/19 to present)    Problem Noted Resolved   Supervision of high risk pregnancy, antepartum 12/31/2019 by Vena Austria, MD No   Overview Addendum 06/13/2020 11:18 PM by Natale Milch, MD    Clinic [facility] Prenatal Labs  Dating LMP = 6week [place] Blood type: O/Positive/-- (07/23 1457)   Genetic Screen NIPS: normal XY Fragile X neg, CF neg, SMA neg Antibody:Negative (07/23 1457)  Anatomic [place] complte Rubella: 16.40 (07/23 1457)  Varicella: Immune  GTT N/A DMII, baseline HgbA1C 9.4 RPR: Non Reactive (07/23 1457)   Rhogam N/A HBsAg: Negative (07/23 1457)   TDaP vaccine                        Flu Shot: 03/20/2020 HIV: Non Reactive (07/23 1457)   Baby Food  GBS:   Contraception  Pap: 12/28/2019 NILM  CBB     CS/VBAC N/A   Support Person Husband Yvonne Rios           Previous Version   Asthma during pregnancy  12/31/2019 by Vena Austria, MD No   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 by Vena Austria, MD No   Overview Addendum 12/31/2019 10:53 PM by Vena Austria, MD    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 A2/B GDM)  For A2/B GDM or higher classes of DM [X]  Diabetes Education and Testing Supplies [X]  Nutrition Counsult [ ]  Fetal ECHO after 22-24 weeks  [ ]  Eye exam for retina evaluation  [ ]  Baseline EKG [ ]  fetal growth every 4 weeks starting at 28 weeks [ ]  Twice weekly NST starting at [redacted] weeks gestation [ ]  Delivery planning contingent on fetal growth, AFI, glycemic control, and other co-morbidities but at least by 39 weeks  Baseline and surveillance labs (pulled in from [facility], refresh links as needed)  Lab Results  Component Value Date   CREATININE 0.53 (L) 12/28/2019   AST 14 12/28/2019   ALT 14 12/28/2019   TSH 1.390 09/20/2016   Lab Results  Component Value Date   HGBA1C 9.4 (H) 12/28/2019   HGBA1C 6.6 (H) 09/20/2016    Antenatal Testing Class of DM U/S NST/AFI DELIVERY  Diabetes   A1 - good control - O24.410    A2 - good control - O24.419      A2  - poor control or poor compliance - O24.419, E11.65   (Macrosomia or polyhydramnios) **E11.65 is extra code for poor control**    A2/B - O24.919  and B-C O24.319  Poor control B-C or D-R-F-T - O24.319  or  Type I DM - O24.019  20-38  20-38  20-24-28-32-36   20-24-28-32-35-38//fetal echo  20-24-27-30-33-36-38//fetal echo  40  32//2 x wk  32//2 x wk   32//2 x wk  28//BPP wkly then 32//2 x wk  40  39  PRN   39  PRN          Maternal obesity, antepartum 12/28/2019 by 09/22/2016, MD No   First trimester bleeding 12/31/2019 by 09/22/2016, MD 03/26/2020 by 03-08-1971, MD       Preterm labor symptoms and general obstetric precautions including but not limited to vaginal bleeding, contractions, leaking of fluid and fetal movement were reviewed in detail with the patient. Please refer to After Visit Summary for other counseling recommendations.   Insulin changes: Lantus (long acting) - 110 units at night  Short acting (Humulog) Breakfast: 35 units (just  Before) Lunch: 35 units (just before) Supper: 45 units (just before)   Return for Keep previously scheduled appt.   03-05-1989, MD, 05-28-1983 OB/GYN, [facility] 07/02/2020 12:05 PM

## 2020-07-01 NOTE — Patient Instructions (Signed)
Insulin changes: Lantus (long acting) - 110 units at night  Short acting (Humulog) Breakfast: 35 units (just  Before) Lunch: 35 units (just before) Supper: 45 units (just before)

## 2020-07-02 LAB — STREP GP B CULTURE+RFLX: Strep Gp B Culture+Rflx: POSITIVE — AB

## 2020-07-02 LAB — STREP GP B SUSCEPTIBILITY

## 2020-07-04 ENCOUNTER — Other Ambulatory Visit: Payer: Self-pay

## 2020-07-04 ENCOUNTER — Ambulatory Visit (INDEPENDENT_AMBULATORY_CARE_PROVIDER_SITE_OTHER): Payer: Medicaid Other | Admitting: Obstetrics and Gynecology

## 2020-07-04 ENCOUNTER — Encounter: Payer: Medicaid Other | Admitting: Obstetrics and Gynecology

## 2020-07-04 VITALS — BP 128/82 | Wt 370.0 lb

## 2020-07-04 DIAGNOSIS — O0990 Supervision of high risk pregnancy, unspecified, unspecified trimester: Secondary | ICD-10-CM

## 2020-07-04 DIAGNOSIS — O24113 Pre-existing diabetes mellitus, type 2, in pregnancy, third trimester: Secondary | ICD-10-CM

## 2020-07-04 DIAGNOSIS — Z3A37 37 weeks gestation of pregnancy: Secondary | ICD-10-CM

## 2020-07-04 DIAGNOSIS — O24119 Pre-existing diabetes mellitus, type 2, in pregnancy, unspecified trimester: Secondary | ICD-10-CM

## 2020-07-04 DIAGNOSIS — O99210 Obesity complicating pregnancy, unspecified trimester: Secondary | ICD-10-CM

## 2020-07-04 LAB — POCT URINALYSIS DIPSTICK OB: POC,PROTEIN,UA: NEGATIVE

## 2020-07-04 NOTE — Progress Notes (Signed)
Obstetric H&P   Chief Complaint: [place] scheduling  Prenatal Care Provider: [facility]  History of Present Illness: 30 y.o. [id number] 45w1dby 07/24/2020, by Last Menstrual Period presenting to L&D presenting for RFellowsvisit today.  IOL scheduled for tomorrow secondary to poorly controlled DMII with increasing insulin requirement throughout the pregnancy.   Currently on 110U Lantus qHS and 35U Humolog AC breakfast and lunch, 45U dinner.  Last growth scan 06/24/2020 showing EFW of 7lbs 1oz or 3208g c/w 90%ile.  AC 99%ile.  Pelvis tested to 6lbs 14oz with G1 pregnancy.  +FM, no LOF, no VB, irregular contractions  Given need for escalating insulin therapy delivery at 37 weeks with endo-tool.  Fetal echocardiogram obtained secondary to pre-existing diabetes this pregnancy normal.      Pregravid weight 350 lb (158.8 kg) Total Weight Gain 19 lb (8.618 kg)  FOURTH Problems (from 12/28/19 to present)    Problem Noted Resolved   Supervision of high risk pregnancy, antepartum 12/31/2019 by SMalachy Mood MD No   Overview Addendum 06/13/2020 11:18 PM by SHomero Fellers MD    [facility]renatal [facility]  Dating LMP = 6week [place] type: O/Positive/-- (07/23 1457)   Genetic Screen NIPS: normal XY Fragile X neg, CF neg, SMA neg Antibody:Negative (07/23 1457)  Anatomic [place]omplte Rubella: 16.40 (07/23 1457)  Varicella: Immune  GTT N/A DMII, baseline HgbA1C 9.4 RPR: Non Reactive (07/23 1457)   Rhogam N/A HBsAg: Negative (07/23 1457)   TDaP vaccine                        Flu Shot: 03/20/2020 HIV: Non Reactive (07/23 1457)   Baby Food                                GBS:   Contraception  Pap: 12/28/2019 NILM  CBB     CS/VBAC N/A   Support Person Husband [name]           Previous Version   Asthma during pregnancy 12/31/2019 by SMalachy Mood MD No   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 by SMalachy Mood MD No   Overview Addendum 12/31/2019 10:53 PM by SMalachy Mood MD    Maternal -  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 A2/B GDM)  For A2/B GDM or higher classes of DM _1  Diabetes Education and Testing Supplies _2  Nutrition Counsult _3  Fetal ECHO after 22-24 weeks  _4  Eye exam for retina evaluation  _5  Baseline EKG _6  UKoreafetal growth every 4 weeks starting at 28 weeks _7  Twice weekly NST starting at [redacted] weeks gestation _8  Delivery planning contingent on fetal growth, AFI, glycemic control, and other co-morbidities but at least by 39 weeks  [facility] and [facility] labs [facility])  Lab Results  Component Value Date   CREATININE 0.53 (L) 12/28/2019   AST 14 12/28/2019   ALT 14 12/28/2019   TSH 1.390 09/20/2016   Lab Results  Component Value Date   HGBA1C 9.4 (H) 12/28/2019   HGBA1C 6.6 (H) 09/20/2016  Antenatal Testing Class of DM U/S NST/AFI DELIVERY  Diabetes   A1 - good control - O24.410    A2 - good control - O24.419      A2  - poor control or poor compliance - O24.419, E11.65   (Macrosomia or polyhydramnios) **E11.65 is extra code for poor control**    A2/B - O24.919  and B-C O24.319  Poor control B-C or D-R-F-T - O24.319  or  Type I DM - O24.019  20-38  20-38  20-24-28-32-36   20-24-28-32-35-38//fetal echo  20-24-27-30-33-36-38//fetal echo  40  32//2 x wk  32//2 x wk   32//2 x wk  28//BPP wkly then 32//2 x wk  40  39  PRN   39  PRN          Maternal obesity, antepartum 12/28/2019 by Malachy Mood, MD No   First trimester bleeding 12/31/2019 by Malachy Mood, MD 03/26/2020 by Malachy Mood, MD       Review of Systems: 10 point review of systems negative unless  otherwise noted in HPI  Past Medical History: Patient Active Problem List   Diagnosis Date Noted  . NST (non-stress test) nonreactive 06/17/2020  . Vaginal discharge during pregnancy 06/13/2020  . Type 2 diabetes mellitus affecting pregnancy in third trimester, antepartum 05/16/2020  . Uncontrolled type 2 diabetes mellitus with hyperglycemia ([place]) 04/04/2020  . Supervision of high risk pregnancy, antepartum 12/31/2019    [facility]  Dating LMP = 6week [place] Blood type: O/Positive/-- (07/23 1457)   Genetic Screen NIPS: normal XY Fragile X neg, CF neg, SMA neg Antibody:Negative (07/23 1457)  Anatomic [place] complte Rubella: 16.40 (07/23 1457)  Varicella: Immune  GTT N/A DMII, baseline HgbA1C 9.4 RPR: Non Reactive (07/23 1457)   Rhogam N/A HBsAg: Negative (07/23 1457)   TDaP vaccine                        Flu Shot: 03/20/2020 HIV: Non Reactive (07/23 1457)   Baby Food                                GBS:   Contraception  Pap: 12/28/2019 NILM  CBB     CS/VBAC N/A   Support Person Husband [name]        . Asthma during pregnancy 12/31/2019  . 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 Pre-existing type 2 diabetes mellitus during pregnancy, antepartum 12/28/2019    Current Diabetic Medications:  Insulin  _0  Aspirin 81 mg daily after 12 weeks; discontinue after 36 weeks (? A2/B GDM)  For A2/B GDM or higher classes of DM _1  Diabetes Education and Testing Supplies _2  Nutrition Counsult _3  Fetal ECHO after 22-24 weeks  _4  Eye exam for retina evaluation  _5  Baseline EKG _6  US fetal growth every 4 weeks starting at 28 weeks _7  Twice weekly NST starting at [redacted] weeks gestation _8  Delivery planning contingent on fetal growth, AFI, glycemic control, and other co-morbidities but at least by 39 weeks  Baseline and surveillance labs (pulled in from [facility], refresh links as needed)  Lab Results  Component Value Date   CREATININE  0.53 (L) 12/28/2019   AST 14 12/28/2019   ALT 14 12/28/2019   TSH 1.390 09/20/2016   Lab Results  Component Value Date   HGBA1C 9.4 (H) 12/28/2019  HGBA1C 6.6 (H) 09/20/2016    Antenatal Testing Class of DM U/S NST/AFI DELIVERY  Diabetes   A1 - good control - O24.410    A2 - good control - O24.419      A2  - poor control or poor compliance - O24.419, E11.65   (Macrosomia or polyhydramnios) **E11.65 is extra code for poor control**    A2/B - O24.919  and B-C O24.319  Poor control B-C or D-R-F-T - O24.319  or  Type I DM - O24.019  20-38  20-38  20-24-28-32-36   20-24-28-32-35-38//fetal echo  20-24-27-30-33-36-38//fetal echo  40  32//2 x wk  32//2 x wk   32//2 x wk  28//BPP wkly then 32//2 x wk  40  39  PRN   39  PRN       . Maternal obesity, antepartum 12/28/2019  . Attention deficit disorder (ADD) in adult 09/28/2019  . Myopia, left eye 12/07/2017    Last Assessment & Plan:  - Continue without glasses   . Migraine without status migrainosus, not intractable 11/19/2015  . Morbid obesity due to excess calories ([place]) 11/19/2015  . Environmental allergies 03/17/2015  . Asthma 11/21/2014  . Mild intermittent asthma without complication 38/33/3832  . Left-sided low back pain with sciatica 08/09/2014  . Insomnia 11/20/2013  . 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 2016   . GERD (gastroesophageal reflux disease) 07/03/2013  . Anxiety 05/16/2013  . Depression 05/16/2013    Past Surgical History: Past Surgical History:  Procedure Laterality Date  . COLPOSCOPY    . DILATION AND CURETTAGE OF UTERUS  age 89  . HYSTEROSCOPY  2015  . TONSILLECTOMY    . WISDOM TOOTH EXTRACTION      Past Obstetric History: # 1 - Date: None, Sex: None, Weight: None, GA: None, Delivery: None, Apgar1: None, Apgar5: None, Living: None, Birth Comments: None  # 2 - Date: None, Sex: None, Weight: None, GA: None, Delivery:  None, Apgar1: None, Apgar5: None, Living: None, Birth Comments: None  # 3 - Date: 08/14/12, Sex: Female, Weight: 6 lb 14 oz (3.118 kg), GA: None, Delivery: Vaginal, Spontaneous, Apgar1: None, Apgar5: None, Living: Living, Birth Comments: None  # 4 - Date: None, Sex: None, Weight: None, GA: None, Delivery: None, Apgar1: None, Apgar5: None, Living: None, Birth Comments: None   Past Gynecologic History:  Family History: Family History  Problem Relation Age of Onset  . Ovarian cancer Mother 80  . Other Father     Social History: Social History   Socioeconomic History  . Marital status: Married    Spouse name: Dwayne  . Number of children: Not on file  . Years of education: Not on file  . Highest education level: Not on file  Occupational History  . Occupation: [employer]  Tobacco Use  . Smoking status: Former Research scientist (life sciences)  . Smokeless tobacco: Never Used  . Tobacco comment: smoked as teen  Vaping Use  . Vaping Use: Never used  Substance and Sexual Activity  . Alcohol use: Not Currently    Alcohol/week: 0.0 standard drinks    Comment: rarely  . Drug use: No  . Sexual activity: Yes    Partners: Male    Birth control/protection: Other-see comments    Comment: undecided   Other Topics Concern  . Not on file  Social History Narrative  . Not on file   Social Determinants of Health   Financial Resource Strain: Not on file  Food  Insecurity: Not on file  Transportation Needs: Not on file  Physical Activity: Not on file  Stress: Not on file  Social Connections: Not on file  Intimate Partner Violence: Not on file    Medications: Prior to Admission medications   Medication Sig Start Date End Date Taking? Authorizing Provider  Accu-Chek Softclix Lancets lancets 1 each by Other route 4 (four) times daily. Use as instructed 01/07/20   Malachy Mood, MD  ADDERALL XR 20 MG 24 hr capsule Take 1 capsule (20 mg total) by mouth 2 (two) times daily. 06/17/20   Malachy Mood, MD   albuterol (PROVENTIL HFA;VENTOLIN HFA) 108 (90 Base) MCG/ACT inhaler Inhale 2 puffs into the lungs every 4 (four) hours as needed for wheezing or shortness of breath. 05/01/17   Melynda Ripple, MD  ALLERGIST TRAY 1CC 27GX3/8" 27G X 3/8" 1 ML KIT  02/15/20   [provider]  aspirin EC 81 MG tablet Take 1 tablet (81 mg total) by mouth daily. Take after 12 weeks for prevention of preeclampssia later in pregnancy 01/25/20   Malachy Mood, MD  Blood Glucose Monitoring Suppl (ACCU-CHEK AVIVA PLUS) w/Device KIT 1 Units by Does not apply route in the morning, at noon, in the evening, and at bedtime. 01/07/20   Malachy Mood, MD  cetirizine (ZYRTEC) 10 MG tablet Take 1 tablet (10 mg total) by mouth daily. 12/28/19 12/28/20  Malachy Mood, MD  clonazePAM (KLONOPIN) 0.5 MG tablet Take 1 tablet (0.5 mg total) by mouth 2 (two) times daily as needed for anxiety. TAKE 1/2 TABLET BY MOUTH EVERY MORNING, TAKE 1/2 TABLET BY MOUTH EVERY AFTERNOON & TAKE ONE TABLET BY MOUTH AT BEDTIME 06/17/20   Malachy Mood, MD  cyclobenzaprine (FLEXERIL) 10 MG tablet Take 1 tablet (10 mg total) by mouth 3 (three) times daily as needed for muscle spasms. 01/25/20   Malachy Mood, MD  diphenoxylate-atropine (LOMOTIL) 2.5-0.025 MG tablet Take 2 tablets by mouth 4 (four) times daily as needed for diarrhea or loose stools. Patient not taking: Reported on 06/17/2020 06/14/20   Adrian Prows R, MD  EPINEPHrine 0.3 mg/0.3 mL IJ SOAJ injection USE AS DIRECTED PER MD FOR ANAPHYLAXIS Patient not taking: No sig reported 06/09/19   [provider]  famotidine (PEPCID) 20 MG tablet Take 1 tablet (20 mg total) by mouth daily. 12/28/19 12/27/20  Malachy Mood, MD  Fluticasone-Salmeterol (ADVAIR) 250-50 MCG/DOSE AEPB Inhale 1 puff into the lungs daily. 12/28/19   Malachy Mood, MD  folic acid (FOLVITE) 1 MG tablet Take 1 tablet (1 mg total) by mouth daily. 01/25/20   Malachy Mood, MD  glucose blood test  strip Use as instructed 01/07/20   Malachy Mood, MD  insulin glargine (LANTUS) 100 UNIT/ML Solostar Pen Inject 95 Units into the skin at bedtime. 06/13/20   Will Bonnet, MD  Insulin Pen Needle (NOVOFINE) 32G X 6 MM MISC 1 Units by Does not apply route in the morning, at noon, in the evening, and at bedtime. 01/07/20   Malachy Mood, MD  insulin regular human CONCENTRATED (HUMULIN R U-500 KWIKPEN) 500 UNIT/ML kwikpen Inject 35 Units into the skin 3 (three) times daily before meals. 1/79/15   Copland, Elmo Putt B, PA-C  montelukast (SINGULAIR) 10 MG tablet Take 1 tablet (10 mg total) by mouth at bedtime. 12/28/19 12/27/20  Malachy Mood, MD  pantoprazole (PROTONIX) 40 MG tablet Take 1 tablet (40 mg total) by mouth daily. 12/28/19 12/27/20  Malachy Mood, MD  sertraline (ZOLOFT) 50 MG tablet Take 1 tablet (  50 mg total) by mouth daily. 12/28/19   Malachy Mood, MD  Spacer/Aero-Holding Chambers (AEROCHAMBER PLUS) inhaler Use as instructed 05/01/17   Melynda Ripple, MD  SYMBICORT 160-4.5 MCG/ACT inhaler Inhale 2 puffs into the lungs in the morning and at bedtime. 12/05/19   [provider]  terconazole (TERAZOL 7) 0.4 % vaginal cream Place 1 applicator vaginally at bedtime. 06/20/20   Malachy Mood, MD    Allergies: Allergies  Allergen Reactions  . Cefprozil Other (See Comments)  . Amoxil [Amoxicillin] Rash  . Augmentin [Amoxicillin-Pot Clavulanate] Rash    Physical Exam: Vitals: Blood pressure 128/82, weight (!) 370 lb (167.8 kg), last menstrual period 10/18/2019.  FHT: 150  General: NAD HEENT: normocephalic, anicteric Pulmonary: No increased work of breathing Cardiovascular: RRR, distal pulses 2+ Abdomen: Gravid, non-tender Le[place]olds: vertex Genitourinary: closed,, narrow pubic arch noted Extremities: no edema, erythema, or tenderness Neurologic: Grossly intact Psychiatric: mood appropriate, affect full  Labs: No results found for this or any previous visit  (from the past 24 hour(s)).  Assessment: 30 y.o. [id number] 73w1dby 07/24/2020, by Last Menstrual Period presenting for ROB and IOL scheduling secondary to uncontrolled DM II  Plan: 1) Anesthesia consult completed 06/25/2020.  Will proceed with cytotec induction of labor   2) DM II - endotool ordered  3) PNL - Blood type O/Positive/-- (07/23 1457) / Anti-bodyscreen Negative (07/23 1457) / Rubella 16.40 (07/23 1457) / Varicella Immune / RPR Non Reactive (11/12 1543) / HBsAg Negative (07/23 1457) / HIV Non Reactive (11/12 1543) / 1-hr OGTT N/A / GBS Positive/-- (01/21 1612) - GBS ppx clindamycin resistant given reported PCN and cephalosporin allergy vancomycin  4) Immunization History -  Immunization History  Administered Date(s) Administered  . Influenza Inj Mdck Quad Pf 04/06/2019  . Influenza,inj,Quad PF,6+ Mos 06/26/2014, 03/19/2015, 03/11/2016, 03/16/2017, 02/20/2018  . Influenza,inj,quad, With Preservative 03/20/2020  . Influenza-Unspecified 04/09/2019  . PPD Test 04/09/2016  . Pneumococcal Polysaccharide-23 01/09/2013  . Tdap 01/20/2018  . Varicella 01/20/2018, 07/19/2018    5) Disposition -   AMalachy Mood MD, [facility] 07/04/2020, 2:21 PM

## 2020-07-05 ENCOUNTER — Inpatient Hospital Stay
Admission: RE | Admit: 2020-07-05 | Discharge: 2020-07-08 | DRG: 807 | Disposition: A | Discharge status: Home / Self Care | Payer: Medicaid Other | Attending: Obstetrics and Gynecology | Admitting: Obstetrics and Gynecology

## 2020-07-05 ENCOUNTER — Encounter: Payer: Self-pay | Admitting: Obstetrics and Gynecology

## 2020-07-05 ENCOUNTER — Inpatient Hospital Stay: Payer: Medicaid Other | Admitting: Anesthesiology

## 2020-07-05 ENCOUNTER — Other Ambulatory Visit: Payer: Self-pay

## 2020-07-05 DIAGNOSIS — O26893 Other specified pregnancy related conditions, third trimester: Secondary | ICD-10-CM | POA: Diagnosis present

## 2020-07-05 DIAGNOSIS — O99513 Diseases of the respiratory system complicating pregnancy, third trimester: Secondary | ICD-10-CM | POA: Diagnosis present

## 2020-07-05 DIAGNOSIS — Q999 Chromosomal abnormality, unspecified: Secondary | ICD-10-CM

## 2020-07-05 DIAGNOSIS — Z20822 Contact with and (suspected) exposure to covid-19: Secondary | ICD-10-CM | POA: Diagnosis not present

## 2020-07-05 DIAGNOSIS — Z7984 Long term (current) use of oral hypoglycemic drugs: Secondary | ICD-10-CM | POA: Diagnosis not present

## 2020-07-05 DIAGNOSIS — O99210 Obesity complicating pregnancy, unspecified trimester: Secondary | ICD-10-CM

## 2020-07-05 DIAGNOSIS — O99519 Diseases of the respiratory system complicating pregnancy, unspecified trimester: Secondary | ICD-10-CM

## 2020-07-05 DIAGNOSIS — O0990 Supervision of high risk pregnancy, unspecified, unspecified trimester: Secondary | ICD-10-CM

## 2020-07-05 DIAGNOSIS — J4520 Mild intermittent asthma, uncomplicated: Secondary | ICD-10-CM | POA: Diagnosis present

## 2020-07-05 DIAGNOSIS — J45909 Unspecified asthma, uncomplicated: Secondary | ICD-10-CM

## 2020-07-05 DIAGNOSIS — E1165 Type 2 diabetes mellitus with hyperglycemia: Secondary | ICD-10-CM | POA: Diagnosis present

## 2020-07-05 DIAGNOSIS — Z3A37 37 weeks gestation of pregnancy: Secondary | ICD-10-CM | POA: Diagnosis not present

## 2020-07-05 DIAGNOSIS — O99214 Obesity complicating childbirth: Secondary | ICD-10-CM | POA: Diagnosis not present

## 2020-07-05 DIAGNOSIS — O2412 Pre-existing diabetes mellitus, type 2, in childbirth: Principal | ICD-10-CM | POA: Diagnosis present

## 2020-07-05 DIAGNOSIS — Z794 Long term (current) use of insulin: Secondary | ICD-10-CM | POA: Diagnosis not present

## 2020-07-05 DIAGNOSIS — Z419 Encounter for procedure for purposes other than remedying health state, unspecified: Secondary | ICD-10-CM | POA: Diagnosis not present

## 2020-07-05 DIAGNOSIS — O24119 Pre-existing diabetes mellitus, type 2, in pregnancy, unspecified trimester: Secondary | ICD-10-CM

## 2020-07-05 LAB — BASIC METABOLIC PANEL
Anion gap: 11 (ref 5–15)
BUN: 8 mg/dL (ref 6–20)
CO2: 22 mmol/L (ref 22–32)
Calcium: 8.7 mg/dL — ABNORMAL LOW (ref 8.9–10.3)
Chloride: 104 mmol/L (ref 98–111)
Creatinine, Ser: 0.61 mg/dL (ref 0.44–1.00)
GFR, Estimated: >60 mL/min (ref >60)
Glucose, Bld: 140 mg/dL — ABNORMAL HIGH (ref 70–99)
Potassium: 3.8 mmol/L (ref 3.5–5.1)
Sodium: 137 mmol/L (ref 135–145)

## 2020-07-05 LAB — CBC
HCT: 32.9 % — ABNORMAL LOW (ref 36.0–46.0)
Hemoglobin: 10.6 g/dL — ABNORMAL LOW (ref 12.0–15.0)
MCH: 25.9 pg — ABNORMAL LOW (ref 26.0–34.0)
MCHC: 32.2 g/dL (ref 30.0–36.0)
MCV: 80.4 fL (ref 80.0–100.0)
Platelets: 257 10*3/uL (ref 150–400)
RBC: 4.09 MIL/uL (ref 3.87–5.11)
RDW: 16.2 % — ABNORMAL HIGH (ref 11.5–15.5)
WBC: 12.3 10*3/uL — ABNORMAL HIGH (ref 4.0–10.5)
nRBC: 0 % (ref 0.0–0.2)

## 2020-07-05 LAB — TYPE AND SCREEN
ABO/RH(D): O POS
Antibody Screen: NEGATIVE

## 2020-07-05 LAB — GLUCOSE, CAPILLARY
Glucose-Capillary: 159 mg/dL — ABNORMAL HIGH (ref 70–99)
Glucose-Capillary: 130 mg/dL — ABNORMAL HIGH (ref 70–99)
Glucose-Capillary: 131 mg/dL — ABNORMAL HIGH (ref 70–99)
Glucose-Capillary: 107 mg/dL — ABNORMAL HIGH (ref 70–99)
Glucose-Capillary: 92 mg/dL (ref 70–99)
Glucose-Capillary: 79 mg/dL (ref 70–99)
Glucose-Capillary: 80 mg/dL (ref 70–99)
Glucose-Capillary: 96 mg/dL (ref 70–99)
Glucose-Capillary: 89 mg/dL (ref 70–99)
Glucose-Capillary: 109 mg/dL — ABNORMAL HIGH (ref 70–99)
Glucose-Capillary: 166 mg/dL — ABNORMAL HIGH (ref 70–99)
Glucose-Capillary: 128 mg/dL — ABNORMAL HIGH (ref 70–99)
Glucose-Capillary: 133 mg/dL — ABNORMAL HIGH (ref 70–99)

## 2020-07-05 LAB — SARS CORONAVIRUS 2 BY RT PCR (HOSPITAL ORDER, PERFORMED IN ~~LOC~~ HOSPITAL LAB): SARS Coronavirus 2: NEGATIVE

## 2020-07-05 LAB — ABO/RH: ABO/RH(D): O POS

## 2020-07-05 MED ORDER — LACTATED RINGERS IV SOLN: INTRAVENOUS | Status: DC

## 2020-07-05 MED ORDER — MISOPROSTOL 25 MCG QUARTER TABLET
25.0000 ug | ORAL_TABLET | Freq: Once | ORAL | Status: AC
  Administered 2020-07-05: 25 ug via BUCCAL
  Filled 2020-07-05: qty 1

## 2020-07-05 MED ORDER — SODIUM CHLORIDE 0.9 % IV SOLN
INTRAVENOUS | Status: DC | PRN
  Administered 2020-07-05 (×2): 5 mL via EPIDURAL

## 2020-07-05 MED ORDER — LIDOCAINE HCL (PF) 1 % IJ SOLN: 30.0000 mL | INTRAMUSCULAR | Status: DC | PRN

## 2020-07-05 MED ORDER — SOD CITRATE-CITRIC ACID 500-334 MG/5ML PO SOLN: 30.0000 mL | ORAL | Status: DC | PRN

## 2020-07-05 MED ORDER — FENTANYL 2.5 MCG/ML W/ROPIVACAINE 0.15% IN NS 100 ML EPIDURAL (ARMC)
12.0000 mL/h | EPIDURAL | Status: DC
  Administered 2020-07-05: 12 mL/h via EPIDURAL
  Filled 2020-07-05: qty 100

## 2020-07-05 MED ORDER — SODIUM CHLORIDE (PF) 0.9 % IJ SOLN
INTRAMUSCULAR | Status: AC
  Filled 2020-07-05: qty 50

## 2020-07-05 MED ORDER — OXYTOCIN BOLUS FROM INFUSION
333.0000 mL | Freq: Once | INTRAVENOUS | Status: AC
  Administered 2020-07-06: 333 mL via INTRAVENOUS

## 2020-07-05 MED ORDER — VANCOMYCIN HCL IN DEXTROSE 1-5 GM/200ML-% IV SOLN
1000.0000 mg | Freq: Two times a day (BID) | INTRAVENOUS | Status: DC
  Administered 2020-07-05 (×2): 1000 mg via INTRAVENOUS
  Filled 2020-07-05 (×4): qty 200

## 2020-07-05 MED ORDER — LIDOCAINE HCL (PF) 1 % IJ SOLN
INTRAMUSCULAR | Status: AC
  Filled 2020-07-05: qty 30

## 2020-07-05 MED ORDER — ONDANSETRON HCL 4 MG/2ML IJ SOLN
4.0000 mg | Freq: Four times a day (QID) | INTRAMUSCULAR | Status: DC | PRN
  Administered 2020-07-05: 4 mg via INTRAVENOUS
  Filled 2020-07-05: qty 2

## 2020-07-05 MED ORDER — ACETAMINOPHEN 325 MG PO TABS
650.0000 mg | ORAL_TABLET | ORAL | Status: DC | PRN
  Administered 2020-07-05: 650 mg via ORAL
  Filled 2020-07-05: qty 2

## 2020-07-05 MED ORDER — PHENYLEPHRINE 40 MCG/ML (10ML) SYRINGE FOR IV PUSH (FOR BLOOD PRESSURE SUPPORT): 80.0000 ug | PREFILLED_SYRINGE | INTRAVENOUS | Status: DC | PRN

## 2020-07-05 MED ORDER — LIDOCAINE HCL (PF) 1 % IJ SOLN
INTRAMUSCULAR | Status: DC | PRN
  Administered 2020-07-05: 1 mL via SUBCUTANEOUS

## 2020-07-05 MED ORDER — BUTORPHANOL TARTRATE 1 MG/ML IJ SOLN
1.0000 mg | INTRAMUSCULAR | Status: DC | PRN
Start: 2020-07-05 — End: 2020-07-06
  Administered 2020-07-05: 1 mg via INTRAVENOUS
  Filled 2020-07-05: qty 1

## 2020-07-05 MED ORDER — TERBUTALINE SULFATE 1 MG/ML IJ SOLN: 0.2500 mg | Freq: Once | INTRAMUSCULAR | Status: DC | PRN

## 2020-07-05 MED ORDER — EPHEDRINE 5 MG/ML INJ: 10.0000 mg | INTRAVENOUS | Status: DC | PRN

## 2020-07-05 MED ORDER — AMMONIA AROMATIC IN INHA
RESPIRATORY_TRACT | Status: AC
  Filled 2020-07-05: qty 10

## 2020-07-05 MED ORDER — INSULIN REGULAR(HUMAN) IN NACL 100-0.9 UT/100ML-% IV SOLN
INTRAVENOUS | Status: DC
  Administered 2020-07-05: 3.2 [IU]/h via INTRAVENOUS
  Filled 2020-07-05 (×2): qty 100

## 2020-07-05 MED ORDER — DIPHENHYDRAMINE HCL 50 MG/ML IJ SOLN: 12.5000 mg | INTRAMUSCULAR | Status: DC | PRN

## 2020-07-05 MED ORDER — DEXTROSE 50 % IV SOLN: 0.0000 mL | INTRAVENOUS | Status: DC | PRN

## 2020-07-05 MED ORDER — LACTATED RINGERS IV SOLN: 500.0000 mL | Freq: Once | INTRAVENOUS | Status: DC

## 2020-07-05 MED ORDER — MISOPROSTOL 25 MCG QUARTER TABLET
25.0000 ug | ORAL_TABLET | ORAL | Status: DC | PRN
  Administered 2020-07-05: 25 ug via VAGINAL
  Filled 2020-07-05 (×2): qty 1

## 2020-07-05 MED ORDER — DEXTROSE IN LACTATED RINGERS 5 % IV SOLN
INTRAVENOUS | Status: DC
  Administered 2020-07-05: 500 mL via INTRAVENOUS

## 2020-07-05 MED ORDER — LACTATED RINGERS IV SOLN: 500.0000 mL | INTRAVENOUS | Status: DC | PRN

## 2020-07-05 MED ORDER — OXYTOCIN-SODIUM CHLORIDE 30-0.9 UT/500ML-% IV SOLN
1.0000 m[IU]/min | INTRAVENOUS | Status: DC
  Administered 2020-07-05: 2 m[IU]/min via INTRAVENOUS
  Filled 2020-07-05: qty 500

## 2020-07-05 MED ORDER — LIDOCAINE-EPINEPHRINE (PF) 1.5 %-1:200000 IJ SOLN
INTRAMUSCULAR | Status: DC | PRN
  Administered 2020-07-05: 3 mL via EPIDURAL

## 2020-07-05 MED ORDER — FENTANYL 2.5 MCG/ML W/ROPIVACAINE 0.15% IN NS 100 ML EPIDURAL (ARMC)
EPIDURAL | Status: AC
  Filled 2020-07-05: qty 100

## 2020-07-05 MED ORDER — OXYTOCIN 10 UNIT/ML IJ SOLN
INTRAMUSCULAR | Status: AC
  Filled 2020-07-05: qty 2

## 2020-07-05 MED ORDER — MISOPROSTOL 200 MCG PO TABS
ORAL_TABLET | ORAL | Status: AC
  Administered 2020-07-05: 25 ug via VAGINAL
  Filled 2020-07-05: qty 4

## 2020-07-05 MED ORDER — OXYTOCIN-SODIUM CHLORIDE 30-0.9 UT/500ML-% IV SOLN
2.5000 [IU]/h | INTRAVENOUS | Status: DC
  Administered 2020-07-06: 2.5 [IU]/h via INTRAVENOUS
  Filled 2020-07-05: qty 1000

## 2020-07-05 NOTE — Progress Notes (Signed)
Subjective:  Comfortable, epidural in place  Objective:   Vitals: Blood pressure 124/64, pulse 77, temperature 98.5 F (36.9 C), temperature source Oral, resp. rate 20, height 5\' 8"  (1.727 m), weight (!) 167.8 kg, last menstrual period 10/18/2019, SpO2 97 %. General: NAD Abdomen:gravid, non-tender Cervical Exam:  Dilation: 3.5 Effacement (%): 50 Cervical Position: Posterior Station: -3 Presentation: Vertex Exam by:: [email or web address], MD  FHT: 130, moderate, +accels, no decels  Toco: not picking up well IUPC now placed to aid in pitocin ditration  Results for orders placed or performed during the hospital encounter of 07/05/20 (from the past 24 hour(s))  SARS Coronavirus 2 by RT PCR (hospital order, performed in [facility] hospital lab) Nasopharyngeal Nasopharyngeal Swab     Status: None   Collection Time: 07/05/20  8:41 AM   Specimen: Nasopharyngeal Swab  Result Value Ref Range   SARS Coronavirus 2 NEGATIVE NEGATIVE  Basic metabolic panel     Status: Abnormal   Collection Time: 07/05/20  8:41 AM  Result Value Ref Range   Sodium 137 135 - 145 mmol/L   Potassium 3.8 3.5 - 5.1 mmol/L   Chloride 104 98 - 111 mmol/L   CO2 22 22 - 32 mmol/L   Glucose, Bld 140 (H) 70 - 99 mg/dL   BUN 8 6 - 20 mg/dL   Creatinine, Ser 07/07/20 0.44 - 1.00 mg/dL   Calcium 8.7 (L) 8.9 - 10.3 mg/dL   GFR, Estimated 6.21 >30 mL/min   Anion gap 11 5 - 15  Glucose, capillary     Status: Abnormal   Collection Time: 07/05/20  9:11 AM  Result Value Ref Range   Glucose-Capillary 159 (H) 70 - 99 mg/dL  CBC     Status: Abnormal   Collection Time: 07/05/20  9:55 AM  Result Value Ref Range   WBC 12.3 (H) 4.0 - 10.5 K/uL   RBC 4.09 3.87 - 5.11 MIL/uL   Hemoglobin 10.6 (L) 12.0 - 15.0 g/dL   HCT 07/07/20 (L) 57.8 - 46.9 %   MCV 80.4 80.0 - 100.0 fL   MCH 25.9 (L) 26.0 - 34.0 pg   MCHC 32.2 30.0 - 36.0 g/dL   RDW 62.9 (H) 52.8 - 41.3 %   Platelets 257 150 - 400 K/uL   nRBC 0.0 0.0 - 0.2 %  Type and screen      Status: None   Collection Time: 07/05/20 10:07 AM  Result Value Ref Range   ABO/RH(D) O POS    Antibody Screen NEG    Sample Expiration      07/08/2020,2359 Performed at [facility], [address] [place], [place]   ABO/Rh     Status: None   Collection Time: 07/05/20 10:50 AM  Result Value Ref Range   ABO/RH(D)      O POS Performed at [facility], [address] [place], [place]   Glucose, capillary     Status: Abnormal   Collection Time: 07/05/20 12:07 PM  Result Value Ref Range   Glucose-Capillary 130 (H) 70 - 99 mg/dL  Glucose, capillary     Status: Abnormal   Collection Time: 07/05/20 12:38 PM  Result Value Ref Range   Glucose-Capillary 131 (H) 70 - 99 mg/dL  Glucose, capillary     Status: Abnormal   Collection Time: 07/05/20  1:38 PM  Result Value Ref Range   Glucose-Capillary 107 (H) 70 - 99 mg/dL  Glucose, capillary     Status: None  Collection Time: 07/05/20  2:39 PM  Result Value Ref Range   Glucose-Capillary 92 70 - 99 mg/dL  Glucose, capillary     Status: None   Collection Time: 07/05/20  3:39 PM  Result Value Ref Range   Glucose-Capillary 79 70 - 99 mg/dL  Glucose, capillary     Status: None   Collection Time: 07/05/20  4:49 PM  Result Value Ref Range   Glucose-Capillary 80 70 - 99 mg/dL  Glucose, capillary     Status: None   Collection Time: 07/05/20  5:37 PM  Result Value Ref Range   Glucose-Capillary 96 70 - 99 mg/dL  Glucose, capillary     Status: None   Collection Time: 07/05/20  6:45 PM  Result Value Ref Range   Glucose-Capillary 89 70 - 99 mg/dL  Glucose, capillary     Status: Abnormal   Collection Time: 07/05/20  7:44 PM  Result Value Ref Range   Glucose-Capillary 109 (H) 70 - 99 mg/dL  Glucose, capillary     Status: Abnormal   Collection Time: 07/05/20  9:23 PM  Result Value Ref Range   Glucose-Capillary 166 (H) 70 - 99 mg/dL    Assessment:   30 y.o. [id number] [redacted]w[redacted]d IOL uncontrolled DM  II  Plan:   1) Labor - AROM clear, IUPC placed continue to titrate pitocin  2) Fetus - cat I tracing - continue vancomycin for GBS ppx  3) GDM continue endo tool  Vena Austria, MD, [facility], [facility] 07/05/2020, 10:14 PM

## 2020-07-05 NOTE — Progress Notes (Signed)
Subjective:  Feeling more painful contractions  Objective:   Vitals: Blood pressure 135/61, pulse 73, temperature 98.5 F (36.9 C), temperature source Oral, resp. rate 20, height 5\' 8"  (1.727 m), weight (!) 167.8 kg, last menstrual period 10/18/2019. General: NAD Abdomen: gravid, non-tnder Cervical Exam:  Dilation: 1.5 Effacement (%): 50 Cervical Position: Posterior Station: -3 Presentation: Vertex Exam by:: LSE/LM  FHT: 125, moderate, +accels, no decels Toco: q2-48min not picking up well  Results for orders placed or performed during the hospital encounter of 07/05/20 (from the past 24 hour(s))  SARS Coronavirus 2 by RT PCR (hospital order, performed in [facility] hospital lab) Nasopharyngeal Nasopharyngeal Swab     Status: None   Collection Time: 07/05/20  8:41 AM   Specimen: Nasopharyngeal Swab  Result Value Ref Range   SARS Coronavirus 2 NEGATIVE NEGATIVE  Basic metabolic panel     Status: Abnormal   Collection Time: 07/05/20  8:41 AM  Result Value Ref Range   Sodium 137 135 - 145 mmol/L   Potassium 3.8 3.5 - 5.1 mmol/L   Chloride 104 98 - 111 mmol/L   CO2 22 22 - 32 mmol/L   Glucose, Bld 140 (H) 70 - 99 mg/dL   BUN 8 6 - 20 mg/dL   Creatinine, Ser 07/07/20 0.44 - 1.00 mg/dL   Calcium 8.7 (L) 8.9 - 10.3 mg/dL   GFR, Estimated 1.88 >41 mL/min   Anion gap 11 5 - 15  Glucose, capillary     Status: Abnormal   Collection Time: 07/05/20  9:11 AM  Result Value Ref Range   Glucose-Capillary 159 (H) 70 - 99 mg/dL  CBC     Status: Abnormal   Collection Time: 07/05/20  9:55 AM  Result Value Ref Range   WBC 12.3 (H) 4.0 - 10.5 K/uL   RBC 4.09 3.87 - 5.11 MIL/uL   Hemoglobin 10.6 (L) 12.0 - 15.0 g/dL   HCT 07/07/20 (L) 06.3 - 01.6 %   MCV 80.4 80.0 - 100.0 fL   MCH 25.9 (L) 26.0 - 34.0 pg   MCHC 32.2 30.0 - 36.0 g/dL   RDW 01.0 (H) 93.2 - 35.5 %   Platelets 257 150 - 400 K/uL   nRBC 0.0 0.0 - 0.2 %  Type and screen     Status: None   Collection Time: 07/05/20 10:07 AM   Result Value Ref Range   ABO/RH(D) O POS    Antibody Screen NEG    Sample Expiration      07/08/2020,2359 Performed at [facility], [address] [place], [place]   ABO/Rh     Status: None   Collection Time: 07/05/20 10:50 AM  Result Value Ref Range   ABO/RH(D)      O POS Performed at [facility], [address] [place], [place]   Glucose, capillary     Status: Abnormal   Collection Time: 07/05/20 12:07 PM  Result Value Ref Range   Glucose-Capillary 130 (H) 70 - 99 mg/dL  Glucose, capillary     Status: Abnormal   Collection Time: 07/05/20 12:38 PM  Result Value Ref Range   Glucose-Capillary 131 (H) 70 - 99 mg/dL  Glucose, capillary     Status: Abnormal   Collection Time: 07/05/20  1:38 PM  Result Value Ref Range   Glucose-Capillary 107 (H) 70 - 99 mg/dL  Glucose, capillary     Status: None   Collection Time: 07/05/20  2:39 PM  Result Value Ref  Range   Glucose-Capillary 92 70 - 99 mg/dL  Glucose, capillary     Status: None   Collection Time: 07/05/20  3:39 PM  Result Value Ref Range   Glucose-Capillary 79 70 - 99 mg/dL  Glucose, capillary     Status: None   Collection Time: 07/05/20  4:49 PM  Result Value Ref Range   Glucose-Capillary 80 70 - 99 mg/dL  Glucose, capillary     Status: None   Collection Time: 07/05/20  5:37 PM  Result Value Ref Range   Glucose-Capillary 96 70 - 99 mg/dL    Assessment:   30 y.o. [id number] [redacted]w[redacted]d IOL uncontrolled DM II  Plan:   1) Labor - switch to pitocin at 18:45.  Still high station not amenable to AROM attempt at foley insertion unsucessful.  Plan IUPC once able to rupture  2) Fetus - cat I tracing - vancomycin for GBS ppx  3) DM II continue endo tool   Vena Austria, MD, [facility], [facility] 07/05/2020, 6:07 PM

## 2020-07-05 NOTE — Anesthesia Preprocedure Evaluation (Signed)
Anesthesia Evaluation  Patient identified by MRN, date of birth, ID band Patient awake    Reviewed: Allergy & Precautions, H&P , NPO status , Patient's Chart, lab work & pertinent test results  History of Anesthesia Complications Negative for: history of anesthetic complications  Airway Mallampati: II  TM Distance: >3 FB Neck ROM: full    Dental  (+) Chipped   Pulmonary neg shortness of breath, asthma , former smoker,    Pulmonary exam normal        Cardiovascular Exercise Tolerance: Good (-) hypertensionnegative cardio ROS Normal cardiovascular exam     Neuro/Psych  Headaches, PSYCHIATRIC DISORDERS  Neuromuscular disease    GI/Hepatic GERD  Medicated and Controlled,  Endo/Other  diabetes, Insulin DependentMorbid obesity  Renal/GU   negative genitourinary   Musculoskeletal   Abdominal   Peds  Hematology negative hematology ROS (+)   Anesthesia Other Findings Past Medical History: No date: ADD (attention deficit disorder) No date: Anxiety No date: Asthma No date: Depression No date: Diabetes mellitus without complication (HCC) No date: GERD (gastroesophageal reflux disease) No date: IBS (irritable bowel syndrome) No date: Migraine No date: MRSA (methicillin resistant staph aureus) culture positive No date: Oligomenorrhea No date: Pyloric stenosis No date: UTI (urinary tract infection)  Past Surgical History: No date: COLPOSCOPY age 16: DILATION AND CURETTAGE OF UTERUS 2015: HYSTEROSCOPY No date: TONSILLECTOMY No date: WISDOM TOOTH EXTRACTION  BMI    Body Mass Index: 56.26 kg/m      Reproductive/Obstetrics (+) Pregnancy                             Anesthesia Physical Anesthesia Plan  ASA: III  Anesthesia Plan: Epidural   Post-op Pain Management:    Induction:   PONV Risk Score and Plan:   Airway Management Planned: Natural Airway  Additional Equipment:    Intra-op Plan:   Post-operative Plan:   Informed Consent: I have reviewed the patients History and Physical, chart, labs and discussed the procedure including the risks, benefits and alternatives for the proposed anesthesia with the patient or authorized representative who has indicated his/her understanding and acceptance.     Dental Advisory Given  Plan Discussed with: Anesthesiologist  Anesthesia Plan Comments: (Patient reports no bleeding problems and no anticoagulant use.   Patient consented for risks of anesthesia including but not limited to:  - adverse reactions to medications - risk of bleeding, infection and or nerve damage from epidural that could lead to paralysis - risk of headache or failed epidural - nerve damage due to positioning - that if epidural is used for C-section that there is a chance of epidural failure requiring spinal placement or conversion to GA - Damage to heart, brain, lungs, other parts of body or loss of life  Patient voiced understanding.)        Anesthesia Quick Evaluation

## 2020-07-05 NOTE — Anesthesia Procedure Notes (Signed)
Epidural Patient location during procedure: OB Start time: 07/05/2020 8:48 PM End time: 07/05/2020 8:55 PM  Staffing Anesthesiologist: Daleyssa Loiselle, Cleda Mccreedy, MD Performed: anesthesiologist   Preanesthetic Checklist Completed: patient identified, IV checked, site marked, risks and benefits discussed, surgical consent, monitors and equipment checked, pre-op evaluation and timeout performed  Epidural Patient position: sitting Prep: ChloraPrep Patient monitoring: heart rate, continuous pulse ox and blood pressure Approach: midline Location: L3-L4 Injection technique: LOR saline  Needle:  Needle type: Tuohy  Needle gauge: 17 G Needle length: 9 cm and 9 Needle insertion depth: 9 cm Catheter type: closed end flexible Catheter size: 19 Gauge Catheter at skin depth: 15 cm Test dose: negative and 1.5% lidocaine with Epi 1:200 K  Assessment Sensory level: T10 Events: blood not aspirated, injection not painful, no injection resistance, no paresthesia and negative IV test  Additional Notes 1 attempt Pt. Evaluated and documentation done after procedure finished. Patient identified. Risks/Benefits/Options discussed with patient including but not limited to bleeding, infection, nerve damage, paralysis, failed block, incomplete pain control, headache, blood pressure changes, nausea, vomiting, reactions to medication both or allergic, itching and postpartum back pain. Confirmed with bedside nurse the patient's most recent platelet count. Confirmed with patient that they are not currently taking any anticoagulation, have any bleeding history or any family history of bleeding disorders. Patient expressed understanding and wished to proceed. All questions were answered. Sterile technique was used throughout the entire procedure. Please see nursing notes for vital signs. Test dose was given through epidural catheter and negative prior to continuing to dose epidural or start infusion. Warning signs of  high block given to the patient including shortness of breath, tingling/numbness in hands, complete motor block, or any concerning symptoms with instructions to call for help. Patient was given instructions on fall risk and not to get out of bed. All questions and concerns addressed with instructions to call with any issues or inadequate analgesia.   Patient tolerated the insertion well without immediate complications.Reason for block:procedure for pain

## 2020-07-05 NOTE — H&P (Signed)
Date of Initial H&P: 07/04/2020  History reviewed, patient examined, no change in status, stable to proceed with IOL.  Cytotec placed vaginally.

## 2020-07-05 NOTE — Progress Notes (Signed)
Inpatient Diabetes Program Recommendations  AACE/ADA: New Consensus Statement on Inpatient Glycemic Control (2015)  Target Ranges:  Prepandial:   less than 140 mg/dL      Peak postprandial:   less than 180 mg/dL (1-2 hours)      Critically ill patients:  140 - 180 mg/dL   Lab Results  Component Value Date   GLUCAP 131 (H) 07/05/2020   HGBA1C 7.0 (H) 04/18/2020    Review of Glycemic Control Results for Yvonne Rios, Yvonne Rios (MRN [id number]) as of 07/05/2020 12:40  Ref. Range 07/05/2020 09:11 07/05/2020 12:07 07/05/2020 12:38  Glucose-Capillary Latest Ref Range: 70 - 99 mg/dL 818 (H) 299 (H) 371 (H)   Diabetes history: DM 2 Outpatient Diabetes medications:  Lantus 110 units q HS Humalog 35 units with breakfast and lunch, Humalog 45 units with supper Current orders for Inpatient glycemic control: EndoTool/IV insulin Inpatient Diabetes Program Recommendations:    Note patient admitted for induction.  Agree with EndoTool/IV insulin during labor.   Thanks,  Beryl Meager, RN, BC-ADM Inpatient Diabetes Coordinator Pager [phone] (8a-5p)

## 2020-07-06 ENCOUNTER — Encounter: Payer: Self-pay | Admitting: Obstetrics and Gynecology

## 2020-07-06 DIAGNOSIS — O2412 Pre-existing diabetes mellitus, type 2, in childbirth: Secondary | ICD-10-CM | POA: Diagnosis not present

## 2020-07-06 DIAGNOSIS — Z794 Long term (current) use of insulin: Secondary | ICD-10-CM

## 2020-07-06 DIAGNOSIS — Z3A37 37 weeks gestation of pregnancy: Secondary | ICD-10-CM | POA: Diagnosis not present

## 2020-07-06 DIAGNOSIS — E1165 Type 2 diabetes mellitus with hyperglycemia: Secondary | ICD-10-CM | POA: Diagnosis not present

## 2020-07-06 LAB — BASIC METABOLIC PANEL
Anion gap: 9 (ref 5–15)
BUN: 5 mg/dL — ABNORMAL LOW (ref 6–20)
CO2: 23 mmol/L (ref 22–32)
Calcium: 8.6 mg/dL — ABNORMAL LOW (ref 8.9–10.3)
Chloride: 106 mmol/L (ref 98–111)
Creatinine, Ser: 0.52 mg/dL (ref 0.44–1.00)
GFR, Estimated: >60 mL/min (ref >60)
Glucose, Bld: 98 mg/dL (ref 70–99)
Potassium: 3.6 mmol/L (ref 3.5–5.1)
Sodium: 138 mmol/L (ref 135–145)

## 2020-07-06 LAB — GLUCOSE, CAPILLARY
Glucose-Capillary: 103 mg/dL — ABNORMAL HIGH (ref 70–99)
Glucose-Capillary: 104 mg/dL — ABNORMAL HIGH (ref 70–99)
Glucose-Capillary: 107 mg/dL — ABNORMAL HIGH (ref 70–99)
Glucose-Capillary: 108 mg/dL — ABNORMAL HIGH (ref 70–99)
Glucose-Capillary: 109 mg/dL — ABNORMAL HIGH (ref 70–99)
Glucose-Capillary: 115 mg/dL — ABNORMAL HIGH (ref 70–99)
Glucose-Capillary: 116 mg/dL — ABNORMAL HIGH (ref 70–99)
Glucose-Capillary: 129 mg/dL — ABNORMAL HIGH (ref 70–99)
Glucose-Capillary: 166 mg/dL — ABNORMAL HIGH (ref 70–99)
Glucose-Capillary: 89 mg/dL (ref 70–99)

## 2020-07-06 LAB — RPR: RPR Ser Ql: NONREACTIVE

## 2020-07-06 LAB — HEMOGLOBIN A1C
Hgb A1c MFr Bld: 7.3 % — ABNORMAL HIGH (ref 4.8–5.6)
Mean Plasma Glucose: 162.81 mg/dL

## 2020-07-06 MED ORDER — SIMETHICONE 80 MG PO CHEW: 80.0000 mg | CHEWABLE_TABLET | ORAL | Status: DC | PRN

## 2020-07-06 MED ORDER — TETANUS-DIPHTH-ACELL PERTUSSIS 5-2.5-18.5 LF-MCG/0.5 IM SUSY
0.5000 mL | PREFILLED_SYRINGE | Freq: Once | INTRAMUSCULAR | Status: DC
  Filled 2020-07-06: qty 0.5

## 2020-07-06 MED ORDER — SENNOSIDES-DOCUSATE SODIUM 8.6-50 MG PO TABS
2.0000 | ORAL_TABLET | ORAL | Status: DC
  Administered 2020-07-06 – 2020-07-08 (×3): 2 via ORAL
  Filled 2020-07-06 (×3): qty 2

## 2020-07-06 MED ORDER — DIPHENOXYLATE-ATROPINE 2.5-0.025 MG PO TABS: 2.0000 | ORAL_TABLET | Freq: Four times a day (QID) | ORAL | Status: DC | PRN

## 2020-07-06 MED ORDER — CLONAZEPAM 0.5 MG PO TABS
0.5000 mg | ORAL_TABLET | Freq: Two times a day (BID) | ORAL | Status: DC | PRN
Start: 2020-07-06 — End: 2020-07-09
  Filled 2020-07-06: qty 1

## 2020-07-06 MED ORDER — INSULIN GLARGINE 100 UNIT/ML ~~LOC~~ SOLN
55.0000 [IU] | Freq: Every day | SUBCUTANEOUS | Status: DC
  Administered 2020-07-06: 55 [IU] via SUBCUTANEOUS
  Filled 2020-07-06 (×3): qty 0.55

## 2020-07-06 MED ORDER — COCONUT OIL OIL
1.0000 "application " | TOPICAL_OIL | Status: DC | PRN
  Administered 2020-07-06: 1 via TOPICAL
  Filled 2020-07-06: qty 120

## 2020-07-06 MED ORDER — PRENATAL MULTIVITAMIN CH
1.0000 | ORAL_TABLET | Freq: Every day | ORAL | Status: DC
  Administered 2020-07-06 – 2020-07-08 (×3): 1 via ORAL
  Filled 2020-07-06 (×3): qty 1

## 2020-07-06 MED ORDER — FAMOTIDINE 20 MG PO TABS
20.0000 mg | ORAL_TABLET | Freq: Every day | ORAL | Status: DC
  Administered 2020-07-07 – 2020-07-08 (×2): 20 mg via ORAL
  Filled 2020-07-06 (×3): qty 1

## 2020-07-06 MED ORDER — ALBUTEROL SULFATE HFA 108 (90 BASE) MCG/ACT IN AERS
2.0000 | INHALATION_SPRAY | RESPIRATORY_TRACT | Status: DC | PRN
  Filled 2020-07-06: qty 6.7

## 2020-07-06 MED ORDER — ONDANSETRON HCL 4 MG/2ML IJ SOLN: 4.0000 mg | INTRAMUSCULAR | Status: DC | PRN

## 2020-07-06 MED ORDER — INSULIN ASPART 100 UNIT/ML ~~LOC~~ SOLN: 0.0000 [IU] | Freq: Three times a day (TID) | SUBCUTANEOUS | Status: DC

## 2020-07-06 MED ORDER — INSULIN ASPART 100 UNIT/ML ~~LOC~~ SOLN
15.0000 [IU] | Freq: Three times a day (TID) | SUBCUTANEOUS | Status: DC
  Administered 2020-07-06 (×2): 15 [IU] via SUBCUTANEOUS
  Filled 2020-07-06 (×2): qty 1

## 2020-07-06 MED ORDER — OXYCODONE-ACETAMINOPHEN 5-325 MG PO TABS
1.0000 | ORAL_TABLET | ORAL | Status: DC | PRN
  Administered 2020-07-06 – 2020-07-08 (×3): 1 via ORAL
  Filled 2020-07-06 (×3): qty 1

## 2020-07-06 MED ORDER — DIPHENHYDRAMINE HCL 25 MG PO CAPS: 25.0000 mg | ORAL_CAPSULE | Freq: Four times a day (QID) | ORAL | Status: DC | PRN

## 2020-07-06 MED ORDER — MOMETASONE FURO-FORMOTEROL FUM 200-5 MCG/ACT IN AERO
2.0000 | INHALATION_SPRAY | Freq: Two times a day (BID) | RESPIRATORY_TRACT | Status: DC
  Administered 2020-07-06 – 2020-07-08 (×3): 2 via RESPIRATORY_TRACT
  Filled 2020-07-06: qty 8.8

## 2020-07-06 MED ORDER — ACETAMINOPHEN 325 MG PO TABS: 650.0000 mg | ORAL_TABLET | ORAL | Status: DC | PRN

## 2020-07-06 MED ORDER — DIBUCAINE (PERIANAL) 1 % EX OINT: 1.0000 "application " | TOPICAL_OINTMENT | CUTANEOUS | Status: DC | PRN

## 2020-07-06 MED ORDER — IBUPROFEN 600 MG PO TABS
600.0000 mg | ORAL_TABLET | Freq: Four times a day (QID) | ORAL | Status: DC
  Administered 2020-07-06 – 2020-07-08 (×11): 600 mg via ORAL
  Filled 2020-07-06 (×12): qty 1

## 2020-07-06 MED ORDER — OXYCODONE-ACETAMINOPHEN 5-325 MG PO TABS
2.0000 | ORAL_TABLET | ORAL | Status: DC | PRN
  Administered 2020-07-06: 2 via ORAL
  Filled 2020-07-06: qty 2

## 2020-07-06 MED ORDER — SERTRALINE HCL 25 MG PO TABS
50.0000 mg | ORAL_TABLET | Freq: Every day | ORAL | Status: DC
  Administered 2020-07-06 – 2020-07-08 (×3): 50 mg via ORAL
  Filled 2020-07-06 (×3): qty 2
  Filled 2020-07-06: qty 1

## 2020-07-06 MED ORDER — AMPHETAMINE-DEXTROAMPHET ER 5 MG PO CP24
20.0000 mg | ORAL_CAPSULE | Freq: Two times a day (BID) | ORAL | Status: DC
  Administered 2020-07-07 – 2020-07-08 (×3): 20 mg via ORAL
  Filled 2020-07-06 (×4): qty 4

## 2020-07-06 MED ORDER — WITCH HAZEL-GLYCERIN EX PADS: 1.0000 "application " | MEDICATED_PAD | CUTANEOUS | Status: DC | PRN

## 2020-07-06 MED ORDER — ONDANSETRON HCL 4 MG PO TABS
4.0000 mg | ORAL_TABLET | ORAL | Status: DC | PRN
  Filled 2020-07-06: qty 1

## 2020-07-06 MED ORDER — BENZOCAINE-MENTHOL 20-0.5 % EX AERO
1.0000 "application " | INHALATION_SPRAY | CUTANEOUS | Status: DC | PRN
  Administered 2020-07-06: 1 via TOPICAL
  Filled 2020-07-06: qty 56

## 2020-07-06 MED ORDER — MONTELUKAST SODIUM 10 MG PO TABS
10.0000 mg | ORAL_TABLET | Freq: Every day | ORAL | Status: DC
  Administered 2020-07-06 – 2020-07-08 (×4): 10 mg via ORAL
  Filled 2020-07-06 (×5): qty 1

## 2020-07-06 NOTE — Lactation Note (Signed)
This note was copied from a baby's chart. Lactation Consultation Note  Patient Name: Yvonne Rios Today's Date: 07/06/2020 Reason for consult: L&D Initial assessment;1st time breastfeeding;Early term 37-38.6wks Age:30 hours  Lactation to the L&D room for initial visit. Mother is holding the baby skin to skin. LC assisted with attempting to latch, baby will suckle twice and then pushes the breast out of his mouth. Encouraged hand expression and skin to skin. Taught proper technique for hand expression and spoon feeding. LC and Mother expressed and fed about . Baby tolerated spoon feed well. He became more alert and latched off and on and fed for about 10 minutes. Mother stated she could feel the tugs, breast tissue was noted to be pulling during feed. Baby was left skin to skin with Mother.  Discussed once she is transferred to postpartum, LC will come and review "Understanding Postpartum and Newborn Care" booklet at bedside. Mother has no further questions at this time.   Maternal Data Formula Feeding for Exclusion: No Has patient been taught Hand Expression?: Yes Does the patient have breastfeeding experience prior to this delivery?: No  Feeding Feeding Type: Breast Fed  LATCH Score Latch: Repeated attempts needed to sustain latch, nipple held in mouth throughout feeding, stimulation needed to elicit sucking reflex.  Audible Swallowing: A few with stimulation  Type of Nipple: Everted at rest and after stimulation  Comfort (Breast/Nipple): Soft / non-tender  Hold (Positioning): Full assist, staff holds infant at breast  LATCH Score: 6  Interventions Interventions: Breast feeding basics reviewed;Assisted with latch;Skin to skin;Hand express;Breast compression;Adjust position;Support pillows;Position options  Lactation Tools Discussed/Used Tools: Other (comment) (spoon fed colostrum) WIC Program: Yes   Consult Status Consult Status: Follow-up Date:  07/06/20 Follow-up type: Call as needed    Sherika Kubicki D Yeison Sippel 07/06/2020, 10:24 AM

## 2020-07-06 NOTE — Progress Notes (Signed)
Pt arrived to mother baby almost done with her lunch. No CBG check prior to eating. Will given scheduled insulin since patient ate her entire tray but will get back on track with SS and scheduled insulin this evening.   Pt educated to call staff prior to eating meals for CBG check.

## 2020-07-06 NOTE — Progress Notes (Signed)
Subjective:  Feeling more pressure and some vaginal burning  Objective:   Vitals: Blood pressure 129/76, pulse 71, temperature 98.4 F (36.9 C), temperature source Oral, resp. rate 20, height 5\' 8"  (1.727 m), weight (!) 167.8 kg, last menstrual period 10/18/2019, SpO2 97 %. General: NAD Abdomen: gravid, non-tender Cervical Exam:  Dilation: 6 Effacement (%): 90 Cervical Position: Middle Station: 0 Presentation: Vertex Exam by:: [email or web address], MD  FHT: 130, moderate, +accels, no decels Toco: q2-49min  Results for orders placed or performed during the hospital encounter of 07/05/20 (from the past 24 hour(s))  SARS Coronavirus 2 by RT PCR (hospital order, performed in [facility] hospital lab) Nasopharyngeal Nasopharyngeal Swab     Status: None   Collection Time: 07/05/20  8:41 AM   Specimen: Nasopharyngeal Swab  Result Value Ref Range   SARS Coronavirus 2 NEGATIVE NEGATIVE  Basic metabolic panel     Status: Abnormal   Collection Time: 07/05/20  8:41 AM  Result Value Ref Range   Sodium 137 135 - 145 mmol/L   Potassium 3.8 3.5 - 5.1 mmol/L   Chloride 104 98 - 111 mmol/L   CO2 22 22 - 32 mmol/L   Glucose, Bld 140 (H) 70 - 99 mg/dL   BUN 8 6 - 20 mg/dL   Creatinine, Ser 07/07/20 0.44 - 1.00 mg/dL   Calcium 8.7 (L) 8.9 - 10.3 mg/dL   GFR, Estimated 3.15 >40 mL/min   Anion gap 11 5 - 15  Glucose, capillary     Status: Abnormal   Collection Time: 07/05/20  9:11 AM  Result Value Ref Range   Glucose-Capillary 159 (H) 70 - 99 mg/dL  CBC     Status: Abnormal   Collection Time: 07/05/20  9:55 AM  Result Value Ref Range   WBC 12.3 (H) 4.0 - 10.5 K/uL   RBC 4.09 3.87 - 5.11 MIL/uL   Hemoglobin 10.6 (L) 12.0 - 15.0 g/dL   HCT 07/07/20 (L) 67.6 - 19.5 %   MCV 80.4 80.0 - 100.0 fL   MCH 25.9 (L) 26.0 - 34.0 pg   MCHC 32.2 30.0 - 36.0 g/dL   RDW 09.3 (H) 26.7 - 12.4 %   Platelets 257 150 - 400 K/uL   nRBC 0.0 0.0 - 0.2 %  Type and screen     Status: None   Collection Time: 07/05/20 10:07 AM   Result Value Ref Range   ABO/RH(D) O POS    Antibody Screen NEG    Sample Expiration      07/08/2020,2359 Performed at [facility], [address] [place], [place]   ABO/Rh     Status: None   Collection Time: 07/05/20 10:50 AM  Result Value Ref Range   ABO/RH(D)      O POS Performed at [facility], [address] [place], [place]   Glucose, capillary     Status: Abnormal   Collection Time: 07/05/20 12:07 PM  Result Value Ref Range   Glucose-Capillary 130 (H) 70 - 99 mg/dL  Glucose, capillary     Status: Abnormal   Collection Time: 07/05/20 12:38 PM  Result Value Ref Range   Glucose-Capillary 131 (H) 70 - 99 mg/dL  Glucose, capillary     Status: Abnormal   Collection Time: 07/05/20  1:38 PM  Result Value Ref Range   Glucose-Capillary 107 (H) 70 - 99 mg/dL  Glucose, capillary     Status: None   Collection Time: 07/05/20  2:39 PM  Result Value Ref Range   Glucose-Capillary 92 70 - 99 mg/dL  Glucose, capillary     Status: None   Collection Time: 07/05/20  3:39 PM  Result Value Ref Range   Glucose-Capillary 79 70 - 99 mg/dL  Glucose, capillary     Status: None   Collection Time: 07/05/20  4:49 PM  Result Value Ref Range   Glucose-Capillary 80 70 - 99 mg/dL  Glucose, capillary     Status: None   Collection Time: 07/05/20  5:37 PM  Result Value Ref Range   Glucose-Capillary 96 70 - 99 mg/dL  Glucose, capillary     Status: None   Collection Time: 07/05/20  6:45 PM  Result Value Ref Range   Glucose-Capillary 89 70 - 99 mg/dL  Glucose, capillary     Status: Abnormal   Collection Time: 07/05/20  7:44 PM  Result Value Ref Range   Glucose-Capillary 109 (H) 70 - 99 mg/dL  Glucose, capillary     Status: Abnormal   Collection Time: 07/05/20  9:23 PM  Result Value Ref Range   Glucose-Capillary 166 (H) 70 - 99 mg/dL  Glucose, capillary     Status: Abnormal   Collection Time: 07/05/20 10:31 PM  Result Value Ref Range    Glucose-Capillary 133 (H) 70 - 99 mg/dL  Glucose, capillary     Status: Abnormal   Collection Time: 07/05/20 11:23 PM  Result Value Ref Range   Glucose-Capillary 128 (H) 70 - 99 mg/dL  Glucose, capillary     Status: Abnormal   Collection Time: 07/06/20 12:24 AM  Result Value Ref Range   Glucose-Capillary 115 (H) 70 - 99 mg/dL  Glucose, capillary     Status: Abnormal   Collection Time: 07/06/20  1:27 AM  Result Value Ref Range   Glucose-Capillary 104 (H) 70 - 99 mg/dL  Glucose, capillary     Status: Abnormal   Collection Time: 07/06/20  3:14 AM  Result Value Ref Range   Glucose-Capillary 107 (H) 70 - 99 mg/dL  Glucose, capillary     Status: Abnormal   Collection Time: 07/06/20  4:18 AM  Result Value Ref Range   Glucose-Capillary 103 (H) 70 - 99 mg/dL    Assessment:   30 y.o. [id number] [redacted]w[redacted]d IOL poorly controlled DM 2  Plan:   1) Labor - now in active phase, good change in station as well  2) Fetus - cat I  3) DM 2 - continue endo tool, BG control has been good  Vena Austria, MD, [facility], [facility] 07/06/2020, 4:37 AM

## 2020-07-06 NOTE — Discharge Summary (Signed)
OB Discharge Summary     Patient Name: Yvonne Rios DOB: 1990-09-13 MRN: [id number]  Date of admission: 07/05/2020 Delivering MD: Malachy Mood   Date of discharge: 07/08/2020  Admitting diagnosis: Pre-existing type 2 diabetes mellitus with hyperglycemia during pregnancy in third trimester ([place]) [O24.113, E11.65]   Intrauterine pregnancy: [redacted]w[redacted]d     Secondary diagnosis:  Active Problems:   Pre-existing type 2 diabetes mellitus with hyperglycemia during pregnancy in third trimester [facility])   Encounter for postpartum care and examination after delivery   Care and examination of lactating mother  Additional problems:  Morbid obesity Body mass index is 56.26 kg/m.  Mild intermittent asthma     Discharge diagnosis: Term Pregnancy Delivered                                                                                                Post partum procedures:none  Augmentation: AROM, Pitocin and Cytotec  Complications: None  Hospital course:  Induction of Labor With Vaginal Delivery   30 y.o. yo [id number] at [redacted]w[redacted]d was admitted to the hospital 07/05/2020 for induction of labor.  Indication for induction: TYPE 2 DM.  Patient had an uncomplicated labor course as follows: Membrane Rupture Time/Date: 10:16 PM ,07/05/2020   Delivery Method:Vaginal, Spontaneous  Episiotomy: None  Lacerations:    Details of delivery can be found in separate delivery note.  Patient had a routine postpartum course. Diabetes care coordinated postpartum with Diabetes Coordinator.  Patient is discharged home 07/08/20.  Newborn Data: Birth date:07/06/2020  Birth time:8:58 AM  Gender:Female  Living status:Living  Apgars:8 ,9  Weight:3780 g   Physical exam  Vitals:   07/07/20 1630 07/07/20 2322 07/07/20 2329 07/08/20 0810  BP: (!) 99/55  123/63 (!) 116/57  Pulse: 74  60 62  Resp: 18   20  Temp: 98 F (36.7 C) 97.7 F (36.5 C)  98 F (36.7 C)  TempSrc:  Oral    SpO2: 100% 98% 99% 100%  Weight:       Height:       General: alert, cooperative and no distress Lochia: appropriate Uterine Fundus: firm Incision: n/a DVT Evaluation: No evidence of DVT seen on physical exam. No significant calf/ankle edema. Labs: Lab Results  Component Value Date   WBC 14.8 (H) 07/07/2020   HGB 8.8 (L) 07/07/2020   HCT 28.2 (L) 07/07/2020   MCV 81.7 07/07/2020   PLT 234 07/07/2020   CMP Latest Ref Rng & Units 07/06/2020  Glucose 70 - 99 mg/dL 98  BUN 6 - 20 mg/dL 5(L)  Creatinine 0.44 - 1.00 mg/dL 0.52  Sodium 135 - 145 mmol/L 138  Potassium 3.5 - 5.1 mmol/L 3.6  Chloride 98 - 111 mmol/L 106  CO2 22 - 32 mmol/L 23  Calcium 8.9 - 10.3 mg/dL 8.6(L)  Total Protein 6.5 - 8.1 g/dL -  Total Bilirubin 0.3 - 1.2 mg/dL -  Alkaline Phos 38 - 126 U/L -  AST 15 - 41 U/L -  ALT 0 - 44 U/L -    Discharge instruction: per After Visit Summary and "Baby and Me Booklet".  After visit meds:  Allergies as of 07/08/2020      Reactions   Cefprozil Other (See Comments)   Amoxil [amoxicillin] Rash   Augmentin [amoxicillin-pot Clavulanate] Rash      Medication List    STOP taking these medications   Accu-Chek Softclix Lancets lancets   ALLERGIST TRAY 1CC 27GX3/8" 27G X 3/8" 1 ML Kit Generic drug: Tuberculin-Allergy Syringes   aspirin EC 81 MG tablet   cetirizine 10 MG tablet Commonly known as: ZYRTEC   cyclobenzaprine 10 MG tablet Commonly known as: FLEXERIL   diphenoxylate-atropine 2.5-0.025 MG tablet Commonly known as: LOMOTIL   HumuLIN R U-500 KwikPen 500 UNIT/ML kwikpen Generic drug: insulin regular human CONCENTRATED   insulin glargine 100 UNIT/ML Solostar Pen Commonly known as: LANTUS   NovoFine 32G X 6 MM Misc Generic drug: Insulin Pen Needle   terconazole 0.4 % vaginal cream Commonly known as: Terazol 7     TAKE these medications   Accu-Chek Aviva Plus w/Device Kit 1 Units by Does not apply route in the morning, at noon, in the evening, and at bedtime.   acetaminophen 325  MG tablet Commonly known as: Tylenol Take 2 tablets (650 mg total) by mouth every 4 (four) hours as needed (for pain scale < 4).   Adderall XR 20 MG 24 hr capsule Generic drug: amphetamine-dextroamphetamine Take 1 capsule (20 mg total) by mouth 2 (two) times daily.   AeroChamber Plus inhaler Use as instructed   albuterol 108 (90 Base) MCG/ACT inhaler Commonly known as: VENTOLIN HFA Inhale 2 puffs into the lungs every 4 (four) hours as needed for wheezing or shortness of breath.   clonazePAM 0.5 MG tablet Commonly known as: KLONOPIN Take 1 tablet (0.5 mg total) by mouth 2 (two) times daily as needed for anxiety. TAKE 1/2 TABLET BY MOUTH EVERY MORNING, TAKE 1/2 TABLET BY MOUTH EVERY AFTERNOON & TAKE ONE TABLET BY MOUTH AT BEDTIME   EPINEPHrine 0.3 mg/0.3 mL Soaj injection Commonly known as: EPI-PEN USE AS DIRECTED PER MD FOR ANAPHYLAXIS   famotidine 20 MG tablet Commonly known as: PEPCID Take 1 tablet (20 mg total) by mouth daily.   Fluticasone-Salmeterol 250-50 MCG/DOSE Aepb Commonly known as: ADVAIR Inhale 1 puff into the lungs daily.   folic acid 1 MG tablet Commonly known as: FOLVITE Take 1 tablet (1 mg total) by mouth daily.   glipiZIDE 10 MG 24 hr tablet Commonly known as: GLUCOTROL XL Take 1 tablet (10 mg total) by mouth daily with breakfast. Start taking on: July 09, 2020   glucose blood test strip Use as instructed   ibuprofen 600 MG tablet Commonly known as: ADVIL Take 1 tablet (600 mg total) by mouth every 6 (six) hours.   metFORMIN 500 MG tablet Commonly known as: GLUCOPHAGE Take 1 tablet (500 mg total) by mouth 4 (four) times daily.   montelukast 10 MG tablet Commonly known as: SINGULAIR Take 1 tablet (10 mg total) by mouth at bedtime.   pantoprazole 40 MG tablet Commonly known as: PROTONIX Take 1 tablet (40 mg total) by mouth daily.   prenatal multivitamin Tabs tablet Take 1 tablet by mouth daily at 12 noon.   sertraline 50 MG  tablet Commonly known as: Zoloft Take 1 tablet (50 mg total) by mouth daily.   Symbicort 160-4.5 MCG/ACT inhaler Generic drug: budesonide-formoterol Inhale 2 puffs into the lungs in the morning and at bedtime.       Diet: carb modified diet  Activity: Advance as tolerated. Pelvic rest for  6 weeks.   Outpatient follow up:6 weeks Follow up Appt:  Call to schedule 2 week mood check visit with [facility] Follow up Visit: Call to schedule routine 6 week postpartum visit. Postpartum contraception: unsure Baby Feeding: Bottle - pumping breastmilk and supplementing with formula Disposition:home with mother   07/08/2020 Orlie Pollen, CNM

## 2020-07-06 NOTE — Progress Notes (Signed)
Subjective:  Comfortable with epidural in place  Objective:   Vitals: Blood pressure 116/61, pulse 63, temperature 98.5 F (36.9 C), temperature source Oral, resp. rate 20, height 5\' 8"  (1.727 m), weight (!) 167.8 kg, last menstrual period 10/18/2019, SpO2 97 %. General: NAD Abdomen: Gravid, non-tender Cervical Exam:  Dilation: 4 Effacement (%): 70 Cervical Position: Posterior Station: -3 Presentation: Vertex Exam by:: Faiga Stones, MD  FHT: 130, moderate, +accels, no decels Toco: q8min  Results for orders placed or performed during the hospital encounter of 07/05/20 (from the past 24 hour(s))  SARS Coronavirus 2 by RT PCR (hospital order, performed in [facility] hospital lab) Nasopharyngeal Nasopharyngeal Swab     Status: None   Collection Time: 07/05/20  8:41 AM   Specimen: Nasopharyngeal Swab  Result Value Ref Range   SARS Coronavirus 2 NEGATIVE NEGATIVE  Basic metabolic panel     Status: Abnormal   Collection Time: 07/05/20  8:41 AM  Result Value Ref Range   Sodium 137 135 - 145 mmol/L   Potassium 3.8 3.5 - 5.1 mmol/L   Chloride 104 98 - 111 mmol/L   CO2 22 22 - 32 mmol/L   Glucose, Bld 140 (H) 70 - 99 mg/dL   BUN 8 6 - 20 mg/dL   Creatinine, Ser 07/07/20 0.44 - 1.00 mg/dL   Calcium 8.7 (L) 8.9 - 10.3 mg/dL   GFR, Estimated 4.27 >06 mL/min   Anion gap 11 5 - 15  Glucose, capillary     Status: Abnormal   Collection Time: 07/05/20  9:11 AM  Result Value Ref Range   Glucose-Capillary 159 (H) 70 - 99 mg/dL  CBC     Status: Abnormal   Collection Time: 07/05/20  9:55 AM  Result Value Ref Range   WBC 12.3 (H) 4.0 - 10.5 K/uL   RBC 4.09 3.87 - 5.11 MIL/uL   Hemoglobin 10.6 (L) 12.0 - 15.0 g/dL   HCT 07/07/20 (L) 76.2 - 83.1 %   MCV 80.4 80.0 - 100.0 fL   MCH 25.9 (L) 26.0 - 34.0 pg   MCHC 32.2 30.0 - 36.0 g/dL   RDW 51.7 (H) 61.6 - 07.3 %   Platelets 257 150 - 400 K/uL   nRBC 0.0 0.0 - 0.2 %  Type and screen     Status: None   Collection Time: 07/05/20 10:07 AM  Result  Value Ref Range   ABO/RH(D) O POS    Antibody Screen NEG    Sample Expiration      07/08/2020,2359 Performed at [facility], [address] [place], [place]   ABO/Rh     Status: None   Collection Time: 07/05/20 10:50 AM  Result Value Ref Range   ABO/RH(D)      O POS Performed at [facility], [address] [place], [place]   Glucose, capillary     Status: Abnormal   Collection Time: 07/05/20 12:07 PM  Result Value Ref Range   Glucose-Capillary 130 (H) 70 - 99 mg/dL  Glucose, capillary     Status: Abnormal   Collection Time: 07/05/20 12:38 PM  Result Value Ref Range   Glucose-Capillary 131 (H) 70 - 99 mg/dL  Glucose, capillary     Status: Abnormal   Collection Time: 07/05/20  1:38 PM  Result Value Ref Range   Glucose-Capillary 107 (H) 70 - 99 mg/dL  Glucose, capillary     Status: None   Collection Time: 07/05/20  2:39 PM  Result Value  Ref Range   Glucose-Capillary 92 70 - 99 mg/dL  Glucose, capillary     Status: None   Collection Time: 07/05/20  3:39 PM  Result Value Ref Range   Glucose-Capillary 79 70 - 99 mg/dL  Glucose, capillary     Status: None   Collection Time: 07/05/20  4:49 PM  Result Value Ref Range   Glucose-Capillary 80 70 - 99 mg/dL  Glucose, capillary     Status: None   Collection Time: 07/05/20  5:37 PM  Result Value Ref Range   Glucose-Capillary 96 70 - 99 mg/dL  Glucose, capillary     Status: None   Collection Time: 07/05/20  6:45 PM  Result Value Ref Range   Glucose-Capillary 89 70 - 99 mg/dL  Glucose, capillary     Status: Abnormal   Collection Time: 07/05/20  7:44 PM  Result Value Ref Range   Glucose-Capillary 109 (H) 70 - 99 mg/dL  Glucose, capillary     Status: Abnormal   Collection Time: 07/05/20  9:23 PM  Result Value Ref Range   Glucose-Capillary 166 (H) 70 - 99 mg/dL  Glucose, capillary     Status: Abnormal   Collection Time: 07/05/20 10:31 PM  Result Value Ref Range   Glucose-Capillary 133  (H) 70 - 99 mg/dL  Glucose, capillary     Status: Abnormal   Collection Time: 07/05/20 11:23 PM  Result Value Ref Range   Glucose-Capillary 128 (H) 70 - 99 mg/dL  Glucose, capillary     Status: Abnormal   Collection Time: 07/06/20 12:24 AM  Result Value Ref Range   Glucose-Capillary 115 (H) 70 - 99 mg/dL    Assessment:   30 y.o. [id number] [redacted]w[redacted]d IOL uncontrolled DM II  Plan:   1) Labor - continue pitocin.  Good MVU's.  No cervical change in last two hours in regard to dilation but some effacement.  Peanut ball to help with descent  2) Fetus - cat I - vanc for GBS ppx  3) DM II - continue endo tool  Vena Austria, MD, [facility], [facility] 07/06/2020, 12:31 AM

## 2020-07-07 LAB — CBC
HCT: 28.2 % — ABNORMAL LOW (ref 36.0–46.0)
Hemoglobin: 8.8 g/dL — ABNORMAL LOW (ref 12.0–15.0)
MCH: 25.5 pg — ABNORMAL LOW (ref 26.0–34.0)
MCHC: 31.2 g/dL (ref 30.0–36.0)
MCV: 81.7 fL (ref 80.0–100.0)
Platelets: 234 10*3/uL (ref 150–400)
RBC: 3.45 MIL/uL — ABNORMAL LOW (ref 3.87–5.11)
RDW: 16.5 % — ABNORMAL HIGH (ref 11.5–15.5)
WBC: 14.8 10*3/uL — ABNORMAL HIGH (ref 4.0–10.5)
nRBC: 0 % (ref 0.0–0.2)

## 2020-07-07 LAB — GLUCOSE, CAPILLARY
Glucose-Capillary: 78 mg/dL (ref 70–99)
Glucose-Capillary: 78 mg/dL (ref 70–99)
Glucose-Capillary: 93 mg/dL (ref 70–99)
Glucose-Capillary: 98 mg/dL (ref 70–99)
Glucose-Capillary: 108 mg/dL — ABNORMAL HIGH (ref 70–99)

## 2020-07-07 MED ORDER — INSULIN ASPART 100 UNIT/ML ~~LOC~~ SOLN
5.0000 [IU] | Freq: Three times a day (TID) | SUBCUTANEOUS | Status: DC
  Administered 2020-07-07: 5 [IU] via SUBCUTANEOUS
  Filled 2020-07-07 (×2): qty 1

## 2020-07-07 MED ORDER — INSULIN GLARGINE 100 UNIT/ML ~~LOC~~ SOLN
25.0000 [IU] | Freq: Every day | SUBCUTANEOUS | Status: DC
  Administered 2020-07-07: 25 [IU] via SUBCUTANEOUS
  Filled 2020-07-07 (×2): qty 0.25

## 2020-07-07 MED ORDER — INSULIN ASPART 100 UNIT/ML ~~LOC~~ SOLN: 5.0000 [IU] | Freq: Three times a day (TID) | SUBCUTANEOUS | Status: DC

## 2020-07-07 MED ORDER — INSULIN ASPART 100 UNIT/ML ~~LOC~~ SOLN
0.0000 [IU] | Freq: Three times a day (TID) | SUBCUTANEOUS | Status: DC
  Administered 2020-07-08: 4 [IU] via SUBCUTANEOUS
  Filled 2020-07-07: qty 1

## 2020-07-07 NOTE — Progress Notes (Signed)
Post Partum Day 1 Subjective: no complaints, up ad lib, voiding and tolerating PO  Objective: Blood pressure (!) 98/57, pulse 60, temperature 98.2 F (36.8 C), resp. rate 20, height 5\' 8"  (1.727 m), weight (!) 167.8 kg, last menstrual period 10/18/2019, SpO2 100 %, unknown if currently breastfeeding.  Physical Exam:  General: alert, cooperative, appears stated age, no distress and morbidly obese Lochia: appropriate Uterine Fundus: firm Incision: prineum intact  DVT Evaluation: No evidence of DVT seen on physical exam. Negative Homan's sign.  Recent Labs    07/05/20 0955 07/07/20 0645  HGB 10.6* 8.8*  HCT 32.9* 28.2*    Assessment/Plan: Plan for discharge tomorrow, Lactation consult and Contraception plan is undecided.  Discussed contracpetive methods I detail with her, including, nexplanon, IUDs, OCPS.   LOS: 2 days   07/09/20 07/07/2020, 10:07 AM

## 2020-07-07 NOTE — Progress Notes (Signed)
Paula Compton CNM notified of Diabetes Coordinator medication recommendations. Provider to make changes accordingly.

## 2020-07-07 NOTE — Progress Notes (Addendum)
Inpatient Diabetes Program Recommendations  AACE/ADA: New Consensus Statement on Inpatient Glycemic Control (2015)  Target Ranges:  Prepandial:   less than 140 mg/dL      Peak postprandial:   less than 180 mg/dL (1-2 hours)      Critically ill patients:  140 - 180 mg/dL   Lab Results  Component Value Date   GLUCAP 78 07/07/2020   HGBA1C 7.3 (H) 07/05/2020    Review of Glycemic Control Results for Yvonne Rios, Yvonne Rios (MRN [id number]) as of 07/07/2020 11:09  Ref. Range 07/06/2020 13:39 07/06/2020 17:45 07/06/2020 20:48 07/07/2020 06:08 07/07/2020 10:43  Glucose-Capillary Latest Ref Range: 70 - 99 mg/dL 740 (H) 89 814 (H) 78 78   Diabetes history: DM2 Outpatient Diabetes medications: U500 35 units tid, Lantus 105 units daily  Current orders for Inpatient glycemic control: Lantus 55 units qd + Novolog 15 units tid meal coverage if eats 50% meals + Novolog correction 0-20 units tid  Inpatient Diabetes Program Recommendations:   Consider the following: -Decrease Lantus to 25 units qd -Decrease Novolog meal coverage to 5 units tid meal coverage -Decrease Novolog correction to 0-9 units tid + hs 0-5 units  Spoke with patient regarding diabetes management. Patient was on Metformin 500 mg qd and Glucotrol XR 10 mg qd prior to pregnancy but was not limiting sugary drinks and foods along with carbohydrates. A1c @ this time was 9.4 on 12/28/19. Patient acknowledges need to change nutrition in the future regarding sugary drinks and foods.  Thank you, Billy Fischer. Ingeborg Fite, RN, MSN, CDE  Diabetes Coordinator Inpatient Glycemic Control Team Team Pager [phone] (8am-5pm) 07/07/2020 11:18 AM

## 2020-07-07 NOTE — Anesthesia Postprocedure Evaluation (Signed)
Anesthesia Post Note  Patient: Yvonne Rios  Procedure(s) Performed: AN AD HOC LABOR EPIDURAL  Patient location during evaluation: Mother Baby Anesthesia Type: Epidural Level of consciousness: oriented and awake and alert Pain management: pain level controlled Vital Signs Assessment: post-procedure vital signs reviewed and stable Respiratory status: spontaneous breathing and respiratory function stable Cardiovascular status: blood pressure returned to baseline and stable Postop Assessment: no headache, no backache, no apparent nausea or vomiting and able to ambulate Anesthetic complications: no   No complications documented.   Last Vitals:  Vitals:   07/06/20 2030 07/07/20 0035  BP: 119/62 123/63  Pulse: 64 60  Resp: 18   Temp: 36.6 C 36.6 C  SpO2: 99% 98%    Last Pain:  Vitals:   07/07/20 0035  TempSrc: Oral  PainSc: 3                  Yvonne Rios

## 2020-07-08 DIAGNOSIS — Z419 Encounter for procedure for purposes other than remedying health state, unspecified: Secondary | ICD-10-CM | POA: Diagnosis not present

## 2020-07-08 LAB — GLUCOSE, CAPILLARY
Glucose-Capillary: 87 mg/dL (ref 70–99)
Glucose-Capillary: 153 mg/dL — ABNORMAL HIGH (ref 70–99)
Glucose-Capillary: 81 mg/dL (ref 70–99)

## 2020-07-08 MED ORDER — METFORMIN HCL 500 MG PO TABS
500.0000 mg | ORAL_TABLET | Freq: Four times a day (QID) | ORAL | Status: DC
  Administered 2020-07-08 (×4): 500 mg via ORAL
  Filled 2020-07-08 (×4): qty 1

## 2020-07-08 MED ORDER — METFORMIN HCL 500 MG PO TABS: 500.0000 mg | ORAL_TABLET | Freq: Four times a day (QID) | ORAL | 2 refills | Status: AC

## 2020-07-08 MED ORDER — IBUPROFEN 600 MG PO TABS: 600.0000 mg | ORAL_TABLET | Freq: Four times a day (QID) | ORAL | 0 refills | Status: DC

## 2020-07-08 MED ORDER — GLIPIZIDE ER 10 MG PO TB24: 10.0000 mg | ORAL_TABLET | Freq: Every day | ORAL | 3 refills | Status: AC

## 2020-07-08 MED ORDER — ACETAMINOPHEN 325 MG PO TABS: 650.0000 mg | ORAL_TABLET | ORAL | Status: AC | PRN

## 2020-07-08 MED ORDER — LIVING WELL WITH DIABETES BOOK
Freq: Once | Status: AC
  Filled 2020-07-08: qty 1

## 2020-07-08 MED ORDER — PRENATAL MULTIVITAMIN CH: 1.0000 | ORAL_TABLET | Freq: Every day | ORAL | Status: AC

## 2020-07-08 MED ORDER — GLIPIZIDE ER 10 MG PO TB24
10.0000 mg | ORAL_TABLET | Freq: Every day | ORAL | Status: DC
  Filled 2020-07-08: qty 1

## 2020-07-08 NOTE — Discharge Instructions (Signed)

## 2020-07-08 NOTE — Progress Notes (Signed)
Patient discharged. Discharge instructions, prescriptions, and follow up appointments given to and reviewed with patient. Patient verbalized understanding. Pt to remain in rm with baby while baby boy is remaining in hospital.

## 2020-07-08 NOTE — Progress Notes (Addendum)
Inpatient Diabetes Program Recommendations  AACE/ADA: New Consensus Statement on Inpatient Glycemic Control (2015)  Target Ranges:  Prepandial:   less than 140 mg/dL      Peak postprandial:   less than 180 mg/dL (1-2 hours)      Critically ill patients:  140 - 180 mg/dL   Lab Results  Component Value Date   GLUCAP 87 07/08/2020   HGBA1C 7.3 (H) 07/05/2020    Review of Glycemic Control Results for Yvonne Rios, Yvonne Rios (MRN [id number]) as of 07/08/2020 09:00  Ref. Range 07/07/2020 10:43 07/07/2020 13:58 07/07/2020 19:42 07/07/2020 22:00 07/08/2020 08:56  Glucose-Capillary Latest Ref Range: 70 - 99 mg/dL 78 93 98 413 (H) 87   Diabetes history: DM2 Outpatient Diabetes medications: U500 35 units tid, Lantus 105 units daily  Current orders for Inpatient glycemic control: Lantus 25 units qd + Novolog 5 units tid meal coverage if eats 50% meals + Novolog correction 0-9 units tid  Inpatient Diabetes Program Recommendations:    Patient was on Metformin 500 mg qd and Glucotrol XR 10 mg qd prior to pregnancy but was not limiting sugary drinks and foods along with carbohydrates. Patient verbalizes plans to decrease sugars in her diet. Please consider: D/C Lantus and meal coverage insulin Continue Novolog 0-9 units correction tid while in the hospital Restart Metformin 500 mg qd + Glucotrol XR 10 mg qd  Thank you, Billy Fischer. Latressa Harries, RN, MSN, CDE  Diabetes Coordinator Inpatient Glycemic Control Team Team Pager [phone] (8am-5pm) 07/08/2020 9:03 AM

## 2020-07-08 NOTE — Lactation Note (Signed)
This note was copied from a baby's chart. Lactation Consultation Note  Mom and support person in the room. LC entered and asked mom how things went with pumping during the night. Mom stated that she did not pump overnight and she had given baby a bottle. LC asked mom what her plan was for pumping upon going home and mom stated that she would pump during the day.   Mom was encouraged to pump 8 or more times in 24 hours and to aim for pumping at least 1-2 times at night. LC spoke with mom about fullness vs engorgement and how to care for her breasts. LC informed mom that pumping 8 or more times per day can help to avoid engorgement and discomfort. LC also spoke with mom about using ice to reduce any swelling that she may experience.   LC discussed input and output, feeding guidelines, milk storage, poop color, and the importance of feeding breast milk prior to giving formula. LC also talked to mom about calling lactation for support if needed.   Mom stated that she had no further questions.   Plan:   Mom will pump 8 or more times per day in a 24 hour period with 1-2 times being at night.   Mom will feed baby according to feeding cues and practice frequent skin-to-skin.   Mom will feed baby expressed milk prior to giving baby formula.    Patient Name: Yvonne Rios BTDVV'O Date: 07/08/2020 Reason for consult: Follow-up assessment;1st time breastfeeding;Early term 37-38.6wks;Other (Comment) (pump) Age:4 hours  Maternal Data Has patient been taught Hand Expression?: Yes Does the patient have breastfeeding experience prior to this delivery?: No  Feeding Mother's Current Feeding Choice: Breast Milk and Formula  LATCH Score                    Lactation Tools Discussed/Used Tools: Pump Breast pump type: Double-Electric Breast Pump Pump Education: Setup, frequency, and cleaning;Milk Storage Reason for Pumping: Mom's choice Pumping frequency: q  2-3hrs  Interventions Interventions: Breast feeding basics reviewed;DEBP;Hand express  Discharge Discharge Education: Engorgement and breast care;Warning signs for feeding baby;Outpatient recommendation (pump frequency, output expectations, paced-bottle feeding) Pump: DEBP WIC Program: Yes  Consult Status Consult Status: Complete Date: 07/08/20 Follow-up type: Call as needed    [facility] 07/08/2020, 9:59 AM

## 2020-07-09 ENCOUNTER — Ambulatory Visit: Payer: Self-pay

## 2020-07-09 NOTE — Lactation Note (Signed)
This note was copied from a baby's chart. Lactation Consultation Note  Patient Name: Boy Demitria Hay QIONG'E Date: 07/09/2020 Reason for consult: Follow-up assessment Age:30 hours  Lactation Rounds: LC to the room for a visit. Mother and baby are asleep, baby was in bassinet. Mother states feeds are going well, and she has no questions about pumping. LC reviewed and encouraged feeding on demand and with cues. If baby is not cueing we encourage waking and feeding the baby by 3 hours from last feed. Reviewed "understanding Postpartum and Newborn care " booklet at bedside. Mother is pumping and stated WIC is going to give her a pump. LC reviewed the manual piece to the electric pump and encouraged her to take all parts home with her. Mother stated understanding with all teaching and has no questions.   Maternal Data Does the patient have breastfeeding experience prior to this delivery?: No  Feeding Mother's Current Feeding Choice: Breast Milk and Formula Nipple Type: Slow - flow  Lactation Tools Discussed/Used Tools: Pump Breast pump type: Manual;Double-Electric Breast Pump (encouraged to take all parts to the pump home with her, stated WIC is giving her a pump) Pump Education: Setup, frequency, and cleaning;Milk Storage Reason for Pumping: formula feeding, Mother's choice Pumping frequency: encouraged 8 pump sessions in 24 hours Pumped volume: 35 mL  Interventions Interventions: DEBP;Hand pump;Education;Hand express  Discharge    Consult Status Consult Status: Complete Follow-up type: Call as needed    Akif Weldy D Gwendolen Hewlett 07/09/2020, 9:35 AM

## 2020-07-22 ENCOUNTER — Ambulatory Visit: Payer: Medicaid Other | Admitting: Obstetrics and Gynecology

## 2020-07-28 ENCOUNTER — Ambulatory Visit: Payer: Medicaid Other | Admitting: Obstetrics and Gynecology

## 2020-08-05 DIAGNOSIS — Z419 Encounter for procedure for purposes other than remedying health state, unspecified: Secondary | ICD-10-CM | POA: Diagnosis not present

## 2020-08-08 ENCOUNTER — Other Ambulatory Visit: Payer: Self-pay

## 2020-08-08 ENCOUNTER — Telehealth: Payer: Self-pay | Admitting: Obstetrics and Gynecology

## 2020-08-08 ENCOUNTER — Encounter: Payer: Self-pay | Admitting: Obstetrics and Gynecology

## 2020-08-08 ENCOUNTER — Ambulatory Visit (INDEPENDENT_AMBULATORY_CARE_PROVIDER_SITE_OTHER): Payer: Medicaid Other | Admitting: Obstetrics and Gynecology

## 2020-08-08 VITALS — BP 118/68 | Ht 68.0 in | Wt 342.0 lb

## 2020-08-08 DIAGNOSIS — J45909 Unspecified asthma, uncomplicated: Secondary | ICD-10-CM | POA: Diagnosis not present

## 2020-08-08 DIAGNOSIS — Z1331 Encounter for screening for depression: Secondary | ICD-10-CM

## 2020-08-08 DIAGNOSIS — T7840XA Allergy, unspecified, initial encounter: Secondary | ICD-10-CM | POA: Diagnosis not present

## 2020-08-08 DIAGNOSIS — E119 Type 2 diabetes mellitus without complications: Secondary | ICD-10-CM | POA: Diagnosis not present

## 2020-08-08 DIAGNOSIS — Z0130 Encounter for examination of blood pressure without abnormal findings: Secondary | ICD-10-CM

## 2020-08-08 DIAGNOSIS — Z87891 Personal history of nicotine dependence: Secondary | ICD-10-CM | POA: Diagnosis not present

## 2020-08-08 DIAGNOSIS — Z7984 Long term (current) use of oral hypoglycemic drugs: Secondary | ICD-10-CM | POA: Diagnosis not present

## 2020-08-08 DIAGNOSIS — Z880 Allergy status to penicillin: Secondary | ICD-10-CM | POA: Diagnosis not present

## 2020-08-08 NOTE — Progress Notes (Signed)
Obstetrics & Gynecology Office Visit   Chief Complaint:  Chief Complaint  Patient presents with  . Post-op Follow-up    30 wk postpartum, no concerns. RM 4    History of Present Illness: 30 y.o. [id number] being seen for follow up blood pressure and postpartum depression check.  Baby underwent recent surgery for pyloric stenosis, is doing well.  She is doing well reports being back on her prepregnancy oral antihyperglycemic regimen.  No bleeding concerns.  Is breast feeding and reports some issues with milk supply.    Review of Systems: review of systems negative unless noted in HPI  Past Medical History:  Past Medical History:  Diagnosis Date  . ADD (attention deficit disorder)   . Anxiety   . Asthma   . Depression   . Diabetes mellitus without complication ([place])   . GERD (gastroesophageal reflux disease)   . IBS (irritable bowel syndrome)   . Migraine   . MRSA (methicillin resistant staph aureus) culture positive   . Oligomenorrhea   . Pyloric stenosis   . UTI (urinary tract infection)     Past Surgical History:  Past Surgical History:  Procedure Laterality Date  . COLPOSCOPY    . DILATION AND CURETTAGE OF UTERUS  age 59  . HYSTEROSCOPY  2015  . TONSILLECTOMY    . WISDOM TOOTH EXTRACTION      Gynecologic History: No LMP recorded. gg Obstetric History: [id number]  Family History:  Family History  Problem Relation Age of Onset  . Ovarian cancer Mother 62  . Other Father     Social History:  Social History   Socioeconomic History  . Marital status: Married    Spouse name: Dwayne  . Number of children: Not on file  . Years of education: Not on file  . Highest education level: Not on file  Occupational History  . Occupation: [employer]  Tobacco Use  . Smoking status: Former Research scientist (life sciences)  . Smokeless tobacco: Never Used  . Tobacco comment: smoked as teen  Vaping Use  . Vaping Use: Never used  Substance and Sexual Activity  . Alcohol use: Not Currently     Alcohol/week: 0.0 standard drinks    Comment: rarely  . Drug use: No  . Sexual activity: Yes    Partners: Male    Birth control/protection: Other-see comments    Comment: undecided   Other Topics Concern  . Not on file  Social History Narrative  . Not on file   Social Determinants of Health   Financial Resource Strain: Not on file  Food Insecurity: Not on file  Transportation Needs: Not on file  Physical Activity: Not on file  Stress: Not on file  Social Connections: Not on file  Intimate Partner Violence: Not on file    Allergies:  Allergies  Allergen Reactions  . Cefprozil Other (See Comments)  . Amoxil [Amoxicillin] Rash  . Augmentin [Amoxicillin-Pot Clavulanate] Rash    Medications: Prior to Admission medications   Medication Sig Start Date End Date Taking? Authorizing Provider  acetaminophen (TYLENOL) 325 MG tablet Take 2 tablets (650 mg total) by mouth every 4 (four) hours as needed (for pain scale < 4). 07/08/20   Veal, Katelyn, CNM  ADDERALL XR 20 MG 24 hr capsule Take 1 capsule (20 mg total) by mouth 2 (two) times daily. 06/17/20   Malachy Mood, MD  albuterol (PROVENTIL HFA;VENTOLIN HFA) 108 (90 Base) MCG/ACT inhaler Inhale 2 puffs into the lungs every 4 (four) hours as  needed for wheezing or shortness of breath. 05/01/17   Melynda Ripple, MD  Blood Glucose Monitoring Suppl (ACCU-CHEK AVIVA PLUS) w/Device KIT 1 Units by Does not apply route in the morning, at noon, in the evening, and at bedtime. 01/07/20   Malachy Mood, MD  clonazePAM (KLONOPIN) 0.5 MG tablet Take 1 tablet (0.5 mg total) by mouth 2 (two) times daily as needed for anxiety. TAKE 1/2 TABLET BY MOUTH EVERY MORNING, TAKE 1/2 TABLET BY MOUTH EVERY AFTERNOON & TAKE ONE TABLET BY MOUTH AT BEDTIME 06/17/20   Malachy Mood, MD  EPINEPHrine 0.3 mg/0.3 mL IJ SOAJ injection USE AS DIRECTED PER MD FOR ANAPHYLAXIS Patient not taking: No sig reported 06/09/19   [provider]  famotidine (PEPCID)  20 MG tablet Take 1 tablet (20 mg total) by mouth daily. 12/28/19 12/27/20  Malachy Mood, MD  Fluticasone-Salmeterol (ADVAIR) 250-50 MCG/DOSE AEPB Inhale 1 puff into the lungs daily. 12/28/19   Malachy Mood, MD  folic acid (FOLVITE) 1 MG tablet Take 1 tablet (1 mg total) by mouth daily. 01/25/20   Malachy Mood, MD  glipiZIDE (GLUCOTROL XL) 10 MG 24 hr tablet Take 1 tablet (10 mg total) by mouth daily with breakfast. 07/09/20   Orlie Pollen, CNM  glucose blood test strip Use as instructed 01/07/20   Malachy Mood, MD  ibuprofen (ADVIL) 600 MG tablet Take 1 tablet (600 mg total) by mouth every 6 (six) hours. 07/08/20   Orlie Pollen, CNM  metFORMIN (GLUCOPHAGE) 500 MG tablet Take 1 tablet (500 mg total) by mouth 4 (four) times daily. 07/08/20   Orlie Pollen, CNM  montelukast (SINGULAIR) 10 MG tablet Take 1 tablet (10 mg total) by mouth at bedtime. 12/28/19 12/27/20  Malachy Mood, MD  pantoprazole (PROTONIX) 40 MG tablet Take 1 tablet (40 mg total) by mouth daily. 12/28/19 12/27/20  Malachy Mood, MD  Prenatal Vit-Fe Fumarate-FA (PRENATAL MULTIVITAMIN) TABS tablet Take 1 tablet by mouth daily at 12 noon. 07/08/20   Orlie Pollen, CNM  sertraline (ZOLOFT) 50 MG tablet Take 1 tablet (50 mg total) by mouth daily. 12/28/19   Malachy Mood, MD  Spacer/Aero-Holding Chambers (AEROCHAMBER PLUS) inhaler Use as instructed 05/01/17   Melynda Ripple, MD  SYMBICORT 160-4.5 MCG/ACT inhaler Inhale 2 puffs into the lungs in the morning and at bedtime. 12/05/19   [provider]    Physical Exam Blood pressure 118/68, height $RemoveBefore'5\' 8"'pMqptorfVaCwZ$  (1.727 m), weight (!) 342 lb (155.1 kg), unknown if currently breastfeeding.  No LMP recorded.  General: NAD HEENT: normocephalic, anicteric Pulmonary: No increased work of breathing Neurologic: Grossly intact Psychiatric: mood appropriate, affect full  Assessment: 30 y.o. [id number] presenting for blood pressure evaluation today  Plan: Problem List Items  Addressed This Visit   None   Visit Diagnoses    BP check    -  Primary   Depression screening          1) Blood pressure - normotensive  2) DM2 - restarted oral treatment with PCP no longer on insuling  3) Breast feeding - some decreased milk production - reglan galactogog   4) Depression screening - some acute stressors with infant recently having surgery at [facility] for pyloric stenosis.  [place], her husband, and her first son also had pyloric stenosis as infants - EPDS of 10  5) Postpartum contraception - desires Mirena IUD  6) Return in about 2 weeks (around 08/22/2020) for 6 week postpartum and Mirena IUD.     Malachy Mood, MD, [facility], [facility] 08/08/2020, 3:18 PM

## 2020-08-08 NOTE — Telephone Encounter (Signed)
Patient coming in on 08/20/2020 at 10:10 with AMS in [place] for Mirena insertion

## 2020-08-11 NOTE — Telephone Encounter (Signed)
Noted. Will order to arrive by apt date/time. 

## 2020-08-15 ENCOUNTER — Other Ambulatory Visit: Payer: Self-pay | Admitting: Obstetrics and Gynecology

## 2020-08-15 DIAGNOSIS — J301 Allergic rhinitis due to pollen: Secondary | ICD-10-CM | POA: Diagnosis not present

## 2020-08-15 MED ORDER — METOCLOPRAMIDE HCL 10 MG PO TABS: 10.0000 mg | ORAL_TABLET | Freq: Three times a day (TID) | ORAL | 0 refills | Status: AC

## 2020-08-18 DIAGNOSIS — J301 Allergic rhinitis due to pollen: Secondary | ICD-10-CM | POA: Diagnosis not present

## 2020-08-20 ENCOUNTER — Ambulatory Visit: Payer: Medicaid Other | Admitting: Obstetrics and Gynecology

## 2020-08-28 ENCOUNTER — Other Ambulatory Visit: Payer: Self-pay

## 2020-08-28 ENCOUNTER — Ambulatory Visit (INDEPENDENT_AMBULATORY_CARE_PROVIDER_SITE_OTHER): Payer: Medicaid Other | Admitting: Obstetrics and Gynecology

## 2020-08-28 ENCOUNTER — Encounter: Payer: Self-pay | Admitting: Obstetrics and Gynecology

## 2020-08-28 DIAGNOSIS — F3181 Bipolar II disorder: Secondary | ICD-10-CM

## 2020-08-28 DIAGNOSIS — Z30430 Encounter for insertion of intrauterine contraceptive device: Secondary | ICD-10-CM

## 2020-08-28 DIAGNOSIS — F4310 Post-traumatic stress disorder, unspecified: Secondary | ICD-10-CM

## 2020-08-28 LAB — POCT URINE PREGNANCY: Preg Test, Ur: NEGATIVE

## 2020-08-28 NOTE — Progress Notes (Signed)
Postpartum Visit  Chief Complaint:  Chief Complaint  Patient presents with   Post-op Follow-up    6 wk post-op - IUD insertion. Procedure RM     History of Present Illness: Patient is a 30 y.o. [id number] presents for postpartum visit.  Date of delivery: 07/06/2020 Type of delivery: Vaginal delivery - Vacuum or forceps assisted  no Episiotomy No.  Laceration: no  Pregnancy or labor problems:  Yes pre-existing DM2 requiring insulin during pregnancy Any problems since the delivery:  Yes infant with pyloric stenosis  Newborn Details:  SINGLETON :  1. BabyGender female. Birth weight: 3780g Maternal Details:  Breast or formula feeding: plans to bottle feed Intercourse: No  Contraception after delivery: No  Any bowel or bladder issues: No  Post partum depression/anxiety noted:  yes Edinburgh Post-Partum Depression Score:17 Date of last PAP: 12/28/2019  no abnormalities   Review of Systems: Review of Systems  Constitutional: Negative.   Gastrointestinal: Negative.   Genitourinary: Negative.   Psychiatric/Behavioral: Positive for depression. Negative for suicidal ideas. The patient is nervous/anxious.     The following portions of the patient's history were reviewed and updated as appropriate: allergies, current medications, past family history, past medical history, past social history, past surgical history and problem list.  Past Medical History:  Past Medical History:  Diagnosis Date   ADD (attention deficit disorder)    Anxiety    Asthma    Depression    Diabetes mellitus without complication (HCC)    GERD (gastroesophageal reflux disease)    IBS (irritable bowel syndrome)    Migraine    MRSA (methicillin resistant staph aureus) culture positive    Oligomenorrhea    Pyloric stenosis    UTI (urinary tract infection)     Past Surgical History:  Past Surgical History:  Procedure Laterality Date   COLPOSCOPY     DILATION AND CURETTAGE OF UTERUS  age  56   HYSTEROSCOPY  15   TONSILLECTOMY     WISDOM TOOTH EXTRACTION      Family History:  Family History  Problem Relation Age of Onset   Ovarian cancer Mother 58   Other Father     Social History:  Social History   Socioeconomic History   Marital status: Married    Spouse name: Dwayne   Number of children: Not on file   Years of education: Not on file   Highest education level: Not on file  Occupational History   Occupation: [employer]  Tobacco Use   Smoking status: Former Smoker   Smokeless tobacco: Never Used   Tobacco comment: smoked as teen  Scientific laboratory technician Use: Never used  Substance and Sexual Activity   Alcohol use: Not Currently    Alcohol/week: 0.0 standard drinks    Comment: rarely   Drug use: No   Sexual activity: Yes    Partners: Male    Birth control/protection: Other-see comments    Comment: undecided   Other Topics Concern   Not on file  Social History Narrative   Not on file   Social Determinants of Health   Financial Resource Strain: Not on file  Food Insecurity: Not on file  Transportation Needs: Not on file  Physical Activity: Not on file  Stress: Not on file  Social Connections: Not on file  Intimate Partner Violence: Not on file    Allergies:  Allergies  Allergen Reactions   Cefprozil Other (See Comments)   Amoxil [Amoxicillin] Rash  Augmentin [Amoxicillin-Pot Clavulanate] Rash    Medications: Prior to Admission medications   Medication Sig Start Date End Date Taking? Authorizing Provider  acetaminophen (TYLENOL) 325 MG tablet Take 2 tablets (650 mg total) by mouth every 4 (four) hours as needed (for pain scale < 4). 07/08/20   Veal, Katelyn, CNM  ADDERALL XR 20 MG 24 hr capsule Take 1 capsule (20 mg total) by mouth 2 (two) times daily. 06/17/20   Malachy Mood, MD  albuterol (PROVENTIL HFA;VENTOLIN HFA) 108 (90 Base) MCG/ACT inhaler Inhale 2 puffs into the lungs every 4 (four) hours as needed for  wheezing or shortness of breath. 05/01/17   Melynda Ripple, MD  Blood Glucose Monitoring Suppl (ACCU-CHEK AVIVA PLUS) w/Device KIT 1 Units by Does not apply route in the morning, at noon, in the evening, and at bedtime. 01/07/20   Malachy Mood, MD  clonazePAM (KLONOPIN) 0.5 MG tablet Take 1 tablet (0.5 mg total) by mouth 2 (two) times daily as needed for anxiety. TAKE 1/2 TABLET BY MOUTH EVERY MORNING, TAKE 1/2 TABLET BY MOUTH EVERY AFTERNOON & TAKE ONE TABLET BY MOUTH AT BEDTIME 06/17/20   Malachy Mood, MD  EPINEPHrine 0.3 mg/0.3 mL IJ SOAJ injection USE AS DIRECTED PER MD FOR ANAPHYLAXIS Patient not taking: No sig reported 06/09/19   [provider]  famotidine (PEPCID) 20 MG tablet Take 1 tablet (20 mg total) by mouth daily. 12/28/19 12/27/20  Malachy Mood, MD  Fluticasone-Salmeterol (ADVAIR) 250-50 MCG/DOSE AEPB Inhale 1 puff into the lungs daily. 12/28/19   Malachy Mood, MD  folic acid (FOLVITE) 1 MG tablet Take 1 tablet (1 mg total) by mouth daily. 01/25/20   Malachy Mood, MD  glipiZIDE (GLUCOTROL XL) 10 MG 24 hr tablet Take 1 tablet (10 mg total) by mouth daily with breakfast. 07/09/20   Orlie Pollen, CNM  glucose blood test strip Use as instructed 01/07/20   Malachy Mood, MD  ibuprofen (ADVIL) 600 MG tablet Take 1 tablet (600 mg total) by mouth every 6 (six) hours. 07/08/20   Orlie Pollen, CNM  metFORMIN (GLUCOPHAGE) 500 MG tablet Take 1 tablet (500 mg total) by mouth 4 (four) times daily. 07/08/20   Orlie Pollen, CNM  metoCLOPramide (REGLAN) 10 MG tablet Take 1 tablet (10 mg total) by mouth 3 (three) times daily before meals. 08/15/20   Malachy Mood, MD  montelukast (SINGULAIR) 10 MG tablet Take 1 tablet (10 mg total) by mouth at bedtime. 12/28/19 12/27/20  Malachy Mood, MD  pantoprazole (PROTONIX) 40 MG tablet Take 1 tablet (40 mg total) by mouth daily. 12/28/19 12/27/20  Malachy Mood, MD  Prenatal Vit-Fe Fumarate-FA (PRENATAL MULTIVITAMIN) TABS tablet  Take 1 tablet by mouth daily at 12 noon. 07/08/20   Orlie Pollen, CNM  sertraline (ZOLOFT) 50 MG tablet Take 1 tablet (50 mg total) by mouth daily. 12/28/19   Malachy Mood, MD  Spacer/Aero-Holding Chambers (AEROCHAMBER PLUS) inhaler Use as instructed 05/01/17   Melynda Ripple, MD  SYMBICORT 160-4.5 MCG/ACT inhaler Inhale 2 puffs into the lungs in the morning and at bedtime. 12/05/19   [provider]    Physical Exam Blood pressure 110/73, pulse 91, height $RemoveBe'5\' 8"'gkxFbMHGj$  (1.727 m), weight (!) 343 lb 6 oz (155.8 kg), unknown if currently breastfeeding.    General: NAD HEENT: normocephalic, anicteric Pulmonary: No increased work of breathing Abdomen: NABS, soft, non-tender, non-distended.  Umbilicus without lesions.  No hepatomegaly, splenomegaly or masses palpable. No evidence of hernia. Genitourinary:  External: Normal external female genitalia.  Normal urethral meatus,  normal  Bartholin's and Skene's glands.    Vagina: Normal vaginal mucosa, no evidence of prolapse.    Cervix: Grossly normal in appearance, no bleeding  Uterus: Non-enlarged, mobile, normal contour.  No CMT  Adnexa: ovaries non-enlarged, no adnexal masses  Rectal: deferred Extremities: no edema, erythema, or tenderness Neurologic: Grossly intact Psychiatric: mood appropriate, affect full    GYNECOLOGY OFFICE PROCEDURE NOTE  Yvonne Rios is a 30 y.o. [phone] here for a Mirena IUD insertion. No GYN concerns.  The indication for her IUD is contraception/cycle control.  IUD Insertion Procedure Note Patient identified, informed consent performed, consent signed.   Discussed risks of irregular bleeding, cramping, infection, malpositioning, expulsion or uterine perforation of the IUD (1:1000 placements)  which may require further procedure such as laparoscopy.  IUD while effective at preventing pregnancy do not prevent transmission of sexually transmitted diseases and use of barrier methods for this purpose was  discussed. Time out was performed.  Urine pregnancy test negative.  Speculum placed in the vagina.  Cervix visualized.  Cleaned with Betadine x 2.  Grasped anteriorly with a single tooth tenaculum.  Uterus sounded to 8 cm. IUD placed per manufacturer's recommendations.  Strings trimmed to 3 cm. Tenaculum was removed, good hemostasis noted.  Patient tolerated procedure well.   Patient was given post-procedure instructions.  She was advised to have backup contraception for one week.  Patient was also asked to check IUD strings periodically and follow up in 6 weeks for IUD check.    Assessment: 30 y.o. [id number] presenting for 6 week postpartum visit  Plan: Problem List Items Addressed This Visit   None      1) Contraception - Education given regarding options for contraception, as well as compatibility with breast feeding if applicable.  Patient plans on IUD for contraception.  2)  Pap - ASCCP guidelines and rational discussed.  ASCCP guidelines and rational discussed.  Patient opts for every 3 years screening interval  3) Patient underwent screening for postpartum depression with concern for depression, referral initiated - referal psychiaty EPDS of 17, history PTSD and bipolar depression - for now continue Zoloft $RemoveBeforeD'50mg'FYYoHgNrGYfRvO$  - Some situational stressors with infant having had corrective surgery for pyloric stenosis but doing well per [place]  4) No follow-ups on file.   Malachy Mood, MD, [facility], [facility] 08/28/2020, 10:05 AM

## 2020-09-05 DIAGNOSIS — Z419 Encounter for procedure for purposes other than remedying health state, unspecified: Secondary | ICD-10-CM | POA: Diagnosis not present

## 2020-09-09 DIAGNOSIS — F419 Anxiety disorder, unspecified: Secondary | ICD-10-CM | POA: Diagnosis not present

## 2020-09-09 DIAGNOSIS — F53 Postpartum depression: Secondary | ICD-10-CM | POA: Diagnosis not present

## 2020-09-09 DIAGNOSIS — O99345 Other mental disorders complicating the puerperium: Secondary | ICD-10-CM | POA: Diagnosis not present

## 2020-09-09 DIAGNOSIS — R600 Localized edema: Secondary | ICD-10-CM | POA: Diagnosis not present

## 2020-09-09 DIAGNOSIS — G43809 Other migraine, not intractable, without status migrainosus: Secondary | ICD-10-CM | POA: Diagnosis not present

## 2020-09-09 DIAGNOSIS — Z794 Long term (current) use of insulin: Secondary | ICD-10-CM | POA: Diagnosis not present

## 2020-09-09 DIAGNOSIS — E119 Type 2 diabetes mellitus without complications: Secondary | ICD-10-CM | POA: Diagnosis not present

## 2020-09-15 DIAGNOSIS — F901 Attention-deficit hyperactivity disorder, predominantly hyperactive type: Secondary | ICD-10-CM | POA: Diagnosis not present

## 2020-09-15 DIAGNOSIS — F32A Depression, unspecified: Secondary | ICD-10-CM | POA: Diagnosis not present

## 2020-09-15 DIAGNOSIS — F410 Panic disorder [episodic paroxysmal anxiety] without agoraphobia: Secondary | ICD-10-CM | POA: Diagnosis not present

## 2020-09-15 DIAGNOSIS — F411 Generalized anxiety disorder: Secondary | ICD-10-CM | POA: Diagnosis not present

## 2020-09-15 DIAGNOSIS — Z5181 Encounter for therapeutic drug level monitoring: Secondary | ICD-10-CM | POA: Diagnosis not present

## 2020-09-17 DIAGNOSIS — E119 Type 2 diabetes mellitus without complications: Secondary | ICD-10-CM | POA: Diagnosis not present

## 2020-09-17 DIAGNOSIS — H43393 Other vitreous opacities, bilateral: Secondary | ICD-10-CM | POA: Diagnosis not present

## 2020-09-18 DIAGNOSIS — F900 Attention-deficit hyperactivity disorder, predominantly inattentive type: Secondary | ICD-10-CM | POA: Diagnosis not present

## 2020-09-30 DIAGNOSIS — F411 Generalized anxiety disorder: Secondary | ICD-10-CM | POA: Diagnosis not present

## 2020-09-30 DIAGNOSIS — F410 Panic disorder [episodic paroxysmal anxiety] without agoraphobia: Secondary | ICD-10-CM | POA: Diagnosis not present

## 2020-09-30 DIAGNOSIS — F32A Depression, unspecified: Secondary | ICD-10-CM | POA: Diagnosis not present

## 2020-09-30 DIAGNOSIS — F901 Attention-deficit hyperactivity disorder, predominantly hyperactive type: Secondary | ICD-10-CM | POA: Diagnosis not present

## 2020-10-05 DIAGNOSIS — Z419 Encounter for procedure for purposes other than remedying health state, unspecified: Secondary | ICD-10-CM | POA: Diagnosis not present

## 2020-10-16 ENCOUNTER — Ambulatory Visit (INDEPENDENT_AMBULATORY_CARE_PROVIDER_SITE_OTHER): Payer: Medicaid Other | Admitting: Obstetrics and Gynecology

## 2020-10-16 ENCOUNTER — Encounter: Payer: Self-pay | Admitting: Obstetrics and Gynecology

## 2020-10-16 ENCOUNTER — Other Ambulatory Visit: Payer: Self-pay

## 2020-10-16 VITALS — BP 120/77 | Wt 342.0 lb

## 2020-10-16 DIAGNOSIS — Z30431 Encounter for routine checking of intrauterine contraceptive device: Secondary | ICD-10-CM | POA: Diagnosis not present

## 2020-10-17 NOTE — Progress Notes (Signed)
Obstetrics & Gynecology Office Visit   Chief Complaint:  Chief Complaint  Patient presents with  . Follow-up    IUD string check - no concerns. Procedure Rm    History of Present Illness: 30 y.o. patient presenting for follow up of Mirena IUD placement 6+ weeks ago.  The indication for her IUD was contraception.  She denies any complications since her IUD placement.  Still having some occasional spotting.  is able to feel strings.    Review of Systems: Review of Systems  Gastrointestinal: Negative.   Genitourinary: Negative.     Past Medical History:  Past Medical History:  Diagnosis Date  . ADD (attention deficit disorder)   . Anxiety   . Asthma   . Depression   . Diabetes mellitus without complication ([place])   . GERD (gastroesophageal reflux disease)   . IBS (irritable bowel syndrome)   . Migraine   . MRSA (methicillin resistant staph aureus) culture positive   . Oligomenorrhea   . Pyloric stenosis   . UTI (urinary tract infection)     Past Surgical History:  Past Surgical History:  Procedure Laterality Date  . COLPOSCOPY    . DILATION AND CURETTAGE OF UTERUS  age 84  . HYSTEROSCOPY  2015  . TONSILLECTOMY    . WISDOM TOOTH EXTRACTION      Gynecologic History: No LMP recorded.  Obstetric History: [id number]  Family History:  Family History  Problem Relation Age of Onset  . Ovarian cancer Mother 79  . Other Father     Social History:  Social History   Socioeconomic History  . Marital status: Married    Spouse name: Dwayne  . Number of children: Not on file  . Years of education: Not on file  . Highest education level: Not on file  Occupational History  . Occupation: [employer]  Tobacco Use  . Smoking status: Former Research scientist (life sciences)  . Smokeless tobacco: Never Used  . Tobacco comment: smoked as teen  Vaping Use  . Vaping Use: Never used  Substance and Sexual Activity  . Alcohol use: Not Currently    Alcohol/week: 0.0 standard drinks    Comment: rarely   . Drug use: No  . Sexual activity: Yes    Partners: Male    Birth control/protection: I.U.D.    Comment: undecided   Other Topics Concern  . Not on file  Social History Narrative  . Not on file   Social Determinants of Health   Financial Resource Strain: Not on file  Food Insecurity: Not on file  Transportation Needs: Not on file  Physical Activity: Not on file  Stress: Not on file  Social Connections: Not on file  Intimate Partner Violence: Not on file    Allergies:  Allergies  Allergen Reactions  . Cefprozil Other (See Comments)  . Amoxil [Amoxicillin] Rash  . Augmentin [Amoxicillin-Pot Clavulanate] Rash    Medications: Prior to Admission medications   Medication Sig Start Date End Date Taking? Authorizing Provider  acetaminophen (TYLENOL) 325 MG tablet Take 2 tablets (650 mg total) by mouth every 4 (four) hours as needed (for pain scale < 4). 07/08/20   Veal, Katelyn, CNM  ADDERALL XR 20 MG 24 hr capsule Take 1 capsule (20 mg total) by mouth 2 (two) times daily. 06/17/20   Malachy Mood, MD  albuterol (PROVENTIL HFA;VENTOLIN HFA) 108 (90 Base) MCG/ACT inhaler Inhale 2 puffs into the lungs every 4 (four) hours as needed for wheezing or shortness of breath.  05/01/17   Melynda Ripple, MD  Blood Glucose Monitoring Suppl (ACCU-CHEK AVIVA PLUS) w/Device KIT 1 Units by Does not apply route in the morning, at noon, in the evening, and at bedtime. 01/07/20   Malachy Mood, MD  clonazePAM (KLONOPIN) 0.5 MG tablet Take 1 tablet (0.5 mg total) by mouth 2 (two) times daily as needed for anxiety. TAKE 1/2 TABLET BY MOUTH EVERY MORNING, TAKE 1/2 TABLET BY MOUTH EVERY AFTERNOON & TAKE ONE TABLET BY MOUTH AT BEDTIME 06/17/20   Malachy Mood, MD  EPINEPHrine 0.3 mg/0.3 mL IJ SOAJ injection USE AS DIRECTED PER MD FOR ANAPHYLAXIS Patient not taking: No sig reported 06/09/19   [provider]  famotidine (PEPCID) 20 MG tablet Take 1 tablet (20 mg total) by mouth daily. 12/28/19  12/27/20  Malachy Mood, MD  Fluticasone-Salmeterol (ADVAIR) 250-50 MCG/DOSE AEPB Inhale 1 puff into the lungs daily. 12/28/19   Malachy Mood, MD  folic acid (FOLVITE) 1 MG tablet Take 1 tablet (1 mg total) by mouth daily. 01/25/20   Malachy Mood, MD  glipiZIDE (GLUCOTROL XL) 10 MG 24 hr tablet Take 1 tablet (10 mg total) by mouth daily with breakfast. 07/09/20   Orlie Pollen, CNM  glucose blood test strip Use as instructed 01/07/20   Malachy Mood, MD  ibuprofen (ADVIL) 600 MG tablet Take 1 tablet (600 mg total) by mouth every 6 (six) hours. 07/08/20   Orlie Pollen, CNM  metFORMIN (GLUCOPHAGE) 500 MG tablet Take 1 tablet (500 mg total) by mouth 4 (four) times daily. 07/08/20   Orlie Pollen, CNM  metoCLOPramide (REGLAN) 10 MG tablet Take 1 tablet (10 mg total) by mouth 3 (three) times daily before meals. 08/15/20   Malachy Mood, MD  montelukast (SINGULAIR) 10 MG tablet Take 1 tablet (10 mg total) by mouth at bedtime. 12/28/19 12/27/20  Malachy Mood, MD  pantoprazole (PROTONIX) 40 MG tablet Take 1 tablet (40 mg total) by mouth daily. 12/28/19 12/27/20  Malachy Mood, MD  Prenatal Vit-Fe Fumarate-FA (PRENATAL MULTIVITAMIN) TABS tablet Take 1 tablet by mouth daily at 12 noon. 07/08/20   Orlie Pollen, CNM  sertraline (ZOLOFT) 50 MG tablet Take 1 tablet (50 mg total) by mouth daily. 12/28/19   Malachy Mood, MD  Spacer/Aero-Holding Chambers (AEROCHAMBER PLUS) inhaler Use as instructed 05/01/17   Melynda Ripple, MD  SYMBICORT 160-4.5 MCG/ACT inhaler Inhale 2 puffs into the lungs in the morning and at bedtime. 12/05/19   [provider]    Physical Exam Blood pressure 120/77, weight (!) 342 lb (155.1 kg), unknown if currently breastfeeding. No LMP recorded.  General: NAD HEENT: normocephalic, anicteric Pulmonary: No increased work of breathing  Genitourinary:  External: Normal external female genitalia.  Normal urethral meatus, normal  Bartholin's and Skene's glands.     Vagina: Normal vaginal mucosa, no evidence of prolapse.    Cervix: Grossly normal in appearance, no bleeding, IUD strings visualized 2cm  Uterus: Non-enlarged, mobile, normal contour.  No CMT  Adnexa: ovaries non-enlarged, no adnexal masses  Rectal: deferred  Lymphatic: no evidence of inguinal lymphadenopathy Extremities: no edema, erythema, or tenderness Neurologic: Grossly intact Psychiatric: mood appropriate, affect full  Female chaperone present for pelvic and breast  portions of the physical exam  Assessment: 30 y.o. [id number] medication follow up Plan: Problem List Items Addressed This Visit   None   Visit Diagnoses    IUD check up    -  Primary       1.  The patient was given instructions to check her  IUD strings monthly and call with any problems or concerns.  She should call for fevers, chills, abnormal vaginal discharge, pelvic pain, or other complaints.  2.   IUDs while effective at preventing pregnancy do not prevent transmission of sexually transmitted diseases and use of barrier methods for this purpose was discussed.  Low overall incidence of failure with 99.7% efficacy rate in typical use.  The patient has not contraindication to IUD placement.  3.  She will return for a annual exam in 1 year.  All questions answered.  4) A total of 15 minutes were spent in face-to-face contact with the patient during this encounter with over half of that time devoted to counseling and coordination of care.  5) Now established at [employer] with working diagnosis of Bipolar and PTSD  6)  Return in about 1 year (around 10/16/2021) for annual.   Malachy Mood, MD, [facility], [place]

## 2020-10-20 DIAGNOSIS — F32A Depression, unspecified: Secondary | ICD-10-CM | POA: Diagnosis not present

## 2020-10-20 DIAGNOSIS — F901 Attention-deficit hyperactivity disorder, predominantly hyperactive type: Secondary | ICD-10-CM | POA: Diagnosis not present

## 2020-10-20 DIAGNOSIS — F410 Panic disorder [episodic paroxysmal anxiety] without agoraphobia: Secondary | ICD-10-CM | POA: Diagnosis not present

## 2020-10-20 DIAGNOSIS — F411 Generalized anxiety disorder: Secondary | ICD-10-CM | POA: Diagnosis not present

## 2020-10-21 DIAGNOSIS — E119 Type 2 diabetes mellitus without complications: Secondary | ICD-10-CM | POA: Diagnosis not present

## 2020-10-21 DIAGNOSIS — L089 Local infection of the skin and subcutaneous tissue, unspecified: Secondary | ICD-10-CM | POA: Diagnosis not present

## 2020-10-21 DIAGNOSIS — M25561 Pain in right knee: Secondary | ICD-10-CM | POA: Diagnosis not present

## 2020-10-21 DIAGNOSIS — R600 Localized edema: Secondary | ICD-10-CM | POA: Diagnosis not present

## 2020-10-21 DIAGNOSIS — H6121 Impacted cerumen, right ear: Secondary | ICD-10-CM | POA: Diagnosis not present

## 2020-10-21 DIAGNOSIS — G43109 Migraine with aura, not intractable, without status migrainosus: Secondary | ICD-10-CM | POA: Diagnosis not present

## 2020-10-21 DIAGNOSIS — F3161 Bipolar disorder, current episode mixed, mild: Secondary | ICD-10-CM | POA: Diagnosis not present

## 2020-10-24 DIAGNOSIS — M25561 Pain in right knee: Secondary | ICD-10-CM | POA: Diagnosis not present

## 2020-10-24 DIAGNOSIS — M7989 Other specified soft tissue disorders: Secondary | ICD-10-CM | POA: Diagnosis not present

## 2020-11-05 DIAGNOSIS — Z419 Encounter for procedure for purposes other than remedying health state, unspecified: Secondary | ICD-10-CM | POA: Diagnosis not present

## 2020-11-11 DIAGNOSIS — J301 Allergic rhinitis due to pollen: Secondary | ICD-10-CM | POA: Diagnosis not present

## 2020-11-11 DIAGNOSIS — T7809XA Anaphylactic reaction due to other food products, initial encounter: Secondary | ICD-10-CM | POA: Diagnosis not present

## 2020-11-13 DIAGNOSIS — J305 Allergic rhinitis due to food: Secondary | ICD-10-CM | POA: Diagnosis not present

## 2020-11-14 DIAGNOSIS — J301 Allergic rhinitis due to pollen: Secondary | ICD-10-CM | POA: Diagnosis not present

## 2020-11-17 DIAGNOSIS — F32A Depression, unspecified: Secondary | ICD-10-CM | POA: Diagnosis not present

## 2020-11-17 DIAGNOSIS — F901 Attention-deficit hyperactivity disorder, predominantly hyperactive type: Secondary | ICD-10-CM | POA: Diagnosis not present

## 2020-11-17 DIAGNOSIS — F410 Panic disorder [episodic paroxysmal anxiety] without agoraphobia: Secondary | ICD-10-CM | POA: Diagnosis not present

## 2020-11-17 DIAGNOSIS — F411 Generalized anxiety disorder: Secondary | ICD-10-CM | POA: Diagnosis not present

## 2020-11-24 DIAGNOSIS — J301 Allergic rhinitis due to pollen: Secondary | ICD-10-CM | POA: Diagnosis not present

## 2020-12-05 DIAGNOSIS — Z419 Encounter for procedure for purposes other than remedying health state, unspecified: Secondary | ICD-10-CM | POA: Diagnosis not present

## 2020-12-25 ENCOUNTER — Encounter: Payer: Medicaid Other | Admitting: Podiatry

## 2020-12-29 DIAGNOSIS — H60393 Other infective otitis externa, bilateral: Secondary | ICD-10-CM | POA: Diagnosis not present

## 2020-12-29 DIAGNOSIS — Z794 Long term (current) use of insulin: Secondary | ICD-10-CM | POA: Diagnosis not present

## 2020-12-29 DIAGNOSIS — E119 Type 2 diabetes mellitus without complications: Secondary | ICD-10-CM | POA: Diagnosis not present

## 2021-01-05 DIAGNOSIS — Z419 Encounter for procedure for purposes other than remedying health state, unspecified: Secondary | ICD-10-CM | POA: Diagnosis not present

## 2021-01-12 DIAGNOSIS — F901 Attention-deficit hyperactivity disorder, predominantly hyperactive type: Secondary | ICD-10-CM | POA: Diagnosis not present

## 2021-01-12 DIAGNOSIS — F4312 Post-traumatic stress disorder, chronic: Secondary | ICD-10-CM | POA: Diagnosis not present

## 2021-01-12 DIAGNOSIS — F410 Panic disorder [episodic paroxysmal anxiety] without agoraphobia: Secondary | ICD-10-CM | POA: Diagnosis not present

## 2021-01-12 DIAGNOSIS — F411 Generalized anxiety disorder: Secondary | ICD-10-CM | POA: Diagnosis not present

## 2021-01-12 DIAGNOSIS — F3162 Bipolar disorder, current episode mixed, moderate: Secondary | ICD-10-CM | POA: Diagnosis not present

## 2021-02-05 DIAGNOSIS — Z419 Encounter for procedure for purposes other than remedying health state, unspecified: Secondary | ICD-10-CM | POA: Diagnosis not present

## 2021-02-06 DIAGNOSIS — J301 Allergic rhinitis due to pollen: Secondary | ICD-10-CM | POA: Diagnosis not present

## 2021-02-11 DIAGNOSIS — J301 Allergic rhinitis due to pollen: Secondary | ICD-10-CM | POA: Diagnosis not present

## 2021-02-17 DIAGNOSIS — F3162 Bipolar disorder, current episode mixed, moderate: Secondary | ICD-10-CM | POA: Diagnosis not present

## 2021-02-17 DIAGNOSIS — F901 Attention-deficit hyperactivity disorder, predominantly hyperactive type: Secondary | ICD-10-CM | POA: Diagnosis not present

## 2021-02-17 DIAGNOSIS — F410 Panic disorder [episodic paroxysmal anxiety] without agoraphobia: Secondary | ICD-10-CM | POA: Diagnosis not present

## 2021-02-17 DIAGNOSIS — F411 Generalized anxiety disorder: Secondary | ICD-10-CM | POA: Diagnosis not present

## 2021-02-17 DIAGNOSIS — F4312 Post-traumatic stress disorder, chronic: Secondary | ICD-10-CM | POA: Diagnosis not present

## 2021-03-07 DIAGNOSIS — Z419 Encounter for procedure for purposes other than remedying health state, unspecified: Secondary | ICD-10-CM | POA: Diagnosis not present

## 2021-03-12 ENCOUNTER — Ambulatory Visit
Admission: EM | Admit: 2021-03-12 | Discharge: 2021-03-12 | Disposition: A | Discharge status: Home / Self Care | Payer: Medicaid Other | Attending: Physician Assistant | Admitting: Physician Assistant

## 2021-03-12 DIAGNOSIS — J019 Acute sinusitis, unspecified: Secondary | ICD-10-CM | POA: Diagnosis not present

## 2021-03-12 DIAGNOSIS — J45901 Unspecified asthma with (acute) exacerbation: Secondary | ICD-10-CM | POA: Diagnosis not present

## 2021-03-12 DIAGNOSIS — R051 Acute cough: Secondary | ICD-10-CM | POA: Diagnosis not present

## 2021-03-12 MED ORDER — PREDNISONE 20 MG PO TABS: 40.0000 mg | ORAL_TABLET | Freq: Every day | ORAL | 0 refills | Status: AC

## 2021-03-12 MED ORDER — DOXYCYCLINE HYCLATE 100 MG PO CAPS: 100.0000 mg | ORAL_CAPSULE | Freq: Two times a day (BID) | ORAL | 0 refills | Status: AC

## 2021-03-12 NOTE — ED Provider Notes (Signed)
[facility]    CSN: [id number] Arrival date & time: 03/12/21  1541      History   Chief Complaint Chief Complaint  Patient presents with   Cough    HPI Yvonne LORENSEN is a 30 y.o. female presenting for 2-week history of productive cough, yellowish-green nasal congestion, sinus pressure, bilateral ear pain/pressure, sore throat and voice hoarseness.  Also admits to chest tightness and shortness of breath.  Patient says symptoms have gotten worse and not better despite using her inhalers at home for asthma and using nasal sprays, Sudafed and Mucinex.  Patient denies any sick contacts and no known exposure to COVID-19.  Patient did have a baby earlier this year but is no longer breast-feeding.  She has no other complaints.  HPI  Past Medical History:  Diagnosis Date   ADD (attention deficit disorder)    Anxiety    Asthma    Depression    Diabetes mellitus without complication (HCC)    GERD (gastroesophageal reflux disease)    IBS (irritable bowel syndrome)    Migraine    MRSA (methicillin resistant staph aureus) culture positive    Oligomenorrhea    Pyloric stenosis    UTI (urinary tract infection)     Patient Active Problem List   Diagnosis Date Noted   Encounter for postpartum care and examination after delivery 07/08/2020   Care and examination of lactating mother 07/08/2020   Uncontrolled type 2 diabetes mellitus with hyperglycemia ([place]) 04/04/2020   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   Attention deficit disorder (ADD) in adult 09/28/2019   Myopia, left eye 12/07/2017   Migraine without status migrainosus, not intractable 11/19/2015   Morbid obesity due to excess calories ([place]) 11/19/2015   Environmental allergies 03/17/2015   Asthma 11/21/2014   Mild intermittent asthma without complication 17/49/4496   Left-sided low back pain with sciatica 08/09/2014   Insomnia 11/20/2013   Functional constipation 07/03/2013   GERD (gastroesophageal reflux  disease) 07/03/2013   Anxiety 05/16/2013   Depression 05/16/2013    Past Surgical History:  Procedure Laterality Date   COLPOSCOPY     DILATION AND CURETTAGE OF UTERUS  age 32   HYSTEROSCOPY  58   TONSILLECTOMY     WISDOM TOOTH EXTRACTION      OB History     Gravida  4   Para  2   Term  2   Preterm      AB  2   Living  2      SAB  2   IAB      Ectopic      Multiple  0   Live Births  2            Home Medications    Prior to Admission medications   Medication Sig Start Date End Date Taking? Authorizing Provider  ADDERALL XR 20 MG 24 hr capsule Take 1 capsule (20 mg total) by mouth 2 (two) times daily. 06/17/20  Yes Malachy Mood, MD  albuterol (PROVENTIL HFA;VENTOLIN HFA) 108 (90 Base) MCG/ACT inhaler Inhale 2 puffs into the lungs every 4 (four) hours as needed for wheezing or shortness of breath. 05/01/17  Yes Melynda Ripple, MD  Blood Glucose Monitoring Suppl (ACCU-CHEK AVIVA PLUS) w/Device KIT 1 Units by Does not apply route in the morning, at noon, in the evening, and at bedtime. 01/07/20  Yes Malachy Mood, MD  clonazePAM (KLONOPIN) 0.5 MG tablet Take 1 tablet (0.5 mg total) by mouth 2 (two) times daily  as needed for anxiety. TAKE 1/2 TABLET BY MOUTH EVERY MORNING, TAKE 1/2 TABLET BY MOUTH EVERY AFTERNOON & TAKE ONE TABLET BY MOUTH AT BEDTIME 06/17/20  Yes Malachy Mood, MD  doxycycline (VIBRAMYCIN) 100 MG capsule Take 1 capsule (100 mg total) by mouth 2 (two) times daily for 7 days. 03/12/21 03/19/21 Yes Laurene Footman B, PA-C  EPINEPHrine 0.3 mg/0.3 mL IJ SOAJ injection USE AS DIRECTED PER MD FOR ANAPHYLAXIS 06/09/19  Yes [provider]  famotidine (PEPCID) 20 MG tablet Take 1 tablet (20 mg total) by mouth daily. 12/28/19 03/12/21 Yes Malachy Mood, MD  Fluticasone-Salmeterol (ADVAIR) 250-50 MCG/DOSE AEPB Inhale 1 puff into the lungs daily. 12/28/19  Yes Malachy Mood, MD  folic acid (FOLVITE) 1 MG tablet Take 1 tablet (1 mg  total) by mouth daily. 01/25/20  Yes Malachy Mood, MD  glipiZIDE (GLUCOTROL XL) 10 MG 24 hr tablet Take 1 tablet (10 mg total) by mouth daily with breakfast. 07/09/20  Yes Veal, Katelyn, CNM  glucose blood test strip Use as instructed 01/07/20  Yes Malachy Mood, MD  ibuprofen (ADVIL) 600 MG tablet Take 1 tablet (600 mg total) by mouth every 6 (six) hours. 07/08/20  Yes Veal, Katelyn, CNM  metoCLOPramide (REGLAN) 10 MG tablet Take 1 tablet (10 mg total) by mouth 3 (three) times daily before meals. 08/15/20  Yes Malachy Mood, MD  predniSONE (DELTASONE) 20 MG tablet Take 2 tablets (40 mg total) by mouth daily for 5 days. 03/12/21 03/17/21 Yes Danton Clap, PA-C  Prenatal Vit-Fe Fumarate-FA (PRENATAL MULTIVITAMIN) TABS tablet Take 1 tablet by mouth daily at 12 noon. 07/08/20  Yes Orlie Pollen, CNM  SYMBICORT 160-4.5 MCG/ACT inhaler Inhale 2 puffs into the lungs in the morning and at bedtime. 12/05/19  Yes [provider]  acetaminophen (TYLENOL) 325 MG tablet Take 2 tablets (650 mg total) by mouth every 4 (four) hours as needed (for pain scale < 4). 07/08/20   Orlie Pollen, CNM  metFORMIN (GLUCOPHAGE) 500 MG tablet Take 1 tablet (500 mg total) by mouth 4 (four) times daily. 07/08/20   Orlie Pollen, CNM  montelukast (SINGULAIR) 10 MG tablet Take 1 tablet (10 mg total) by mouth at bedtime. 12/28/19 12/27/20  Malachy Mood, MD  pantoprazole (PROTONIX) 40 MG tablet Take 1 tablet (40 mg total) by mouth daily. 12/28/19 12/27/20  Malachy Mood, MD  sertraline (ZOLOFT) 50 MG tablet Take 1 tablet (50 mg total) by mouth daily. 12/28/19   Malachy Mood, MD  Spacer/Aero-Holding Chambers (AEROCHAMBER PLUS) inhaler Use as instructed 05/01/17   Melynda Ripple, MD    Family History Family History  Problem Relation Age of Onset   Ovarian cancer Mother 41   Other Father     Social History Social History   Tobacco Use   Smoking status: Former   Smokeless tobacco: Never   Tobacco  comments:    smoked as teen  Scientific laboratory technician Use: Never used  Substance Use Topics   Alcohol use: Not Currently    Alcohol/week: 0.0 standard drinks    Comment: rarely   Drug use: No     Allergies   Cefprozil, Amoxil [amoxicillin], and Augmentin [amoxicillin-pot clavulanate]   Review of Systems Review of Systems  Constitutional:  Positive for fatigue. Negative for chills, diaphoresis and fever.  HENT:  Positive for congestion, ear pain, rhinorrhea, sinus pressure, sore throat and voice change.   Respiratory:  Positive for cough, chest tightness and shortness of breath.   Gastrointestinal:  Negative for  abdominal pain, nausea and vomiting.  Musculoskeletal:  Negative for arthralgias and myalgias.  Skin:  Negative for rash.  Neurological:  Negative for weakness and headaches.  Hematological:  Negative for adenopathy.    Physical Exam Triage Vital Signs ED Triage Vitals  Enc Vitals Group     BP 03/12/21 1554 127/73     Pulse Rate 03/12/21 1554 81     Resp 03/12/21 1554 20     Temp 03/12/21 1554 98.5 F (36.9 C)     Temp Source 03/12/21 1554 Oral     SpO2 03/12/21 1554 99 %     Weight 03/12/21 1552 300 lb (136.1 kg)     Height 03/12/21 1552 $RemoveBefor'5\' 8"'LLOmufefmDba$  (1.727 m)     Head Circumference --      Peak Flow --      Pain Score 03/12/21 1552 7     Pain Loc --      Pain Edu? --      Excl. in [place]? --    No data found.  Updated Vital Signs BP 127/73 (BP Location: Left Arm)   Pulse 81   Temp 98.5 F (36.9 C) (Oral)   Resp 20   Ht $R'5\' 8"'OE$  (1.727 m)   Wt 300 lb (136.1 kg)   LMP 03/09/2021   SpO2 99%   BMI 45.61 kg/m      Physical Exam Vitals and nursing note reviewed.  Constitutional:      General: She is not in acute distress.    Appearance: Normal appearance. She is obese. She is not ill-appearing or toxic-appearing.  HENT:     Head: Normocephalic and atraumatic.     Right Ear: Ear canal and external ear normal. A middle ear effusion is present.     Left Ear: Ear  canal and external ear normal. A middle ear effusion is present. Tympanic membrane is erythematous.     Nose: Congestion present.     Mouth/Throat:     Mouth: Mucous membranes are moist.     Pharynx: Oropharynx is clear. Posterior oropharyngeal erythema present.  Eyes:     General: No scleral icterus.       Right eye: No discharge.        Left eye: No discharge.     Conjunctiva/sclera: Conjunctivae normal.  Cardiovascular:     Rate and Rhythm: Normal rate and regular rhythm.     Heart sounds: Normal heart sounds.  Pulmonary:     Effort: Pulmonary effort is normal. No respiratory distress.     Breath sounds: Normal breath sounds. No wheezing, rhonchi or rales.     Comments: No abnormal sounds but seems to have mild difficulty moving air Musculoskeletal:     Cervical back: Neck supple.  Skin:    General: Skin is dry.  Neurological:     General: No focal deficit present.     Mental Status: She is alert. Mental status is at baseline.     Motor: No weakness.     Gait: Gait normal.  Psychiatric:        Mood and Affect: Mood normal.        Behavior: Behavior normal.        Thought Content: Thought content normal.     UC Treatments / Results  Labs (all labs ordered are listed, but only abnormal results are displayed) Labs Reviewed - No data to display  EKG   Radiology No results found.  Procedures Procedures (including critical care time)  Medications  Ordered in UC Medications - No data to display  Initial Impression / Assessment and Plan / UC Course  I have reviewed the triage vital signs and the nursing notes.  Pertinent labs & imaging results that were available during my care of the patient were reviewed by me and considered in my medical decision making (see chart for details).  30 year old female presenting for 2-week history of worsening discolored nasal congestion, sinus pressure, ear pain, productive cough with chest tightness and shortness of breath.  Vitals  all normal and stable.  She is overall well-appearing.  Does have voice hoarseness on exam.  Significant nasal congestion noted on exam and effusions of both TMs with erythema of the left TM.  Chest is clear to auscultation but seems to be having mild difficulty moving air.  Treating patient for sinus infection at this time with doxycycline given her penicillin and cephalosporin allergies.  I have also sent in prednisone.  Advised to continue with the oral decongestants and nasal sprays.  Advised to continue with her at home breathing treatments.  Reviewed return and ED precautions.   Final Clinical Impressions(s) / UC Diagnoses   Final diagnoses:  Acute sinusitis, recurrence not specified, unspecified location  Acute cough  Exacerbation of asthma, unspecified asthma severity, unspecified whether persistent     Discharge Instructions      -You have a sinus infection.  Have sent antibiotics.   -You are also having an asthma exacerbation so I sent prednisone.  Continue with your at home breathing treatments as needed for shortness of breath. -Continue decongestants and nasal sprays. -You should get better over the next 7 to 10 days but if you do not or if you have worsening symptoms you should be seen again.     ED Prescriptions     Medication Sig Dispense Auth. Provider   doxycycline (VIBRAMYCIN) 100 MG capsule Take 1 capsule (100 mg total) by mouth 2 (two) times daily for 7 days. 14 capsule Laurene Footman B, PA-C   predniSONE (DELTASONE) 20 MG tablet Take 2 tablets (40 mg total) by mouth daily for 5 days. 10 tablet Gretta Cool      PDMP not reviewed this encounter.   Danton Clap, PA-C 03/12/21 1624

## 2021-03-12 NOTE — Discharge Instructions (Signed)
-  You have a sinus infection.  Have sent antibiotics.   -You are also having an asthma exacerbation so I sent prednisone.  Continue with your at home breathing treatments as needed for shortness of breath. -Continue decongestants and nasal sprays. -You should get better over the next 7 to 10 days but if you do not or if you have worsening symptoms you should be seen again.

## 2021-03-12 NOTE — ED Triage Notes (Signed)
Pt here with C/O Chest congestion, cough, sore throat, and ear pain for 2 weeks. Has been treating at home with mucinex, sudafed, with no relief.

## 2021-03-20 DIAGNOSIS — F321 Major depressive disorder, single episode, moderate: Secondary | ICD-10-CM | POA: Diagnosis not present

## 2021-03-21 DIAGNOSIS — F321 Major depressive disorder, single episode, moderate: Secondary | ICD-10-CM | POA: Diagnosis not present

## 2021-03-27 DIAGNOSIS — F321 Major depressive disorder, single episode, moderate: Secondary | ICD-10-CM | POA: Diagnosis not present

## 2021-03-28 DIAGNOSIS — F321 Major depressive disorder, single episode, moderate: Secondary | ICD-10-CM | POA: Diagnosis not present

## 2021-03-31 DIAGNOSIS — Z23 Encounter for immunization: Secondary | ICD-10-CM | POA: Diagnosis not present

## 2021-03-31 DIAGNOSIS — E119 Type 2 diabetes mellitus without complications: Secondary | ICD-10-CM | POA: Diagnosis not present

## 2021-03-31 DIAGNOSIS — J4540 Moderate persistent asthma, uncomplicated: Secondary | ICD-10-CM | POA: Diagnosis not present

## 2021-03-31 DIAGNOSIS — T300 Burn of unspecified body region, unspecified degree: Secondary | ICD-10-CM | POA: Diagnosis not present

## 2021-03-31 DIAGNOSIS — M545 Low back pain, unspecified: Secondary | ICD-10-CM | POA: Diagnosis not present

## 2021-03-31 DIAGNOSIS — G43109 Migraine with aura, not intractable, without status migrainosus: Secondary | ICD-10-CM | POA: Diagnosis not present

## 2021-03-31 DIAGNOSIS — Z794 Long term (current) use of insulin: Secondary | ICD-10-CM | POA: Diagnosis not present

## 2021-04-01 DIAGNOSIS — Z5181 Encounter for therapeutic drug level monitoring: Secondary | ICD-10-CM | POA: Diagnosis not present

## 2021-04-01 DIAGNOSIS — F411 Generalized anxiety disorder: Secondary | ICD-10-CM | POA: Diagnosis not present

## 2021-04-01 DIAGNOSIS — F3162 Bipolar disorder, current episode mixed, moderate: Secondary | ICD-10-CM | POA: Diagnosis not present

## 2021-04-03 DIAGNOSIS — F321 Major depressive disorder, single episode, moderate: Secondary | ICD-10-CM | POA: Diagnosis not present

## 2021-04-04 DIAGNOSIS — F321 Major depressive disorder, single episode, moderate: Secondary | ICD-10-CM | POA: Diagnosis not present

## 2021-04-07 DIAGNOSIS — Z419 Encounter for procedure for purposes other than remedying health state, unspecified: Secondary | ICD-10-CM | POA: Diagnosis not present

## 2021-04-10 DIAGNOSIS — F321 Major depressive disorder, single episode, moderate: Secondary | ICD-10-CM | POA: Diagnosis not present

## 2021-04-11 DIAGNOSIS — F321 Major depressive disorder, single episode, moderate: Secondary | ICD-10-CM | POA: Diagnosis not present

## 2021-04-12 DIAGNOSIS — F321 Major depressive disorder, single episode, moderate: Secondary | ICD-10-CM | POA: Diagnosis not present

## 2021-04-13 DIAGNOSIS — F321 Major depressive disorder, single episode, moderate: Secondary | ICD-10-CM | POA: Diagnosis not present

## 2021-04-20 ENCOUNTER — Other Ambulatory Visit: Payer: Self-pay

## 2021-04-20 ENCOUNTER — Ambulatory Visit: Payer: Medicaid Other | Admitting: Podiatry

## 2021-04-20 DIAGNOSIS — L03032 Cellulitis of left toe: Secondary | ICD-10-CM

## 2021-04-20 DIAGNOSIS — F321 Major depressive disorder, single episode, moderate: Secondary | ICD-10-CM | POA: Diagnosis not present

## 2021-04-20 DIAGNOSIS — L600 Ingrowing nail: Secondary | ICD-10-CM

## 2021-04-20 NOTE — Progress Notes (Signed)
  Subjective:  Patient ID: Yvonne Rios, female    DOB: August 10, 1990,  MRN: [id number]  Chief Complaint  Patient presents with   Ingrown Toenail    Ingrown nail on great toe left foot left side    30 y.o. female presents with the above complaint. History confirmed with patient. New issue now on the second toe  Objective:  Physical Exam: warm, good capillary refill, no trophic changes or ulcerative lesions, normal DP and PT pulses, and normal sensory exam. Left Foot: Ingrown medial border Assessment:   1. Paronychia of toe of left foot due to ingrown toenail      Plan:  Patient was evaluated and treated and all questions answered.    Ingrown Nail, left -Patient elects to proceed with minor surgery to remove ingrown toenail today. Consent reviewed and signed by patient. -Ingrown nail excised. See procedure note. -Educated on post-procedure care including soaking. Written instructions provided and reviewed. -Patient to follow up for permanent avulsion if needed.  I discussed this with her today but with her A1c at 8.8% I recommend we wait until this is lower to perform a matricectomy due to risk of wound infection or healing  Procedure: Excision of Ingrown Toenail Location: Left 2nd toe medial nail borders. Anesthesia: Lidocaine 1% plain; 1.5 mL and Marcaine 0.5% plain; 1.5 mL, digital block. Skin Prep: Betadine. Dressing: Silvadene; telfa; dry, sterile, compression dressing. Technique: Following skin prep, the toe was exsanguinated and a tourniquet was secured at the base of the toe. The affected nail border was freed, split with a nail splitter, and excised. The tourniquet was then removed and sterile dressing applied. Disposition: Patient tolerated procedure well.   Return if symptoms worsen or fail to improve.

## 2021-04-20 NOTE — Patient Instructions (Signed)

## 2021-04-21 DIAGNOSIS — F321 Major depressive disorder, single episode, moderate: Secondary | ICD-10-CM | POA: Diagnosis not present

## 2021-04-22 DIAGNOSIS — F4312 Post-traumatic stress disorder, chronic: Secondary | ICD-10-CM | POA: Diagnosis not present

## 2021-04-22 DIAGNOSIS — F3162 Bipolar disorder, current episode mixed, moderate: Secondary | ICD-10-CM | POA: Diagnosis not present

## 2021-04-22 DIAGNOSIS — F411 Generalized anxiety disorder: Secondary | ICD-10-CM | POA: Diagnosis not present

## 2021-04-22 DIAGNOSIS — F410 Panic disorder [episodic paroxysmal anxiety] without agoraphobia: Secondary | ICD-10-CM | POA: Diagnosis not present

## 2021-04-22 DIAGNOSIS — F901 Attention-deficit hyperactivity disorder, predominantly hyperactive type: Secondary | ICD-10-CM | POA: Diagnosis not present

## 2021-04-27 DIAGNOSIS — Z5181 Encounter for therapeutic drug level monitoring: Secondary | ICD-10-CM | POA: Diagnosis not present

## 2021-04-27 DIAGNOSIS — F3162 Bipolar disorder, current episode mixed, moderate: Secondary | ICD-10-CM | POA: Diagnosis not present

## 2021-04-27 DIAGNOSIS — F410 Panic disorder [episodic paroxysmal anxiety] without agoraphobia: Secondary | ICD-10-CM | POA: Diagnosis not present

## 2021-04-30 DIAGNOSIS — F321 Major depressive disorder, single episode, moderate: Secondary | ICD-10-CM | POA: Diagnosis not present

## 2021-05-02 DIAGNOSIS — F321 Major depressive disorder, single episode, moderate: Secondary | ICD-10-CM | POA: Diagnosis not present

## 2021-05-06 DIAGNOSIS — J301 Allergic rhinitis due to pollen: Secondary | ICD-10-CM | POA: Diagnosis not present

## 2021-05-06 NOTE — Telephone Encounter (Signed)
R/S to 08/28/2020. Mirena rcvd/charged 08/28/2020

## 2021-05-07 DIAGNOSIS — Z419 Encounter for procedure for purposes other than remedying health state, unspecified: Secondary | ICD-10-CM | POA: Diagnosis not present

## 2021-05-07 DIAGNOSIS — F321 Major depressive disorder, single episode, moderate: Secondary | ICD-10-CM | POA: Diagnosis not present

## 2021-05-08 DIAGNOSIS — F321 Major depressive disorder, single episode, moderate: Secondary | ICD-10-CM | POA: Diagnosis not present

## 2021-05-11 DIAGNOSIS — J301 Allergic rhinitis due to pollen: Secondary | ICD-10-CM | POA: Diagnosis not present

## 2021-05-14 DIAGNOSIS — F321 Major depressive disorder, single episode, moderate: Secondary | ICD-10-CM | POA: Diagnosis not present

## 2021-05-15 DIAGNOSIS — F321 Major depressive disorder, single episode, moderate: Secondary | ICD-10-CM | POA: Diagnosis not present

## 2021-05-21 DIAGNOSIS — F321 Major depressive disorder, single episode, moderate: Secondary | ICD-10-CM | POA: Diagnosis not present

## 2021-05-22 DIAGNOSIS — F321 Major depressive disorder, single episode, moderate: Secondary | ICD-10-CM | POA: Diagnosis not present

## 2021-05-25 DIAGNOSIS — Z5181 Encounter for therapeutic drug level monitoring: Secondary | ICD-10-CM | POA: Diagnosis not present

## 2021-05-25 DIAGNOSIS — F3162 Bipolar disorder, current episode mixed, moderate: Secondary | ICD-10-CM | POA: Diagnosis not present

## 2021-05-25 DIAGNOSIS — Z79899 Other long term (current) drug therapy: Secondary | ICD-10-CM | POA: Diagnosis not present

## 2021-05-28 DIAGNOSIS — F321 Major depressive disorder, single episode, moderate: Secondary | ICD-10-CM | POA: Diagnosis not present

## 2021-05-28 DIAGNOSIS — J069 Acute upper respiratory infection, unspecified: Secondary | ICD-10-CM | POA: Diagnosis not present

## 2021-05-29 DIAGNOSIS — F321 Major depressive disorder, single episode, moderate: Secondary | ICD-10-CM | POA: Diagnosis not present

## 2021-06-03 DIAGNOSIS — F410 Panic disorder [episodic paroxysmal anxiety] without agoraphobia: Secondary | ICD-10-CM | POA: Diagnosis not present

## 2021-06-03 DIAGNOSIS — F901 Attention-deficit hyperactivity disorder, predominantly hyperactive type: Secondary | ICD-10-CM | POA: Diagnosis not present

## 2021-06-03 DIAGNOSIS — F3162 Bipolar disorder, current episode mixed, moderate: Secondary | ICD-10-CM | POA: Diagnosis not present

## 2021-06-03 DIAGNOSIS — F411 Generalized anxiety disorder: Secondary | ICD-10-CM | POA: Diagnosis not present

## 2021-06-03 DIAGNOSIS — F4312 Post-traumatic stress disorder, chronic: Secondary | ICD-10-CM | POA: Diagnosis not present

## 2021-06-04 DIAGNOSIS — F321 Major depressive disorder, single episode, moderate: Secondary | ICD-10-CM | POA: Diagnosis not present

## 2021-06-06 DIAGNOSIS — F321 Major depressive disorder, single episode, moderate: Secondary | ICD-10-CM | POA: Diagnosis not present

## 2021-06-07 DIAGNOSIS — Z419 Encounter for procedure for purposes other than remedying health state, unspecified: Secondary | ICD-10-CM | POA: Diagnosis not present

## 2021-06-10 DIAGNOSIS — F321 Major depressive disorder, single episode, moderate: Secondary | ICD-10-CM | POA: Diagnosis not present

## 2021-06-12 DIAGNOSIS — F321 Major depressive disorder, single episode, moderate: Secondary | ICD-10-CM | POA: Diagnosis not present

## 2021-06-17 DIAGNOSIS — F321 Major depressive disorder, single episode, moderate: Secondary | ICD-10-CM | POA: Diagnosis not present

## 2021-06-19 DIAGNOSIS — F321 Major depressive disorder, single episode, moderate: Secondary | ICD-10-CM | POA: Diagnosis not present

## 2021-06-22 DIAGNOSIS — Z79899 Other long term (current) drug therapy: Secondary | ICD-10-CM | POA: Diagnosis not present

## 2021-06-22 DIAGNOSIS — Z5181 Encounter for therapeutic drug level monitoring: Secondary | ICD-10-CM | POA: Diagnosis not present

## 2021-06-22 DIAGNOSIS — F3162 Bipolar disorder, current episode mixed, moderate: Secondary | ICD-10-CM | POA: Diagnosis not present

## 2021-06-24 DIAGNOSIS — F321 Major depressive disorder, single episode, moderate: Secondary | ICD-10-CM | POA: Diagnosis not present

## 2021-06-26 DIAGNOSIS — F321 Major depressive disorder, single episode, moderate: Secondary | ICD-10-CM | POA: Diagnosis not present

## 2021-06-29 DIAGNOSIS — F3162 Bipolar disorder, current episode mixed, moderate: Secondary | ICD-10-CM | POA: Diagnosis not present

## 2021-06-29 DIAGNOSIS — F4312 Post-traumatic stress disorder, chronic: Secondary | ICD-10-CM | POA: Diagnosis not present

## 2021-06-29 DIAGNOSIS — F411 Generalized anxiety disorder: Secondary | ICD-10-CM | POA: Diagnosis not present

## 2021-06-29 DIAGNOSIS — F410 Panic disorder [episodic paroxysmal anxiety] without agoraphobia: Secondary | ICD-10-CM | POA: Diagnosis not present

## 2021-06-29 DIAGNOSIS — F901 Attention-deficit hyperactivity disorder, predominantly hyperactive type: Secondary | ICD-10-CM | POA: Diagnosis not present

## 2021-07-01 DIAGNOSIS — F321 Major depressive disorder, single episode, moderate: Secondary | ICD-10-CM | POA: Diagnosis not present

## 2021-07-03 DIAGNOSIS — F321 Major depressive disorder, single episode, moderate: Secondary | ICD-10-CM | POA: Diagnosis not present

## 2021-07-15 DIAGNOSIS — E119 Type 2 diabetes mellitus without complications: Secondary | ICD-10-CM | POA: Diagnosis not present

## 2021-07-15 DIAGNOSIS — M5416 Radiculopathy, lumbar region: Secondary | ICD-10-CM | POA: Diagnosis not present

## 2021-07-15 DIAGNOSIS — J4520 Mild intermittent asthma, uncomplicated: Secondary | ICD-10-CM | POA: Diagnosis not present

## 2021-07-15 DIAGNOSIS — Z794 Long term (current) use of insulin: Secondary | ICD-10-CM | POA: Diagnosis not present

## 2021-07-15 DIAGNOSIS — R5383 Other fatigue: Secondary | ICD-10-CM | POA: Diagnosis not present

## 2021-07-20 DIAGNOSIS — Z79899 Other long term (current) drug therapy: Secondary | ICD-10-CM | POA: Diagnosis not present

## 2021-07-20 DIAGNOSIS — F3162 Bipolar disorder, current episode mixed, moderate: Secondary | ICD-10-CM | POA: Diagnosis not present

## 2021-07-20 DIAGNOSIS — Z5181 Encounter for therapeutic drug level monitoring: Secondary | ICD-10-CM | POA: Diagnosis not present

## 2021-08-04 ENCOUNTER — Other Ambulatory Visit: Payer: Self-pay

## 2021-08-05 DIAGNOSIS — J301 Allergic rhinitis due to pollen: Secondary | ICD-10-CM | POA: Diagnosis not present

## 2021-08-06 DIAGNOSIS — J301 Allergic rhinitis due to pollen: Secondary | ICD-10-CM | POA: Diagnosis not present

## 2021-08-17 DIAGNOSIS — Z5181 Encounter for therapeutic drug level monitoring: Secondary | ICD-10-CM | POA: Diagnosis not present

## 2021-08-17 DIAGNOSIS — F3162 Bipolar disorder, current episode mixed, moderate: Secondary | ICD-10-CM | POA: Diagnosis not present

## 2021-08-17 DIAGNOSIS — Z79899 Other long term (current) drug therapy: Secondary | ICD-10-CM | POA: Diagnosis not present

## 2021-08-17 DIAGNOSIS — F4312 Post-traumatic stress disorder, chronic: Secondary | ICD-10-CM | POA: Diagnosis not present

## 2021-09-15 DIAGNOSIS — Z79899 Other long term (current) drug therapy: Secondary | ICD-10-CM | POA: Diagnosis not present

## 2021-09-15 DIAGNOSIS — F3181 Bipolar II disorder: Secondary | ICD-10-CM | POA: Diagnosis not present

## 2021-09-15 DIAGNOSIS — Z5181 Encounter for therapeutic drug level monitoring: Secondary | ICD-10-CM | POA: Diagnosis not present

## 2021-09-28 DIAGNOSIS — F901 Attention-deficit hyperactivity disorder, predominantly hyperactive type: Secondary | ICD-10-CM | POA: Diagnosis not present

## 2021-09-28 DIAGNOSIS — F411 Generalized anxiety disorder: Secondary | ICD-10-CM | POA: Diagnosis not present

## 2021-09-28 DIAGNOSIS — F3162 Bipolar disorder, current episode mixed, moderate: Secondary | ICD-10-CM | POA: Diagnosis not present

## 2021-09-28 DIAGNOSIS — F410 Panic disorder [episodic paroxysmal anxiety] without agoraphobia: Secondary | ICD-10-CM | POA: Diagnosis not present

## 2021-09-28 DIAGNOSIS — F4312 Post-traumatic stress disorder, chronic: Secondary | ICD-10-CM | POA: Diagnosis not present

## 2021-10-05 DIAGNOSIS — Z419 Encounter for procedure for purposes other than remedying health state, unspecified: Secondary | ICD-10-CM | POA: Diagnosis not present

## 2021-10-12 DIAGNOSIS — F321 Major depressive disorder, single episode, moderate: Secondary | ICD-10-CM | POA: Diagnosis not present

## 2021-10-15 DIAGNOSIS — F3162 Bipolar disorder, current episode mixed, moderate: Secondary | ICD-10-CM | POA: Diagnosis not present

## 2021-10-15 DIAGNOSIS — F4312 Post-traumatic stress disorder, chronic: Secondary | ICD-10-CM | POA: Diagnosis not present

## 2021-10-15 DIAGNOSIS — Z79899 Other long term (current) drug therapy: Secondary | ICD-10-CM | POA: Diagnosis not present

## 2021-10-15 DIAGNOSIS — Z5181 Encounter for therapeutic drug level monitoring: Secondary | ICD-10-CM | POA: Diagnosis not present

## 2021-10-16 DIAGNOSIS — F321 Major depressive disorder, single episode, moderate: Secondary | ICD-10-CM | POA: Diagnosis not present

## 2021-10-19 DIAGNOSIS — F321 Major depressive disorder, single episode, moderate: Secondary | ICD-10-CM | POA: Diagnosis not present

## 2021-10-23 DIAGNOSIS — F321 Major depressive disorder, single episode, moderate: Secondary | ICD-10-CM | POA: Diagnosis not present

## 2021-10-26 DIAGNOSIS — F321 Major depressive disorder, single episode, moderate: Secondary | ICD-10-CM | POA: Diagnosis not present

## 2021-10-27 DIAGNOSIS — E119 Type 2 diabetes mellitus without complications: Secondary | ICD-10-CM | POA: Diagnosis not present

## 2021-10-27 DIAGNOSIS — H43393 Other vitreous opacities, bilateral: Secondary | ICD-10-CM | POA: Diagnosis not present

## 2021-10-28 DIAGNOSIS — J301 Allergic rhinitis due to pollen: Secondary | ICD-10-CM | POA: Diagnosis not present

## 2021-10-30 DIAGNOSIS — F321 Major depressive disorder, single episode, moderate: Secondary | ICD-10-CM | POA: Diagnosis not present

## 2021-11-02 DIAGNOSIS — F321 Major depressive disorder, single episode, moderate: Secondary | ICD-10-CM | POA: Diagnosis not present

## 2021-11-05 DIAGNOSIS — Z419 Encounter for procedure for purposes other than remedying health state, unspecified: Secondary | ICD-10-CM | POA: Diagnosis not present

## 2021-11-06 DIAGNOSIS — F321 Major depressive disorder, single episode, moderate: Secondary | ICD-10-CM | POA: Diagnosis not present

## 2021-11-09 DIAGNOSIS — F321 Major depressive disorder, single episode, moderate: Secondary | ICD-10-CM | POA: Diagnosis not present

## 2021-11-10 DIAGNOSIS — E119 Type 2 diabetes mellitus without complications: Secondary | ICD-10-CM | POA: Diagnosis not present

## 2021-11-10 DIAGNOSIS — K219 Gastro-esophageal reflux disease without esophagitis: Secondary | ICD-10-CM | POA: Diagnosis not present

## 2021-11-10 DIAGNOSIS — H938X1 Other specified disorders of right ear: Secondary | ICD-10-CM | POA: Diagnosis not present

## 2021-11-10 DIAGNOSIS — Z794 Long term (current) use of insulin: Secondary | ICD-10-CM | POA: Diagnosis not present

## 2021-11-10 DIAGNOSIS — M5441 Lumbago with sciatica, right side: Secondary | ICD-10-CM | POA: Diagnosis not present

## 2021-11-10 DIAGNOSIS — R600 Localized edema: Secondary | ICD-10-CM | POA: Diagnosis not present

## 2021-11-10 DIAGNOSIS — G8929 Other chronic pain: Secondary | ICD-10-CM | POA: Diagnosis not present

## 2021-11-12 DIAGNOSIS — Z5181 Encounter for therapeutic drug level monitoring: Secondary | ICD-10-CM | POA: Diagnosis not present

## 2021-11-12 DIAGNOSIS — Z79899 Other long term (current) drug therapy: Secondary | ICD-10-CM | POA: Diagnosis not present

## 2021-11-12 DIAGNOSIS — F3162 Bipolar disorder, current episode mixed, moderate: Secondary | ICD-10-CM | POA: Diagnosis not present

## 2021-11-12 DIAGNOSIS — F4312 Post-traumatic stress disorder, chronic: Secondary | ICD-10-CM | POA: Diagnosis not present

## 2021-11-12 DIAGNOSIS — J301 Allergic rhinitis due to pollen: Secondary | ICD-10-CM | POA: Diagnosis not present

## 2021-11-13 DIAGNOSIS — F321 Major depressive disorder, single episode, moderate: Secondary | ICD-10-CM | POA: Diagnosis not present

## 2021-11-16 DIAGNOSIS — F321 Major depressive disorder, single episode, moderate: Secondary | ICD-10-CM | POA: Diagnosis not present

## 2021-11-20 DIAGNOSIS — F321 Major depressive disorder, single episode, moderate: Secondary | ICD-10-CM | POA: Diagnosis not present

## 2021-11-23 DIAGNOSIS — F321 Major depressive disorder, single episode, moderate: Secondary | ICD-10-CM | POA: Diagnosis not present

## 2021-11-27 DIAGNOSIS — F321 Major depressive disorder, single episode, moderate: Secondary | ICD-10-CM | POA: Diagnosis not present

## 2021-11-30 DIAGNOSIS — F321 Major depressive disorder, single episode, moderate: Secondary | ICD-10-CM | POA: Diagnosis not present

## 2021-12-04 DIAGNOSIS — F321 Major depressive disorder, single episode, moderate: Secondary | ICD-10-CM | POA: Diagnosis not present

## 2021-12-05 DIAGNOSIS — Z419 Encounter for procedure for purposes other than remedying health state, unspecified: Secondary | ICD-10-CM | POA: Diagnosis not present

## 2021-12-07 DIAGNOSIS — F321 Major depressive disorder, single episode, moderate: Secondary | ICD-10-CM | POA: Diagnosis not present

## 2021-12-10 DIAGNOSIS — Z5181 Encounter for therapeutic drug level monitoring: Secondary | ICD-10-CM | POA: Diagnosis not present

## 2021-12-10 DIAGNOSIS — Z79899 Other long term (current) drug therapy: Secondary | ICD-10-CM | POA: Diagnosis not present

## 2021-12-10 DIAGNOSIS — F3162 Bipolar disorder, current episode mixed, moderate: Secondary | ICD-10-CM | POA: Diagnosis not present

## 2021-12-11 DIAGNOSIS — F321 Major depressive disorder, single episode, moderate: Secondary | ICD-10-CM | POA: Diagnosis not present

## 2021-12-14 DIAGNOSIS — F321 Major depressive disorder, single episode, moderate: Secondary | ICD-10-CM | POA: Diagnosis not present

## 2021-12-18 DIAGNOSIS — F321 Major depressive disorder, single episode, moderate: Secondary | ICD-10-CM | POA: Diagnosis not present

## 2021-12-21 DIAGNOSIS — F321 Major depressive disorder, single episode, moderate: Secondary | ICD-10-CM | POA: Diagnosis not present

## 2021-12-25 DIAGNOSIS — F321 Major depressive disorder, single episode, moderate: Secondary | ICD-10-CM | POA: Diagnosis not present

## 2021-12-28 DIAGNOSIS — F411 Generalized anxiety disorder: Secondary | ICD-10-CM | POA: Diagnosis not present

## 2021-12-28 DIAGNOSIS — F3162 Bipolar disorder, current episode mixed, moderate: Secondary | ICD-10-CM | POA: Diagnosis not present

## 2021-12-28 DIAGNOSIS — F321 Major depressive disorder, single episode, moderate: Secondary | ICD-10-CM | POA: Diagnosis not present

## 2021-12-28 DIAGNOSIS — F410 Panic disorder [episodic paroxysmal anxiety] without agoraphobia: Secondary | ICD-10-CM | POA: Diagnosis not present

## 2021-12-28 DIAGNOSIS — F901 Attention-deficit hyperactivity disorder, predominantly hyperactive type: Secondary | ICD-10-CM | POA: Diagnosis not present

## 2021-12-28 DIAGNOSIS — F4312 Post-traumatic stress disorder, chronic: Secondary | ICD-10-CM | POA: Diagnosis not present

## 2021-12-30 ENCOUNTER — Encounter: Payer: Self-pay | Admitting: Emergency Medicine

## 2021-12-30 ENCOUNTER — Ambulatory Visit
Admission: EM | Admit: 2021-12-30 | Discharge: 2021-12-30 | Disposition: A | Discharge status: Home / Self Care | Payer: Medicaid Other | Attending: Emergency Medicine | Admitting: Emergency Medicine

## 2021-12-30 DIAGNOSIS — J029 Acute pharyngitis, unspecified: Secondary | ICD-10-CM

## 2021-12-30 DIAGNOSIS — U071 COVID-19: Secondary | ICD-10-CM | POA: Diagnosis not present

## 2021-12-30 DIAGNOSIS — Z20822 Contact with and (suspected) exposure to covid-19: Secondary | ICD-10-CM | POA: Insufficient documentation

## 2021-12-30 LAB — SARS CORONAVIRUS 2 BY RT PCR: SARS Coronavirus 2 by RT PCR: POSITIVE — AB

## 2021-12-30 LAB — GROUP A STREP BY PCR: Group A Strep by PCR: NOT DETECTED

## 2021-12-30 MED ORDER — FLUTICASONE PROPIONATE 50 MCG/ACT NA SUSP: 2.0000 | Freq: Every day | NASAL | 0 refills | Status: AC

## 2021-12-30 MED ORDER — IBUPROFEN 600 MG PO TABS: 600.0000 mg | ORAL_TABLET | Freq: Four times a day (QID) | ORAL | 0 refills | Status: AC | PRN

## 2021-12-30 MED ORDER — MOLNUPIRAVIR EUA 200MG CAPSULE: 4.0000 | ORAL_CAPSULE | Freq: Two times a day (BID) | ORAL | 0 refills | Status: AC

## 2021-12-30 NOTE — ED Provider Notes (Signed)
HPI  SUBJECTIVE:  Yvonne Rios is a 31 y.o. female who presents with 2 days of sore throat, fevers Tmax 101, chills, body aches, nasal congestion, clear rhinorrhea, postnasal drip, maxillary sinus pain and pressure, cough, nausea, raspy voice.  No wheezing, shortness of breath, loss of sense of smell or taste, vomiting, diarrhea, abdominal pain, neck stiffness, trismus, drooling, muffled voice, sensation of throat swelling shut.  No facial swelling, upper dental pain.  No strep, mono, COVID exposure.  She got 2 doses of the COVID-vaccine.  No antibiotics in the past month.  She took ibuprofen 600 mg with improvement in her symptoms within the past 6 hours.  Sore throat is worse with talking.  She has a past medical history of diabetes, asthma, MRSA.   Past Medical History:  Diagnosis Date   ADD (attention deficit disorder)    Anxiety    Asthma    Depression    Diabetes mellitus without complication (HCC)    GERD (gastroesophageal reflux disease)    IBS (irritable bowel syndrome)    Migraine    MRSA (methicillin resistant staph aureus) culture positive    Oligomenorrhea    Pyloric stenosis    UTI (urinary tract infection)     Past Surgical History:  Procedure Laterality Date   COLPOSCOPY     DILATION AND CURETTAGE OF UTERUS  age 53   HYSTEROSCOPY  2015   TONSILLECTOMY     WISDOM TOOTH EXTRACTION      Family History  Problem Relation Age of Onset   Ovarian cancer Mother 55   Other Father     Social History   Tobacco Use   Smoking status: Former   Smokeless tobacco: Never   Tobacco comments:    smoked as teen  Scientific laboratory technician Use: Never used  Substance Use Topics   Alcohol use: Not Currently    Alcohol/week: 0.0 standard drinks of alcohol    Comment: rarely   Drug use: No    No current facility-administered medications for this encounter.  Current Outpatient Medications:    ADDERALL XR 20 MG 24 hr capsule, Take 1 capsule (20 mg total) by mouth 2 (two)  times daily., Disp: 60 capsule, Rfl: 0   albuterol (PROVENTIL HFA;VENTOLIN HFA) 108 (90 Base) MCG/ACT inhaler, Inhale 2 puffs into the lungs every 4 (four) hours as needed for wheezing or shortness of breath., Disp: 1 Inhaler, Rfl: 0   clonazePAM (KLONOPIN) 0.5 MG tablet, Take 1 tablet (0.5 mg total) by mouth 2 (two) times daily as needed for anxiety. TAKE 1/2 TABLET BY MOUTH EVERY MORNING, TAKE 1/2 TABLET BY MOUTH EVERY AFTERNOON & TAKE ONE TABLET BY MOUTH AT BEDTIME, Disp: 60 tablet, Rfl: 0   fluticasone (FLONASE) 50 MCG/ACT nasal spray, Place 2 sprays into both nostrils daily., Disp: 16 g, Rfl: 0   Fluticasone-Salmeterol (ADVAIR) 250-50 MCG/DOSE AEPB, Inhale 1 puff into the lungs daily., Disp: 60 each, Rfl: 3   ibuprofen (ADVIL) 600 MG tablet, Take 1 tablet (600 mg total) by mouth every 6 (six) hours as needed., Disp: 30 tablet, Rfl: 0   metFORMIN (GLUCOPHAGE) 500 MG tablet, Take 1 tablet (500 mg total) by mouth 4 (four) times daily., Disp: 120 tablet, Rfl: 2   molnupiravir EUA (LAGEVRIO) 200 mg CAPS capsule, Take 4 capsules (800 mg total) by mouth 2 (two) times daily for 5 days., Disp: 40 capsule, Rfl: 0   Spacer/Aero-Holding Chambers (AEROCHAMBER PLUS) inhaler, Use as instructed, Disp: 1 each, Rfl: 2  SYMBICORT 160-4.5 MCG/ACT inhaler, Inhale 2 puffs into the lungs in the morning and at bedtime., Disp: , Rfl:    acetaminophen (TYLENOL) 325 MG tablet, Take 2 tablets (650 mg total) by mouth every 4 (four) hours as needed (for pain scale < 4)., Disp: , Rfl:    Blood Glucose Monitoring Suppl (ACCU-CHEK AVIVA PLUS) w/Device KIT, 1 Units by Does not apply route in the morning, at noon, in the evening, and at bedtime., Disp: 1 kit, Rfl: 0   EPINEPHrine 0.3 mg/0.3 mL IJ SOAJ injection, USE AS DIRECTED PER MD FOR ANAPHYLAXIS, Disp: , Rfl:    famotidine (PEPCID) 20 MG tablet, Take 1 tablet (20 mg total) by mouth daily., Disp: 30 tablet, Rfl: 11   folic acid (FOLVITE) 1 MG tablet, Take 1 tablet (1 mg total)  by mouth daily., Disp: 30 tablet, Rfl: 10   glipiZIDE (GLUCOTROL XL) 10 MG 24 hr tablet, Take 1 tablet (10 mg total) by mouth daily with breakfast., Disp: 30 tablet, Rfl: 3   glucose blood test strip, Use as instructed, Disp: 100 each, Rfl: 12   metoCLOPramide (REGLAN) 10 MG tablet, Take 1 tablet (10 mg total) by mouth 3 (three) times daily before meals., Disp: 90 tablet, Rfl: 0   montelukast (SINGULAIR) 10 MG tablet, Take 1 tablet (10 mg total) by mouth at bedtime., Disp: 30 tablet, Rfl: 11   pantoprazole (PROTONIX) 40 MG tablet, Take 1 tablet (40 mg total) by mouth daily., Disp: 30 tablet, Rfl: 11   Prenatal Vit-Fe Fumarate-FA (PRENATAL MULTIVITAMIN) TABS tablet, Take 1 tablet by mouth daily at 12 noon., Disp: , Rfl:    sertraline (ZOLOFT) 50 MG tablet, Take 1 tablet (50 mg total) by mouth daily., Disp: 30 tablet, Rfl: 2  Allergies  Allergen Reactions   Cefprozil Other (See Comments)   Amoxil [Amoxicillin] Rash   Augmentin [Amoxicillin-Pot Clavulanate] Rash     ROS  As noted in HPI.   Physical Exam  BP 127/86 (BP Location: Right Wrist)   Pulse 90   Temp 98.6 F (37 C) (Oral)   Resp 16   SpO2 98%   Constitutional: Well developed, well nourished, no acute distress Eyes:  EOMI, conjunctiva normal bilaterally HENT: Normocephalic, atraumatic,mucus membranes moist.  Clear rhinorrhea.  Erythematous, swollen turbinates.  No maxillary, frontal sinus tenderness.  Tonsils surgically absent.  Uvula midline.  No soft palate, tonsillar pillar swelling.  Slightly erythematous oropharynx.  Positive cobblestoning.  No postnasal drip. Neck: Positive cervical lymphadenopathy Respiratory: Normal inspiratory effort, lungs clear bilaterally Cardiovascular: Normal rate, regular rhythm, no murmurs GI: nondistended, soft, no splenomegaly skin: No rash, skin intact Musculoskeletal: no deformities Neurologic: Alert & oriented x 3, no focal neuro deficits Psychiatric: Speech and behavior  appropriate   ED Course   Medications - No data to display  Orders Placed This Encounter  Procedures   Group A Strep by PCR    Standing Status:   Standing    Number of Occurrences:   1   SARS Coronavirus 2 by RT PCR (hospital order, performed in [place] hospital lab) *cepheid single result test* Anterior Nasal Swab    Standing Status:   Standing    Number of Occurrences:   1    Results for orders placed or performed during the hospital encounter of 12/30/21 (from the past 24 hour(s))  Group A Strep by PCR     Status: None   Collection Time: 12/30/21 10:32 AM   Specimen: Throat; Sterile Swab  Result Value  Ref Range   Group A Strep by PCR NOT DETECTED NOT DETECTED  SARS Coronavirus 2 by RT PCR (hospital order, performed in [facility] hospital lab) *cepheid single result test* Anterior Nasal Swab     Status: Abnormal   Collection Time: 12/30/21 11:11 AM   Specimen: Anterior Nasal Swab  Result Value Ref Range   SARS Coronavirus 2 by RT PCR POSITIVE (A) NEGATIVE   No results found.  ED Clinical Impression  1. COVID-19 virus infection   2. Sore throat   3. Encounter for laboratory testing for COVID-19 virus      ED Assessment/Plan  Patient with acute illness with systemic symptoms.  Strep PCR negative.  Checking COVID.   COVID-positive.  Will prescribe molnupiravir due to history of asthma.  In the meantime, Mucinex D, Flonase, saline nasal irrigation, Benadryl/Maalox mixture, Tylenol/ibuprofen.  ER return precautions given.  Called patient and discussed positive COVID test result with her.  Advised her of molnupiravir waiting for her at the pharmacy.  Advised her to not get pregnant for 3 months after finishing the medication.  Otherwise, supportive treatment.  Discussed labs, MDM, treatment plan, and plan for follow-up with patient. Discussed sn/sx that should prompt return to the ED. patient agrees with plan.   Meds ordered this encounter  Medications    fluticasone (FLONASE) 50 MCG/ACT nasal spray    Sig: Place 2 sprays into both nostrils daily.    Dispense:  16 g    Refill:  0   ibuprofen (ADVIL) 600 MG tablet    Sig: Take 1 tablet (600 mg total) by mouth every 6 (six) hours as needed.    Dispense:  30 tablet    Refill:  0   molnupiravir EUA (LAGEVRIO) 200 mg CAPS capsule    Sig: Take 4 capsules (800 mg total) by mouth 2 (two) times daily for 5 days.    Dispense:  40 capsule    Refill:  0      *This clinic note was created using [employer]. Therefore, there may be occasional mistakes despite careful proofreading.  ?    Melynda Ripple, MD 12/30/21 1155

## 2021-12-30 NOTE — ED Triage Notes (Signed)
Pt presents with ST, runny nose, chills and bodyaches x 2 days

## 2021-12-30 NOTE — Discharge Instructions (Addendum)
Your strep PCR came back negative.  I will contact you if and only if your COVID comes back positive.  If you do not hear from you by the end of the day, you can assume that it is negative.  1 gram of Tylenol and 600 mg ibuprofen together 3-4 times a day as needed for pain.  Make sure you drink plenty of extra fluids.  Some people find salt water gargles and  Traditional Medic[employer]'s "Throat Coat" tea helpful. Take 5 mL of liquid Benadryl and 5 mL of Maalox. Mix it together, and then hold it in your mouth for as long as you can and then swallow. You may do this 4 times a day.    Mucinex D, Flonase, saline nasal irrigation with a Lloyd Huger Med rinse and distilled water as often as you want for the nasal congestion, postnasal drip.  This will help prevent a bacterial sinus infection.  Go to www.goodrx.com  or www.costplusdrugs.com to look up your medications. This will give you a list of where you can find your prescriptions at the most affordable prices. Or ask the pharmacist what the cash price is, or if they have any other discount programs available to help make your medication more affordable. This can be less expensive than what you would pay with insurance.

## 2022-01-01 DIAGNOSIS — F321 Major depressive disorder, single episode, moderate: Secondary | ICD-10-CM | POA: Diagnosis not present

## 2022-01-04 DIAGNOSIS — F321 Major depressive disorder, single episode, moderate: Secondary | ICD-10-CM | POA: Diagnosis not present

## 2022-01-05 DIAGNOSIS — Z419 Encounter for procedure for purposes other than remedying health state, unspecified: Secondary | ICD-10-CM | POA: Diagnosis not present

## 2022-01-08 DIAGNOSIS — F321 Major depressive disorder, single episode, moderate: Secondary | ICD-10-CM | POA: Diagnosis not present

## 2022-01-11 DIAGNOSIS — F321 Major depressive disorder, single episode, moderate: Secondary | ICD-10-CM | POA: Diagnosis not present

## 2022-01-15 DIAGNOSIS — F321 Major depressive disorder, single episode, moderate: Secondary | ICD-10-CM | POA: Diagnosis not present

## 2022-01-18 DIAGNOSIS — F321 Major depressive disorder, single episode, moderate: Secondary | ICD-10-CM | POA: Diagnosis not present

## 2022-01-22 DIAGNOSIS — F321 Major depressive disorder, single episode, moderate: Secondary | ICD-10-CM | POA: Diagnosis not present

## 2022-01-25 DIAGNOSIS — F321 Major depressive disorder, single episode, moderate: Secondary | ICD-10-CM | POA: Diagnosis not present

## 2022-01-27 DIAGNOSIS — F411 Generalized anxiety disorder: Secondary | ICD-10-CM | POA: Diagnosis not present

## 2022-01-27 DIAGNOSIS — T8332XA Displacement of intrauterine contraceptive device, initial encounter: Secondary | ICD-10-CM | POA: Diagnosis not present

## 2022-01-27 DIAGNOSIS — F3162 Bipolar disorder, current episode mixed, moderate: Secondary | ICD-10-CM | POA: Diagnosis not present

## 2022-01-27 DIAGNOSIS — F4312 Post-traumatic stress disorder, chronic: Secondary | ICD-10-CM | POA: Diagnosis not present

## 2022-01-27 DIAGNOSIS — F410 Panic disorder [episodic paroxysmal anxiety] without agoraphobia: Secondary | ICD-10-CM | POA: Diagnosis not present

## 2022-01-27 DIAGNOSIS — F901 Attention-deficit hyperactivity disorder, predominantly hyperactive type: Secondary | ICD-10-CM | POA: Diagnosis not present

## 2022-01-28 DIAGNOSIS — F321 Major depressive disorder, single episode, moderate: Secondary | ICD-10-CM | POA: Diagnosis not present

## 2022-01-31 DIAGNOSIS — Z79899 Other long term (current) drug therapy: Secondary | ICD-10-CM | POA: Diagnosis not present

## 2022-02-01 DIAGNOSIS — Z5181 Encounter for therapeutic drug level monitoring: Secondary | ICD-10-CM | POA: Diagnosis not present

## 2022-02-01 DIAGNOSIS — F3162 Bipolar disorder, current episode mixed, moderate: Secondary | ICD-10-CM | POA: Diagnosis not present

## 2022-02-01 DIAGNOSIS — F321 Major depressive disorder, single episode, moderate: Secondary | ICD-10-CM | POA: Diagnosis not present

## 2022-02-04 DIAGNOSIS — F321 Major depressive disorder, single episode, moderate: Secondary | ICD-10-CM | POA: Diagnosis not present

## 2022-02-05 DIAGNOSIS — Z419 Encounter for procedure for purposes other than remedying health state, unspecified: Secondary | ICD-10-CM | POA: Diagnosis not present

## 2022-02-08 DIAGNOSIS — F321 Major depressive disorder, single episode, moderate: Secondary | ICD-10-CM | POA: Diagnosis not present

## 2022-02-10 DIAGNOSIS — Z794 Long term (current) use of insulin: Secondary | ICD-10-CM | POA: Diagnosis not present

## 2022-02-10 DIAGNOSIS — J301 Allergic rhinitis due to pollen: Secondary | ICD-10-CM | POA: Diagnosis not present

## 2022-02-10 DIAGNOSIS — F419 Anxiety disorder, unspecified: Secondary | ICD-10-CM | POA: Diagnosis not present

## 2022-02-10 DIAGNOSIS — F32A Depression, unspecified: Secondary | ICD-10-CM | POA: Diagnosis not present

## 2022-02-10 DIAGNOSIS — Z23 Encounter for immunization: Secondary | ICD-10-CM | POA: Diagnosis not present

## 2022-02-10 DIAGNOSIS — E119 Type 2 diabetes mellitus without complications: Secondary | ICD-10-CM | POA: Diagnosis not present

## 2022-02-11 DIAGNOSIS — F321 Major depressive disorder, single episode, moderate: Secondary | ICD-10-CM | POA: Diagnosis not present

## 2022-02-15 DIAGNOSIS — F321 Major depressive disorder, single episode, moderate: Secondary | ICD-10-CM | POA: Diagnosis not present

## 2022-02-18 DIAGNOSIS — F321 Major depressive disorder, single episode, moderate: Secondary | ICD-10-CM | POA: Diagnosis not present

## 2022-02-18 DIAGNOSIS — J301 Allergic rhinitis due to pollen: Secondary | ICD-10-CM | POA: Diagnosis not present

## 2022-02-22 DIAGNOSIS — F321 Major depressive disorder, single episode, moderate: Secondary | ICD-10-CM | POA: Diagnosis not present

## 2022-02-26 DIAGNOSIS — F321 Major depressive disorder, single episode, moderate: Secondary | ICD-10-CM | POA: Diagnosis not present

## 2022-02-26 DIAGNOSIS — F901 Attention-deficit hyperactivity disorder, predominantly hyperactive type: Secondary | ICD-10-CM | POA: Diagnosis not present

## 2022-02-26 DIAGNOSIS — F3162 Bipolar disorder, current episode mixed, moderate: Secondary | ICD-10-CM | POA: Diagnosis not present

## 2022-02-26 DIAGNOSIS — F411 Generalized anxiety disorder: Secondary | ICD-10-CM | POA: Diagnosis not present

## 2022-02-26 DIAGNOSIS — F4312 Post-traumatic stress disorder, chronic: Secondary | ICD-10-CM | POA: Diagnosis not present

## 2022-02-26 DIAGNOSIS — F410 Panic disorder [episodic paroxysmal anxiety] without agoraphobia: Secondary | ICD-10-CM | POA: Diagnosis not present

## 2022-03-01 DIAGNOSIS — F3162 Bipolar disorder, current episode mixed, moderate: Secondary | ICD-10-CM | POA: Diagnosis not present

## 2022-03-01 DIAGNOSIS — F321 Major depressive disorder, single episode, moderate: Secondary | ICD-10-CM | POA: Diagnosis not present

## 2022-03-01 DIAGNOSIS — Z5181 Encounter for therapeutic drug level monitoring: Secondary | ICD-10-CM | POA: Diagnosis not present

## 2022-03-02 DIAGNOSIS — Z79899 Other long term (current) drug therapy: Secondary | ICD-10-CM | POA: Diagnosis not present

## 2022-03-04 DIAGNOSIS — M5441 Lumbago with sciatica, right side: Secondary | ICD-10-CM | POA: Diagnosis not present

## 2022-03-04 DIAGNOSIS — G8929 Other chronic pain: Secondary | ICD-10-CM | POA: Diagnosis not present

## 2022-03-04 DIAGNOSIS — M533 Sacrococcygeal disorders, not elsewhere classified: Secondary | ICD-10-CM | POA: Diagnosis not present

## 2022-03-05 DIAGNOSIS — F321 Major depressive disorder, single episode, moderate: Secondary | ICD-10-CM | POA: Diagnosis not present

## 2022-03-07 DIAGNOSIS — Z419 Encounter for procedure for purposes other than remedying health state, unspecified: Secondary | ICD-10-CM | POA: Diagnosis not present

## 2022-03-08 DIAGNOSIS — F321 Major depressive disorder, single episode, moderate: Secondary | ICD-10-CM | POA: Diagnosis not present

## 2022-03-09 DIAGNOSIS — F321 Major depressive disorder, single episode, moderate: Secondary | ICD-10-CM | POA: Diagnosis not present

## 2022-03-12 ENCOUNTER — Ambulatory Visit
Admission: EM | Admit: 2022-03-12 | Discharge: 2022-03-12 | Disposition: A | Discharge status: Home / Self Care | Payer: Medicaid Other | Attending: Emergency Medicine | Admitting: Emergency Medicine

## 2022-03-12 DIAGNOSIS — N760 Acute vaginitis: Secondary | ICD-10-CM | POA: Diagnosis not present

## 2022-03-12 DIAGNOSIS — B3731 Acute candidiasis of vulva and vagina: Secondary | ICD-10-CM | POA: Insufficient documentation

## 2022-03-12 DIAGNOSIS — A5901 Trichomonal vulvovaginitis: Secondary | ICD-10-CM | POA: Insufficient documentation

## 2022-03-12 DIAGNOSIS — B9689 Other specified bacterial agents as the cause of diseases classified elsewhere: Secondary | ICD-10-CM | POA: Diagnosis not present

## 2022-03-12 LAB — URINALYSIS, ROUTINE W REFLEX MICROSCOPIC
Bilirubin Urine: NEGATIVE
Glucose, UA: NEGATIVE mg/dL
Hgb urine dipstick: NEGATIVE
Ketones, ur: NEGATIVE mg/dL
Nitrite: NEGATIVE
Protein, ur: NEGATIVE mg/dL
Specific Gravity, Urine: 1.015 (ref 1.005–1.030)
pH: 7 (ref 5.0–8.0)

## 2022-03-12 LAB — WET PREP, GENITAL
Sperm: NONE SEEN
WBC, Wet Prep HPF POC: >10 — AB (ref <10)

## 2022-03-12 LAB — URINALYSIS, MICROSCOPIC (REFLEX)

## 2022-03-12 LAB — PREGNANCY, URINE: Preg Test, Ur: NEGATIVE

## 2022-03-12 MED ORDER — METRONIDAZOLE 500 MG PO TABS: 500.0000 mg | ORAL_TABLET | Freq: Two times a day (BID) | ORAL | 0 refills | Status: DC

## 2022-03-12 MED ORDER — FLUCONAZOLE 150 MG PO TABS: 150.0000 mg | ORAL_TABLET | Freq: Every day | ORAL | 0 refills | Status: AC

## 2022-03-12 NOTE — ED Triage Notes (Signed)
Pt c/o vaginal discharge x2 weeks, pt reports color to discharge is yellow. Pt reports she does also have vaginal itching as well.

## 2022-03-12 NOTE — ED Provider Notes (Signed)
[facility]    CSN: [id number] Arrival date & time: 03/12/22  1319      History   Chief Complaint Chief Complaint  Patient presents with   Vaginal Discharge   Vaginal Itching    HPI Yvonne Rios is a 31 y.o. female.   HPI  31 year old female here for evaluation of vaginal discharge.  Patient reports that for the last 2 weeks she has been experiencing a thin, malodorous, yellow vaginal discharge and also vaginal itching.  She denies any pain with urination or urinary urgency or frequency.  No fever or low back pain.  No nausea or vomiting.  Patient reports that she and her husband have had several sexual partners and there is a potential for STIs.  She states she has not asked any of her partners if they have symptoms.  Past Medical History:  Diagnosis Date   ADD (attention deficit disorder)    Anxiety    Asthma    Depression    Diabetes mellitus without complication (HCC)    GERD (gastroesophageal reflux disease)    IBS (irritable bowel syndrome)    Migraine    MRSA (methicillin resistant staph aureus) culture positive    Oligomenorrhea    Pyloric stenosis    UTI (urinary tract infection)     Patient Active Problem List   Diagnosis Date Noted   Encounter for postpartum care and examination after delivery 07/08/2020   Care and examination of lactating mother 07/08/2020   Uncontrolled type 2 diabetes mellitus with hyperglycemia ([place]) 04/04/2020   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   Attention deficit disorder (ADD) in adult 09/28/2019   Myopia, left eye 12/07/2017   Migraine without status migrainosus, not intractable 11/19/2015   Morbid obesity due to excess calories ([place]) 11/19/2015   Environmental allergies 03/17/2015   Asthma 11/21/2014   Mild intermittent asthma without complication 65/08/5463   Left-sided low back pain with sciatica 08/09/2014   Insomnia 11/20/2013   Functional constipation 07/03/2013   GERD (gastroesophageal reflux  disease) 07/03/2013   Anxiety 05/16/2013   Depression 05/16/2013    Past Surgical History:  Procedure Laterality Date   COLPOSCOPY     DILATION AND CURETTAGE OF UTERUS  age 65   HYSTEROSCOPY  45   TONSILLECTOMY     WISDOM TOOTH EXTRACTION      OB History     Gravida  4   Para  2   Term  2   Preterm      AB  2   Living  2      SAB  2   IAB      Ectopic      Multiple  0   Live Births  2            Home Medications    Prior to Admission medications   Medication Sig Start Date End Date Taking? Authorizing Provider  fluconazole (DIFLUCAN) 150 MG tablet Take 1 tablet (150 mg total) by mouth daily for 2 doses. 03/12/22 03/14/22 Yes Margarette Canada, NP  metroNIDAZOLE (FLAGYL) 500 MG tablet Take 1 tablet (500 mg total) by mouth 2 (two) times daily. 03/12/22  Yes Margarette Canada, NP  acetaminophen (TYLENOL) 325 MG tablet Take 2 tablets (650 mg total) by mouth every 4 (four) hours as needed (for pain scale < 4). 07/08/20   Veal, Katelyn, CNM  ADDERALL XR 20 MG 24 hr capsule Take 1 capsule (20 mg total) by mouth 2 (two) times daily. 06/17/20  Malachy Mood, MD  albuterol (PROVENTIL HFA;VENTOLIN HFA) 108 (90 Base) MCG/ACT inhaler Inhale 2 puffs into the lungs every 4 (four) hours as needed for wheezing or shortness of breath. 05/01/17   Melynda Ripple, MD  Blood Glucose Monitoring Suppl (ACCU-CHEK AVIVA PLUS) w/Device KIT 1 Units by Does not apply route in the morning, at noon, in the evening, and at bedtime. 01/07/20   Malachy Mood, MD  clonazePAM (KLONOPIN) 0.5 MG tablet Take 1 tablet (0.5 mg total) by mouth 2 (two) times daily as needed for anxiety. TAKE 1/2 TABLET BY MOUTH EVERY MORNING, TAKE 1/2 TABLET BY MOUTH EVERY AFTERNOON & TAKE ONE TABLET BY MOUTH AT BEDTIME 06/17/20   Malachy Mood, MD  EPINEPHrine 0.3 mg/0.3 mL IJ SOAJ injection USE AS DIRECTED PER MD FOR ANAPHYLAXIS 06/09/19   [provider]  famotidine (PEPCID) 20 MG tablet Take 1 tablet (20 mg  total) by mouth daily. 12/28/19 03/12/21  Malachy Mood, MD  fluticasone (FLONASE) 50 MCG/ACT nasal spray Place 2 sprays into both nostrils daily. 12/30/21   Melynda Ripple, MD  Fluticasone-Salmeterol (ADVAIR) 250-50 MCG/DOSE AEPB Inhale 1 puff into the lungs daily. 12/28/19   Malachy Mood, MD  folic acid (FOLVITE) 1 MG tablet Take 1 tablet (1 mg total) by mouth daily. 01/25/20   Malachy Mood, MD  glipiZIDE (GLUCOTROL XL) 10 MG 24 hr tablet Take 1 tablet (10 mg total) by mouth daily with breakfast. 07/09/20   Orlie Pollen, CNM  glucose blood test strip Use as instructed 01/07/20   Malachy Mood, MD  ibuprofen (ADVIL) 600 MG tablet Take 1 tablet (600 mg total) by mouth every 6 (six) hours as needed. 12/30/21   Melynda Ripple, MD  metFORMIN (GLUCOPHAGE) 500 MG tablet Take 1 tablet (500 mg total) by mouth 4 (four) times daily. 07/08/20   Orlie Pollen, CNM  metoCLOPramide (REGLAN) 10 MG tablet Take 1 tablet (10 mg total) by mouth 3 (three) times daily before meals. 08/15/20   Malachy Mood, MD  montelukast (SINGULAIR) 10 MG tablet Take 1 tablet (10 mg total) by mouth at bedtime. 12/28/19 12/27/20  Malachy Mood, MD  pantoprazole (PROTONIX) 40 MG tablet Take 1 tablet (40 mg total) by mouth daily. 12/28/19 12/27/20  Malachy Mood, MD  Prenatal Vit-Fe Fumarate-FA (PRENATAL MULTIVITAMIN) TABS tablet Take 1 tablet by mouth daily at 12 noon. 07/08/20   Orlie Pollen, CNM  sertraline (ZOLOFT) 50 MG tablet Take 1 tablet (50 mg total) by mouth daily. 12/28/19   Malachy Mood, MD  Spacer/Aero-Holding Chambers (AEROCHAMBER PLUS) inhaler Use as instructed 05/01/17   Melynda Ripple, MD  SYMBICORT 160-4.5 MCG/ACT inhaler Inhale 2 puffs into the lungs in the morning and at bedtime. 12/05/19   [provider]    Family History Family History  Problem Relation Age of Onset   Ovarian cancer Mother 36   Other Father     Social History Social History   Tobacco Use   Smoking  status: Former   Smokeless tobacco: Never   Tobacco comments:    smoked as teen  Scientific laboratory technician Use: Never used  Substance Use Topics   Alcohol use: Not Currently    Alcohol/week: 0.0 standard drinks of alcohol    Comment: rarely   Drug use: No     Allergies   Cefprozil, Amoxil [amoxicillin], and Augmentin [amoxicillin-pot clavulanate]   Review of Systems Review of Systems  Constitutional:  Negative for fever.  Gastrointestinal:  Negative for abdominal pain, nausea and vomiting.  Genitourinary:  Positive for vaginal discharge and vaginal pain. Negative for dysuria, frequency, hematuria and urgency.  Musculoskeletal:  Negative for back pain.     Physical Exam Triage Vital Signs ED Triage Vitals [03/12/22 1424]  Enc Vitals Group     BP      Pulse      Resp      Temp      Temp src      SpO2      Weight (!) 325 lb (147.4 kg)     Height $Remov'5\' 8"'WiWpkW$  (1.727 m)     Head Circumference      Peak Flow      Pain Score 0     Pain Loc      Pain Edu?      Excl. in [place]?    No data found.  Updated Vital Signs BP (!) 145/89 (BP Location: Right Arm)   Pulse 80   Temp 98.1 F (36.7 C) (Oral)   Ht $R'5\' 8"'cj$  (1.727 m)   Wt (!) 325 lb (147.4 kg)   LMP  (Exact Date)   SpO2 96%   Breastfeeding No   BMI 49.42 kg/m   Visual Acuity Right Eye Distance:   Left Eye Distance:   Bilateral Distance:    Right Eye Near:   Left Eye Near:    Bilateral Near:     Physical Exam Vitals and nursing note reviewed.  Constitutional:      Appearance: Normal appearance. She is not ill-appearing.  HENT:     Head: Normocephalic and atraumatic.  Cardiovascular:     Rate and Rhythm: Normal rate and regular rhythm.     Pulses: Normal pulses.     Heart sounds: Normal heart sounds. No murmur heard.    No friction rub. No gallop.  Pulmonary:     Effort: Pulmonary effort is normal.     Breath sounds: Normal breath sounds. No wheezing, rhonchi or rales.  Abdominal:     Tenderness: There is no  right CVA tenderness or left CVA tenderness.  Skin:    General: Skin is warm and dry.     Capillary Refill: Capillary refill takes less than 2 seconds.     Findings: No erythema or rash.  Neurological:     General: No focal deficit present.     Mental Status: She is alert and oriented to person, place, and time.  Psychiatric:        Mood and Affect: Mood normal.        Behavior: Behavior normal.        Thought Content: Thought content normal.        Judgment: Judgment normal.      UC Treatments / Results  Labs (all labs ordered are listed, but only abnormal results are displayed) Labs Reviewed  WET PREP, GENITAL - Abnormal; Notable for the following components:      Result Value   Yeast Wet Prep HPF POC PRESENT (*)    Trich, Wet Prep PRESENT (*)    Clue Cells Wet Prep HPF POC PRESENT (*)    WBC, Wet Prep HPF POC >10 (*)    All other components within normal limits  URINALYSIS, ROUTINE W REFLEX MICROSCOPIC - Abnormal; Notable for the following components:   Leukocytes,Ua MODERATE (*)    All other components within normal limits  URINALYSIS, MICROSCOPIC (REFLEX) - Abnormal; Notable for the following components:   Bacteria, UA FEW (*)    All other components within normal limits  PREGNANCY, URINE  CERVICOVAGINAL ANCILLARY ONLY    EKG   Radiology No results found.  Procedures Procedures (including critical care time)  Medications Ordered in UC Medications - No data to display  Initial Impression / Assessment and Plan / UC Course  I have reviewed the triage vital signs and the nursing notes.  Pertinent labs & imaging results that were available during my care of the patient were reviewed by me and considered in my medical decision making (see chart for details).   Patient is nontoxic-appearing 31 year old female here requesting evaluation of a yellow thin malodorous discharge that she has had from her vagina for the past 2 weeks.  She also complains of vaginal  itching.  She and her husband have an open sex relationship and they have had multiple partners.  She is unsure if any of her partners are symptomatic.  She denies any urinary symptoms, low back pain, fever, nausea, or vomiting.  She has no CVA tenderness on exam.  Lung sounds are clear to auscultation all fields.  I will order gonorrhea and Chlamydia testing, vaginal wet prep, urinalysis, and urine pregnancy test.  Patient does have an IUD in place.  Urine pregnancy test is negative.  Urinalysis shows moderate leukocyte esterase but is negative for nitrates, protein, ketones, or blood.  Specific gravity is normal at 1.015.  Reflex microscopy shows few bacteria and budding yeast.  Vaginal wet prep is positive for yeast, trichomonas, and clue cells.  I will discharge patient home with a diagnosis of trichomonas infection, bacterial vaginosis, and vaginal yeast infection.  The trichomoniasis and clue cells I will treat with metronidazole and the vaginal yeast infection I will treat with Diflucan.  She can take 1 tablet now and repeat in 7 days when she is finished her antibiotic therapy.     Final Clinical Impressions(s) / UC Diagnoses   Final diagnoses:  BV (bacterial vaginosis)  Yeast vaginitis  Trichomonas vaginalis (TV) infection     Discharge Instructions      Take the Flagyl (metronidazole) 500 mg twice daily for treatment of your bacterial vaginosis.  Avoid alcohol while on the metronidazole as taken together will cause of vomiting.  Bacterial vaginosis is often caused by a imbalance of bacteria in your vaginal vault.  This is sometimes a result of using tampons or hormonal fluctuations during her menstrual cycle.  You if your symptoms are recurrent you can try using a boric acid suppository twice weekly to help maintain the acid-base balance in your vagina vault which could prevent further infection.  You can also try vaginal probiotics to help return normal bacterial balance.    The metronidazole will also treat your trichomonas infection, which is an STI.  Your testing for gonorrhea and chlamydia will be back tomorrow and if either of those is positive you will receive a phone call and be offered different antibiotics for their treatment.  For treatment of your vaginal yeast infection take 1 Diflucan tablet now and take a second dose after you have finished your antibiotic therapy.  Abstain from unprotected sexual intercourse until after your test results are back and you have completed treatment for all sexually transmitted infections.     ED Prescriptions     Medication Sig Dispense Auth. Provider   metroNIDAZOLE (FLAGYL) 500 MG tablet Take 1 tablet (500 mg total) by mouth 2 (two) times daily. 14 tablet Margarette Canada, NP   fluconazole (DIFLUCAN) 150 MG tablet Take 1 tablet (150 mg total) by mouth  daily for 2 doses. 2 tablet Margarette Canada, NP      PDMP not reviewed this encounter.   Margarette Canada, NP 03/12/22 1534

## 2022-03-12 NOTE — Discharge Instructions (Addendum)
Take the Flagyl (metronidazole) 500 mg twice daily for treatment of your bacterial vaginosis.  Avoid alcohol while on the metronidazole as taken together will cause of vomiting.  Bacterial vaginosis is often caused by a imbalance of bacteria in your vaginal vault.  This is sometimes a result of using tampons or hormonal fluctuations during her menstrual cycle.  You if your symptoms are recurrent you can try using a boric acid suppository twice weekly to help maintain the acid-base balance in your vagina vault which could prevent further infection.  You can also try vaginal probiotics to help return normal bacterial balance.   The metronidazole will also treat your trichomonas infection, which is an STI.  Your testing for gonorrhea and chlamydia will be back tomorrow and if either of those is positive you will receive a phone call and be offered different antibiotics for their treatment.  For treatment of your vaginal yeast infection take 1 Diflucan tablet now and take a second dose after you have finished your antibiotic therapy.  Abstain from unprotected sexual intercourse until after your test results are back and you have completed treatment for all sexually transmitted infections.

## 2022-03-15 DIAGNOSIS — F321 Major depressive disorder, single episode, moderate: Secondary | ICD-10-CM | POA: Diagnosis not present

## 2022-03-15 LAB — CERVICOVAGINAL ANCILLARY ONLY
Chlamydia: NEGATIVE
Comment: NEGATIVE
Comment: NORMAL
Neisseria Gonorrhea: NEGATIVE

## 2022-03-16 DIAGNOSIS — F321 Major depressive disorder, single episode, moderate: Secondary | ICD-10-CM | POA: Diagnosis not present

## 2022-03-22 DIAGNOSIS — F321 Major depressive disorder, single episode, moderate: Secondary | ICD-10-CM | POA: Diagnosis not present

## 2022-03-25 DIAGNOSIS — F321 Major depressive disorder, single episode, moderate: Secondary | ICD-10-CM | POA: Diagnosis not present

## 2022-03-29 DIAGNOSIS — Z5181 Encounter for therapeutic drug level monitoring: Secondary | ICD-10-CM | POA: Diagnosis not present

## 2022-03-29 DIAGNOSIS — Z79899 Other long term (current) drug therapy: Secondary | ICD-10-CM | POA: Diagnosis not present

## 2022-03-29 DIAGNOSIS — F3162 Bipolar disorder, current episode mixed, moderate: Secondary | ICD-10-CM | POA: Diagnosis not present

## 2022-03-30 DIAGNOSIS — F321 Major depressive disorder, single episode, moderate: Secondary | ICD-10-CM | POA: Diagnosis not present

## 2022-04-06 DIAGNOSIS — F321 Major depressive disorder, single episode, moderate: Secondary | ICD-10-CM | POA: Diagnosis not present

## 2022-04-07 DIAGNOSIS — Z419 Encounter for procedure for purposes other than remedying health state, unspecified: Secondary | ICD-10-CM | POA: Diagnosis not present

## 2022-04-08 DIAGNOSIS — F321 Major depressive disorder, single episode, moderate: Secondary | ICD-10-CM | POA: Diagnosis not present

## 2022-04-13 DIAGNOSIS — F321 Major depressive disorder, single episode, moderate: Secondary | ICD-10-CM | POA: Diagnosis not present

## 2022-04-14 DIAGNOSIS — F321 Major depressive disorder, single episode, moderate: Secondary | ICD-10-CM | POA: Diagnosis not present

## 2022-04-20 DIAGNOSIS — F321 Major depressive disorder, single episode, moderate: Secondary | ICD-10-CM | POA: Diagnosis not present

## 2022-04-21 DIAGNOSIS — F321 Major depressive disorder, single episode, moderate: Secondary | ICD-10-CM | POA: Diagnosis not present

## 2022-04-26 DIAGNOSIS — F3162 Bipolar disorder, current episode mixed, moderate: Secondary | ICD-10-CM | POA: Diagnosis not present

## 2022-04-26 DIAGNOSIS — Z5181 Encounter for therapeutic drug level monitoring: Secondary | ICD-10-CM | POA: Diagnosis not present

## 2022-04-26 DIAGNOSIS — Z79899 Other long term (current) drug therapy: Secondary | ICD-10-CM | POA: Diagnosis not present

## 2022-04-27 DIAGNOSIS — F321 Major depressive disorder, single episode, moderate: Secondary | ICD-10-CM | POA: Diagnosis not present

## 2022-04-27 DIAGNOSIS — F410 Panic disorder [episodic paroxysmal anxiety] without agoraphobia: Secondary | ICD-10-CM | POA: Diagnosis not present

## 2022-04-27 DIAGNOSIS — F4312 Post-traumatic stress disorder, chronic: Secondary | ICD-10-CM | POA: Diagnosis not present

## 2022-04-27 DIAGNOSIS — F901 Attention-deficit hyperactivity disorder, predominantly hyperactive type: Secondary | ICD-10-CM | POA: Diagnosis not present

## 2022-04-27 DIAGNOSIS — F3162 Bipolar disorder, current episode mixed, moderate: Secondary | ICD-10-CM | POA: Diagnosis not present

## 2022-04-27 DIAGNOSIS — F411 Generalized anxiety disorder: Secondary | ICD-10-CM | POA: Diagnosis not present

## 2022-04-28 DIAGNOSIS — F321 Major depressive disorder, single episode, moderate: Secondary | ICD-10-CM | POA: Diagnosis not present

## 2022-05-04 DIAGNOSIS — F321 Major depressive disorder, single episode, moderate: Secondary | ICD-10-CM | POA: Diagnosis not present

## 2022-05-05 DIAGNOSIS — F321 Major depressive disorder, single episode, moderate: Secondary | ICD-10-CM | POA: Diagnosis not present

## 2022-05-07 DIAGNOSIS — Z419 Encounter for procedure for purposes other than remedying health state, unspecified: Secondary | ICD-10-CM | POA: Diagnosis not present

## 2022-05-11 DIAGNOSIS — F321 Major depressive disorder, single episode, moderate: Secondary | ICD-10-CM | POA: Diagnosis not present

## 2022-05-12 DIAGNOSIS — F321 Major depressive disorder, single episode, moderate: Secondary | ICD-10-CM | POA: Diagnosis not present

## 2022-05-18 DIAGNOSIS — F321 Major depressive disorder, single episode, moderate: Secondary | ICD-10-CM | POA: Diagnosis not present

## 2022-05-19 DIAGNOSIS — F321 Major depressive disorder, single episode, moderate: Secondary | ICD-10-CM | POA: Diagnosis not present

## 2022-05-19 DIAGNOSIS — J301 Allergic rhinitis due to pollen: Secondary | ICD-10-CM | POA: Diagnosis not present

## 2022-05-20 DIAGNOSIS — J301 Allergic rhinitis due to pollen: Secondary | ICD-10-CM | POA: Diagnosis not present

## 2022-05-21 DIAGNOSIS — Z23 Encounter for immunization: Secondary | ICD-10-CM | POA: Diagnosis not present

## 2022-05-21 DIAGNOSIS — Z794 Long term (current) use of insulin: Secondary | ICD-10-CM | POA: Diagnosis not present

## 2022-05-21 DIAGNOSIS — J4520 Mild intermittent asthma, uncomplicated: Secondary | ICD-10-CM | POA: Diagnosis not present

## 2022-05-21 DIAGNOSIS — E119 Type 2 diabetes mellitus without complications: Secondary | ICD-10-CM | POA: Diagnosis not present

## 2022-05-21 DIAGNOSIS — Z13220 Encounter for screening for lipoid disorders: Secondary | ICD-10-CM | POA: Diagnosis not present

## 2022-05-26 DIAGNOSIS — Z5181 Encounter for therapeutic drug level monitoring: Secondary | ICD-10-CM | POA: Diagnosis not present

## 2022-05-26 DIAGNOSIS — F3162 Bipolar disorder, current episode mixed, moderate: Secondary | ICD-10-CM | POA: Diagnosis not present

## 2022-05-26 DIAGNOSIS — Z79899 Other long term (current) drug therapy: Secondary | ICD-10-CM | POA: Diagnosis not present

## 2022-05-26 DIAGNOSIS — F321 Major depressive disorder, single episode, moderate: Secondary | ICD-10-CM | POA: Diagnosis not present

## 2022-05-27 DIAGNOSIS — F321 Major depressive disorder, single episode, moderate: Secondary | ICD-10-CM | POA: Diagnosis not present

## 2022-06-03 DIAGNOSIS — F321 Major depressive disorder, single episode, moderate: Secondary | ICD-10-CM | POA: Diagnosis not present

## 2022-06-04 DIAGNOSIS — F321 Major depressive disorder, single episode, moderate: Secondary | ICD-10-CM | POA: Diagnosis not present

## 2022-06-07 DIAGNOSIS — Z419 Encounter for procedure for purposes other than remedying health state, unspecified: Secondary | ICD-10-CM | POA: Diagnosis not present

## 2022-06-23 DIAGNOSIS — Z79899 Other long term (current) drug therapy: Secondary | ICD-10-CM | POA: Diagnosis not present

## 2022-06-23 DIAGNOSIS — F3162 Bipolar disorder, current episode mixed, moderate: Secondary | ICD-10-CM | POA: Diagnosis not present

## 2022-06-23 DIAGNOSIS — Z5181 Encounter for therapeutic drug level monitoring: Secondary | ICD-10-CM | POA: Diagnosis not present

## 2022-06-29 DIAGNOSIS — F411 Generalized anxiety disorder: Secondary | ICD-10-CM | POA: Diagnosis not present

## 2022-06-29 DIAGNOSIS — F410 Panic disorder [episodic paroxysmal anxiety] without agoraphobia: Secondary | ICD-10-CM | POA: Diagnosis not present

## 2022-06-29 DIAGNOSIS — F901 Attention-deficit hyperactivity disorder, predominantly hyperactive type: Secondary | ICD-10-CM | POA: Diagnosis not present

## 2022-06-29 DIAGNOSIS — F4312 Post-traumatic stress disorder, chronic: Secondary | ICD-10-CM | POA: Diagnosis not present

## 2022-06-29 DIAGNOSIS — F3162 Bipolar disorder, current episode mixed, moderate: Secondary | ICD-10-CM | POA: Diagnosis not present

## 2022-07-08 DIAGNOSIS — Z419 Encounter for procedure for purposes other than remedying health state, unspecified: Secondary | ICD-10-CM | POA: Diagnosis not present

## 2022-08-06 DIAGNOSIS — Z419 Encounter for procedure for purposes other than remedying health state, unspecified: Secondary | ICD-10-CM | POA: Diagnosis not present

## 2022-08-06 DIAGNOSIS — Z5181 Encounter for therapeutic drug level monitoring: Secondary | ICD-10-CM | POA: Diagnosis not present

## 2022-08-06 DIAGNOSIS — Z79899 Other long term (current) drug therapy: Secondary | ICD-10-CM | POA: Diagnosis not present

## 2022-08-06 DIAGNOSIS — F3162 Bipolar disorder, current episode mixed, moderate: Secondary | ICD-10-CM | POA: Diagnosis not present

## 2022-08-19 ENCOUNTER — Encounter: Payer: Self-pay | Admitting: Podiatry

## 2022-08-19 ENCOUNTER — Ambulatory Visit: Payer: Medicaid Other | Admitting: Podiatry

## 2022-08-19 ENCOUNTER — Ambulatory Visit (INDEPENDENT_AMBULATORY_CARE_PROVIDER_SITE_OTHER): Payer: Medicaid Other | Admitting: Podiatry

## 2022-08-19 DIAGNOSIS — L600 Ingrowing nail: Secondary | ICD-10-CM

## 2022-08-19 NOTE — Patient Instructions (Signed)

## 2022-08-19 NOTE — Progress Notes (Signed)
  Subjective:  Patient ID: Yvonne Rios, female    DOB: [birth year],  MRN: [id number]  Chief Complaint  Patient presents with   Ingrown Toenail    Ingrown toenail on left foot 3rd toe lateral border    32 y.o. female presents with the above complaint. History confirmed with patient.  She has a similar issue now on the third toe  Objective:  Physical Exam: warm, good capillary refill, no trophic changes or ulcerative lesions, normal DP and PT pulses, and normal sensory exam. Left Foot: Ingrown lateral border third toe no paronychia Assessment:   1. Paronychia of toe of left foot due to ingrown toenail      Plan:  Patient was evaluated and treated and all questions answered.    Ingrown Nail, left -Patient elects to proceed with minor surgery to remove ingrown toenail today. Consent reviewed and signed by patient. -Ingrown nail excised. See procedure note. -Educated on post-procedure care including soaking. Written instructions provided and reviewed. -Again, A1c is too high for matricectomy at this point.  Once that is lower if she would like to proceed with this she will let me know  Procedure: Excision of Ingrown Toenail Location: Left third toe lateral nail borders. Anesthesia: Lidocaine 1% plain; 1.5 mL and Marcaine 0.5% plain; 1.5 mL, digital block. Skin Prep: Betadine. Dressing: Silvadene; telfa; dry, sterile, compression dressing. Technique: Following skin prep, the toe was exsanguinated and a tourniquet was secured at the base of the toe. The affected nail border was freed, split with a nail splitter, and excised. The tourniquet was then removed and sterile dressing applied. Disposition: Patient tolerated procedure well.   No follow-ups on file.

## 2022-08-30 DIAGNOSIS — J301 Allergic rhinitis due to pollen: Secondary | ICD-10-CM | POA: Diagnosis not present

## 2022-09-03 DIAGNOSIS — Z79899 Other long term (current) drug therapy: Secondary | ICD-10-CM | POA: Diagnosis not present

## 2022-09-03 DIAGNOSIS — Z5181 Encounter for therapeutic drug level monitoring: Secondary | ICD-10-CM | POA: Diagnosis not present

## 2022-09-03 DIAGNOSIS — F3162 Bipolar disorder, current episode mixed, moderate: Secondary | ICD-10-CM | POA: Diagnosis not present

## 2022-09-06 DIAGNOSIS — Z419 Encounter for procedure for purposes other than remedying health state, unspecified: Secondary | ICD-10-CM | POA: Diagnosis not present

## 2022-09-22 DIAGNOSIS — F4312 Post-traumatic stress disorder, chronic: Secondary | ICD-10-CM | POA: Diagnosis not present

## 2022-09-22 DIAGNOSIS — F3162 Bipolar disorder, current episode mixed, moderate: Secondary | ICD-10-CM | POA: Diagnosis not present

## 2022-09-22 DIAGNOSIS — F411 Generalized anxiety disorder: Secondary | ICD-10-CM | POA: Diagnosis not present

## 2022-09-22 DIAGNOSIS — F901 Attention-deficit hyperactivity disorder, predominantly hyperactive type: Secondary | ICD-10-CM | POA: Diagnosis not present

## 2022-09-22 DIAGNOSIS — F410 Panic disorder [episodic paroxysmal anxiety] without agoraphobia: Secondary | ICD-10-CM | POA: Diagnosis not present

## 2022-10-06 DIAGNOSIS — Z419 Encounter for procedure for purposes other than remedying health state, unspecified: Secondary | ICD-10-CM | POA: Diagnosis not present

## 2022-11-06 DIAGNOSIS — Z419 Encounter for procedure for purposes other than remedying health state, unspecified: Secondary | ICD-10-CM | POA: Diagnosis not present

## 2022-12-06 DIAGNOSIS — Z419 Encounter for procedure for purposes other than remedying health state, unspecified: Secondary | ICD-10-CM | POA: Diagnosis not present

## 2022-12-21 DIAGNOSIS — E119 Type 2 diabetes mellitus without complications: Secondary | ICD-10-CM | POA: Diagnosis not present

## 2022-12-21 DIAGNOSIS — J4540 Moderate persistent asthma, uncomplicated: Secondary | ICD-10-CM | POA: Diagnosis not present

## 2022-12-21 DIAGNOSIS — Z13220 Encounter for screening for lipoid disorders: Secondary | ICD-10-CM | POA: Diagnosis not present

## 2022-12-21 DIAGNOSIS — Z9109 Other allergy status, other than to drugs and biological substances: Secondary | ICD-10-CM | POA: Diagnosis not present

## 2022-12-21 DIAGNOSIS — Z794 Long term (current) use of insulin: Secondary | ICD-10-CM | POA: Diagnosis not present

## 2022-12-21 DIAGNOSIS — L089 Local infection of the skin and subcutaneous tissue, unspecified: Secondary | ICD-10-CM | POA: Diagnosis not present

## 2022-12-23 DIAGNOSIS — J301 Allergic rhinitis due to pollen: Secondary | ICD-10-CM | POA: Diagnosis not present

## 2022-12-27 DIAGNOSIS — F3162 Bipolar disorder, current episode mixed, moderate: Secondary | ICD-10-CM | POA: Diagnosis not present

## 2022-12-27 DIAGNOSIS — F4312 Post-traumatic stress disorder, chronic: Secondary | ICD-10-CM | POA: Diagnosis not present

## 2022-12-27 DIAGNOSIS — F411 Generalized anxiety disorder: Secondary | ICD-10-CM | POA: Diagnosis not present

## 2022-12-27 DIAGNOSIS — J301 Allergic rhinitis due to pollen: Secondary | ICD-10-CM | POA: Diagnosis not present

## 2022-12-27 DIAGNOSIS — F901 Attention-deficit hyperactivity disorder, predominantly hyperactive type: Secondary | ICD-10-CM | POA: Diagnosis not present

## 2022-12-27 DIAGNOSIS — F410 Panic disorder [episodic paroxysmal anxiety] without agoraphobia: Secondary | ICD-10-CM | POA: Diagnosis not present

## 2022-12-29 DIAGNOSIS — F3162 Bipolar disorder, current episode mixed, moderate: Secondary | ICD-10-CM | POA: Diagnosis not present

## 2022-12-29 DIAGNOSIS — Z5181 Encounter for therapeutic drug level monitoring: Secondary | ICD-10-CM | POA: Diagnosis not present

## 2022-12-29 DIAGNOSIS — Z79899 Other long term (current) drug therapy: Secondary | ICD-10-CM | POA: Diagnosis not present

## 2023-01-05 ENCOUNTER — Ambulatory Visit: Payer: Medicaid Other | Admitting: Podiatry

## 2023-01-05 ENCOUNTER — Encounter: Payer: Self-pay | Admitting: Podiatry

## 2023-01-05 DIAGNOSIS — L600 Ingrowing nail: Secondary | ICD-10-CM

## 2023-01-05 MED ORDER — DOXYCYCLINE HYCLATE 100 MG PO TABS: 100.0000 mg | ORAL_TABLET | Freq: Two times a day (BID) | ORAL | 0 refills | Status: AC

## 2023-01-05 NOTE — Progress Notes (Signed)
  Subjective:  Patient ID: Yvonne Rios, female    DOB: May 15, 1991,  MRN: [id number]  Chief Complaint  Patient presents with   Nail Problem    "It's this toe, I have an ingrown toenail." N - ingrown toenail L - 2nd right D - 2 weeks O - gradually worse C - throb, achy A - walking T - none    32 y.o. female presents with the above complaint. History confirmed with patient.  No dealing with it on the inside of the second toe on the right foot.  Remaining toenails previously worked on or doing okay  Objective:  Physical Exam: warm, good capillary refill, no trophic changes or ulcerative lesions, normal DP and PT pulses, and normal sensory exam. Right foot ingrown medial border second toe Assessment:   1. Ingrown nail of second toe of right foot       Plan:  Patient was evaluated and treated and all questions answered.    Ingrown Nail, left -Patient elects to proceed with minor surgery to remove ingrown toenail today. Consent reviewed and signed by patient. -Ingrown nail excised. See procedure note. -Educated on post-procedure care including soaking. Written instructions provided and reviewed. -Again, A1c is too high for matricectomy at this point.  Once that is lower if she would like to proceed with this she will let me know -5-day doxycycline sent to pharmacy  Procedure: Excision of Ingrown Toenail Location: Right second toe medial nail borders. Anesthesia: Lidocaine 1% plain; 1.5 mL and Marcaine 0.5% plain; 1.5 mL, digital block. Skin Prep: Betadine. Dressing: Silvadene; telfa; dry, sterile, compression dressing. Technique: Following skin prep, the toe was exsanguinated and a tourniquet was secured at the base of the toe. The affected nail border was freed, split with a nail splitter, and excised. The tourniquet was then removed and sterile dressing applied. Disposition: Patient tolerated procedure well.   No follow-ups on file.

## 2023-01-05 NOTE — Patient Instructions (Signed)
Soak Instructions ° ° ° °THE DAY AFTER THE PROCEDURE ° °Place 1/4 cup of epsom salts (or betadine, or white vinegar) in a quart of warm tap water.  Submerge your foot or feet with outer bandage intact for the initial soak; this will allow the bandage to become moist and wet for easy lift off.  Once you remove your bandage, continue to soak in the solution for 20 minutes.  This soak should be done twice a day.  Next, remove your foot or feet from solution, blot dry the affected area and cover.  You may use a band aid large enough to cover the area or use gauze and tape.  Apply other medications to the area as directed by the doctor such as polysporin neosporin. ° °IF YOUR SKIN BECOMES IRRITATED WHILE USING THESE INSTRUCTIONS, IT IS OKAY TO SWITCH TO  WHITE VINEGAR AND WATER. Or you may use antibacterial soap and water to keep the toe clean ° °Monitor for any signs/symptoms of infection. Call the office immediately if any occur or go directly to the emergency room. Call with any questions/concerns. ° ° °

## 2023-01-06 DIAGNOSIS — Z419 Encounter for procedure for purposes other than remedying health state, unspecified: Secondary | ICD-10-CM | POA: Diagnosis not present

## 2023-01-12 DIAGNOSIS — Z124 Encounter for screening for malignant neoplasm of cervix: Secondary | ICD-10-CM | POA: Diagnosis not present

## 2023-01-12 DIAGNOSIS — Z113 Encounter for screening for infections with a predominantly sexual mode of transmission: Secondary | ICD-10-CM | POA: Diagnosis not present

## 2023-01-12 DIAGNOSIS — T8332XA Displacement of intrauterine contraceptive device, initial encounter: Secondary | ICD-10-CM | POA: Diagnosis not present

## 2023-01-26 DIAGNOSIS — Z5181 Encounter for therapeutic drug level monitoring: Secondary | ICD-10-CM | POA: Diagnosis not present

## 2023-01-26 DIAGNOSIS — F3162 Bipolar disorder, current episode mixed, moderate: Secondary | ICD-10-CM | POA: Diagnosis not present

## 2023-01-26 DIAGNOSIS — Z79899 Other long term (current) drug therapy: Secondary | ICD-10-CM | POA: Diagnosis not present

## 2023-02-06 DIAGNOSIS — Z419 Encounter for procedure for purposes other than remedying health state, unspecified: Secondary | ICD-10-CM | POA: Diagnosis not present

## 2023-02-25 DIAGNOSIS — F901 Attention-deficit hyperactivity disorder, predominantly hyperactive type: Secondary | ICD-10-CM | POA: Diagnosis not present

## 2023-02-25 DIAGNOSIS — F3162 Bipolar disorder, current episode mixed, moderate: Secondary | ICD-10-CM | POA: Diagnosis not present

## 2023-02-25 DIAGNOSIS — F4312 Post-traumatic stress disorder, chronic: Secondary | ICD-10-CM | POA: Diagnosis not present

## 2023-02-25 DIAGNOSIS — F411 Generalized anxiety disorder: Secondary | ICD-10-CM | POA: Diagnosis not present

## 2023-02-25 DIAGNOSIS — F410 Panic disorder [episodic paroxysmal anxiety] without agoraphobia: Secondary | ICD-10-CM | POA: Diagnosis not present

## 2023-02-28 DIAGNOSIS — F3162 Bipolar disorder, current episode mixed, moderate: Secondary | ICD-10-CM | POA: Diagnosis not present

## 2023-03-08 DIAGNOSIS — Z419 Encounter for procedure for purposes other than remedying health state, unspecified: Secondary | ICD-10-CM | POA: Diagnosis not present

## 2023-03-23 ENCOUNTER — Ambulatory Visit
Admission: RE | Admit: 2023-03-23 | Discharge: 2023-03-23 | Disposition: A | Discharge status: Home / Self Care | Payer: Medicaid Other | Source: Ambulatory Visit | Attending: Physician Assistant | Admitting: Physician Assistant

## 2023-03-23 VITALS — BP 120/69 | HR 72 | Temp 98.9°F | Resp 17

## 2023-03-23 DIAGNOSIS — N898 Other specified noninflammatory disorders of vagina: Secondary | ICD-10-CM

## 2023-03-23 DIAGNOSIS — B9689 Other specified bacterial agents as the cause of diseases classified elsewhere: Secondary | ICD-10-CM | POA: Diagnosis not present

## 2023-03-23 DIAGNOSIS — J301 Allergic rhinitis due to pollen: Secondary | ICD-10-CM | POA: Diagnosis not present

## 2023-03-23 DIAGNOSIS — N760 Acute vaginitis: Secondary | ICD-10-CM

## 2023-03-23 LAB — WET PREP, GENITAL
Clue Cells Wet Prep HPF POC: NONE SEEN
Sperm: NONE SEEN
Trich, Wet Prep: NONE SEEN
WBC, Wet Prep HPF POC: >=10 — AB (ref <10)
Yeast Wet Prep HPF POC: NONE SEEN

## 2023-03-23 MED ORDER — METRONIDAZOLE 500 MG PO TABS: 500.0000 mg | ORAL_TABLET | Freq: Two times a day (BID) | ORAL | 0 refills | Status: AC

## 2023-03-23 NOTE — Discharge Instructions (Signed)
The most common types of vaginal infections are yeast infections and bacterial vaginosis. Neither of which are really considered to be sexually transmitted. Often a pH swab or wet prep is performed and if abnormal may reveal either type of infection. Begin metronidazole if prescribed for possible BV infection. If there is concern for yeast infection, fluconazole is often prescribed . Take this as directed. You may also apply topical miconazole (can be purchased OTC) externally for relief of itching. Increase rest and fluid intake. If labs sent out, we will call within 2-5 days with results and amend treatment if necessary. Always try to use pH balanced washes/wipes, urinate after intercourse, stay hydrated, and take probiotics if you are prone to vaginal infections. Return or see PCP or gynecologist for new/worsening infections.    If you are still having symptoms after 1 week of treatment, seek re-evaluation.

## 2023-03-23 NOTE — ED Triage Notes (Signed)
Patient states that she has had vaginal itching-burning and discharge x 2 weeks. Fishy smell.

## 2023-03-23 NOTE — ED Provider Notes (Signed)
[facility]    CSN: [id number] Arrival date & time: 03/23/23  1116      History   Chief Complaint Chief Complaint  Patient presents with   Vaginal Discharge    I'm having vaginal discharge fishy smell and itching - Entered by patient    HPI Yvonne Rios is a 32 y.o. female presenting for approximately 2-week history of vaginal itching/burning and discharge with a fishy odor.  Patient reports history of BV and believes she has BV.  She denies any concern for STIs and declines testing.  No report of any urinary symptoms, abdominal/pelvic pain.  Has an IUD.  HPI  Past Medical History:  Diagnosis Date   ADD (attention deficit disorder)    Anxiety    Asthma    Depression    Diabetes mellitus without complication (HCC)    GERD (gastroesophageal reflux disease)    IBS (irritable bowel syndrome)    Migraine    MRSA (methicillin resistant staph aureus) culture positive    Oligomenorrhea    Pyloric stenosis    UTI (urinary tract infection)     Patient Active Problem List   Diagnosis Date Noted   Encounter for postpartum care and examination after delivery 07/08/2020   Care and examination of lactating mother 07/08/2020   Uncontrolled type 2 diabetes mellitus with hyperglycemia (HCC) 04/04/2020   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   Attention deficit disorder (ADD) in adult 09/28/2019   Myopia, left eye 12/07/2017   Migraine without status migrainosus, not intractable 11/19/2015   Morbid obesity due to excess calories (HCC) 11/19/2015   Environmental allergies 03/17/2015   Asthma 11/21/2014   Mild intermittent asthma without complication 10/15/2014   Left-sided low back pain with sciatica 08/09/2014   Insomnia 11/20/2013   Functional constipation 07/03/2013   GERD (gastroesophageal reflux disease) 07/03/2013   Anxiety 05/16/2013   Depression 05/16/2013    Past Surgical History:  Procedure Laterality Date   COLPOSCOPY     DILATION AND CURETTAGE OF  UTERUS  age 76   HYSTEROSCOPY  2   TONSILLECTOMY     WISDOM TOOTH EXTRACTION      OB History     Gravida  4   Para  2   Term  2   Preterm      AB  2   Living  2      SAB  2   IAB      Ectopic      Multiple  0   Live Births  2            Home Medications    Prior to Admission medications   Medication Sig Start Date End Date Taking? Authorizing Provider  ADDERALL XR 20 MG 24 hr capsule Take 1 capsule (20 mg total) by mouth 2 (two) times daily. 06/17/20  Yes Vena Austria, MD  albuterol (PROVENTIL HFA;VENTOLIN HFA) 108 (90 Base) MCG/ACT inhaler Inhale 2 puffs into the lungs every 4 (four) hours as needed for wheezing or shortness of breath. 05/01/17  Yes Domenick Gong, MD  Blood Glucose Monitoring Suppl (ACCU-CHEK AVIVA PLUS) w/Device KIT 1 Units by Does not apply route in the morning, at noon, in the evening, and at bedtime. 01/07/20  Yes Vena Austria, MD  clonazePAM (KLONOPIN) 0.5 MG tablet Take 1 tablet (0.5 mg total) by mouth 2 (two) times daily as needed for anxiety. TAKE 1/2 TABLET BY MOUTH EVERY MORNING, TAKE 1/2 TABLET BY MOUTH EVERY AFTERNOON & TAKE ONE TABLET BY  MOUTH AT BEDTIME 06/17/20  Yes Vena Austria, MD  doxycycline (VIBRA-TABS) 100 MG tablet Take 1 tablet (100 mg total) by mouth 2 (two) times daily. 01/05/23  Yes McDonald, Adam R, DPM  glucose blood test strip Use as instructed 01/07/20  Yes Vena Austria, MD  metFORMIN (GLUCOPHAGE) 500 MG tablet Take 1 tablet (500 mg total) by mouth 4 (four) times daily. 07/08/20  Yes Veal, Katelyn, CNM  metroNIDAZOLE (FLAGYL) 500 MG tablet Take 1 tablet (500 mg total) by mouth 2 (two) times daily for 7 days. 03/23/23 03/30/23 Yes Shirlee Latch, PA-C  SYMBICORT 160-4.5 MCG/ACT inhaler Inhale 2 puffs into the lungs in the morning and at bedtime. 12/05/19  Yes [provider]  acetaminophen (TYLENOL) 325 MG tablet Take 2 tablets (650 mg total) by mouth every 4 (four) hours as needed (for pain  scale < 4). 07/08/20   Zipporah Plants, CNM  EPINEPHrine 0.3 mg/0.3 mL IJ SOAJ injection USE AS DIRECTED PER MD FOR ANAPHYLAXIS 06/09/19   [provider]  famotidine (PEPCID) 20 MG tablet Take 1 tablet (20 mg total) by mouth daily. 12/28/19 03/12/21  Vena Austria, MD  fluticasone (FLONASE) 50 MCG/ACT nasal spray Place 2 sprays into both nostrils daily. 12/30/21   Domenick Gong, MD  Fluticasone-Salmeterol (ADVAIR) 250-50 MCG/DOSE AEPB Inhale 1 puff into the lungs daily. 12/28/19   Vena Austria, MD  folic acid (FOLVITE) 1 MG tablet Take 1 tablet (1 mg total) by mouth daily. 01/25/20   Vena Austria, MD  glipiZIDE (GLUCOTROL XL) 10 MG 24 hr tablet Take 1 tablet (10 mg total) by mouth daily with breakfast. 07/09/20   Zipporah Plants, CNM  ibuprofen (ADVIL) 600 MG tablet Take 1 tablet (600 mg total) by mouth every 6 (six) hours as needed. 12/30/21   Domenick Gong, MD  metoCLOPramide (REGLAN) 10 MG tablet Take 1 tablet (10 mg total) by mouth 3 (three) times daily before meals. 08/15/20   Vena Austria, MD  montelukast (SINGULAIR) 10 MG tablet Take 1 tablet (10 mg total) by mouth at bedtime. 12/28/19 12/27/20  Vena Austria, MD  pantoprazole (PROTONIX) 40 MG tablet Take 1 tablet (40 mg total) by mouth daily. 12/28/19 12/27/20  Vena Austria, MD  Prenatal Vit-Fe Fumarate-FA (PRENATAL MULTIVITAMIN) TABS tablet Take 1 tablet by mouth daily at 12 noon. 07/08/20   Zipporah Plants, CNM  sertraline (ZOLOFT) 50 MG tablet Take 1 tablet (50 mg total) by mouth daily. 12/28/19   Vena Austria, MD  Spacer/Aero-Holding Chambers (AEROCHAMBER PLUS) inhaler Use as instructed 05/01/17   Domenick Gong, MD    Family History Family History  Problem Relation Age of Onset   Ovarian cancer Mother 5   Other Father     Social History Social History   Tobacco Use   Smoking status: Former   Smokeless tobacco: Never   Tobacco comments:    smoked as teen  [employer]vertising account [employer]   Vaping status: Never Used   Substance Use Topics   Alcohol use: Not Currently    Alcohol/week: 0.0 standard drinks of alcohol    Comment: rarely   Drug use: No     Allergies   Cefprozil, Amoxil [amoxicillin], and Augmentin [amoxicillin-pot clavulanate]   Review of Systems Review of Systems  Constitutional:  Negative for fatigue and fever.  Gastrointestinal:  Negative for abdominal pain.  Genitourinary:  Positive for vaginal discharge. Negative for dysuria, flank pain, frequency, hematuria, urgency, vaginal bleeding and vaginal pain.  Musculoskeletal:  Negative for back pain.  Skin:  Negative  for rash.     Physical Exam Triage Vital Signs ED Triage Vitals [03/23/23 1150]  Encounter Vitals Group     BP 120/69     Systolic BP Percentile      Diastolic BP Percentile      Pulse Rate 72     Resp 17     Temp 98.9 F (37.2 C)     Temp Source Oral     SpO2 99 %     Weight      Height      Head Circumference      Peak Flow      Pain Score      Pain Loc      Pain Education      Exclude from Growth Chart    No data found.  Updated Vital Signs BP 120/69 (BP Location: Left Arm)   Pulse 72   Temp 98.9 F (37.2 C) (Oral)   Resp 17   SpO2 99%    Physical Exam Vitals and nursing note reviewed.  Constitutional:      General: She is not in acute distress.    Appearance: Normal appearance. She is not ill-appearing or toxic-appearing.  HENT:     Head: Normocephalic and atraumatic.  Eyes:     General: No scleral icterus.       Right eye: No discharge.        Left eye: No discharge.     Conjunctiva/sclera: Conjunctivae normal.  Cardiovascular:     Rate and Rhythm: Normal rate and regular rhythm.     Heart sounds: Normal heart sounds.  Pulmonary:     Effort: Pulmonary effort is normal. No respiratory distress.     Breath sounds: Normal breath sounds.  Abdominal:     Palpations: Abdomen is soft.     Tenderness: There is no abdominal tenderness.  Musculoskeletal:     Cervical back: Neck  supple.  Skin:    General: Skin is dry.  Neurological:     General: No focal deficit present.     Mental Status: She is alert. Mental status is at baseline.     Motor: No weakness.     Gait: Gait normal.  Psychiatric:        Mood and Affect: Mood normal.        Behavior: Behavior normal.        Thought Content: Thought content normal.      UC Treatments / Results  Labs (all labs ordered are listed, but only abnormal results are displayed) Labs Reviewed  WET PREP, GENITAL - Abnormal; Notable for the following components:      Result Value   WBC, Wet Prep HPF POC >=10 (*)    All other components within normal limits    EKG   Radiology No results found.  Procedures Procedures (including critical care time)  Medications Ordered in UC Medications - No data to display  Initial Impression / Assessment and Plan / UC Course  I have reviewed the triage vital signs and the nursing notes.  Pertinent labs & imaging results that were available during my care of the patient were reviewed by me and considered in my medical decision making (see chart for details).   32 year old female with history of BV presents for fishy vaginal discharge and itching for the past 2 weeks.  Denies concern for STIs and declines testing.  Her partner is present with her.  Patient elects to forego pelvic exam performed vaginal self swab.  Wet prep is negative.  Discussed this with her.  We discussed the possibility that this could still be BV despite the negative swab.  She would like to be treated for this infection so have sent metronidazole to pharmacy.  Good hygiene and supportive care.  Advised reevaluation if no improvement over the next week.   Final Clinical Impressions(s) / UC Diagnoses   Final diagnoses:  BV (bacterial vaginosis)  Vaginal discharge     Discharge Instructions      The most common types of vaginal infections are yeast infections and bacterial vaginosis. Neither of which  are really considered to be sexually transmitted. Often a pH swab or wet prep is performed and if abnormal may reveal either type of infection. Begin metronidazole if prescribed for possible BV infection. If there is concern for yeast infection, fluconazole is often prescribed . Take this as directed. You may also apply topical miconazole (can be purchased OTC) externally for relief of itching. Increase rest and fluid intake. If labs sent out, we will call within 2-5 days with results and amend treatment if necessary. Always try to use pH balanced washes/wipes, urinate after intercourse, stay hydrated, and take probiotics if you are prone to vaginal infections. Return or see PCP or gynecologist for new/worsening infections.    If you are still having symptoms after 1 week of treatment, seek re-evaluation.     ED Prescriptions     Medication Sig Dispense Auth. Provider   metroNIDAZOLE (FLAGYL) 500 MG tablet Take 1 tablet (500 mg total) by mouth 2 (two) times daily for 7 days. 14 tablet Gareth Morgan      PDMP not reviewed this encounter.   Shirlee Latch, PA-C 03/23/23 1246

## 2023-03-24 DIAGNOSIS — J301 Allergic rhinitis due to pollen: Secondary | ICD-10-CM | POA: Diagnosis not present

## 2023-03-30 DIAGNOSIS — Z794 Long term (current) use of insulin: Secondary | ICD-10-CM | POA: Diagnosis not present

## 2023-03-30 DIAGNOSIS — Z6841 Body Mass Index (BMI) 40.0 and over, adult: Secondary | ICD-10-CM | POA: Diagnosis not present

## 2023-03-30 DIAGNOSIS — E119 Type 2 diabetes mellitus without complications: Secondary | ICD-10-CM | POA: Diagnosis not present

## 2023-03-30 DIAGNOSIS — L089 Local infection of the skin and subcutaneous tissue, unspecified: Secondary | ICD-10-CM | POA: Diagnosis not present

## 2023-03-30 DIAGNOSIS — Z23 Encounter for immunization: Secondary | ICD-10-CM | POA: Diagnosis not present

## 2023-03-30 DIAGNOSIS — K219 Gastro-esophageal reflux disease without esophagitis: Secondary | ICD-10-CM | POA: Diagnosis not present

## 2023-03-30 DIAGNOSIS — J4540 Moderate persistent asthma, uncomplicated: Secondary | ICD-10-CM | POA: Diagnosis not present

## 2023-04-01 DIAGNOSIS — F901 Attention-deficit hyperactivity disorder, predominantly hyperactive type: Secondary | ICD-10-CM | POA: Diagnosis not present

## 2023-04-01 DIAGNOSIS — F3162 Bipolar disorder, current episode mixed, moderate: Secondary | ICD-10-CM | POA: Diagnosis not present

## 2023-04-01 DIAGNOSIS — Z5181 Encounter for therapeutic drug level monitoring: Secondary | ICD-10-CM | POA: Diagnosis not present

## 2023-04-01 DIAGNOSIS — F410 Panic disorder [episodic paroxysmal anxiety] without agoraphobia: Secondary | ICD-10-CM | POA: Diagnosis not present

## 2023-04-01 DIAGNOSIS — F411 Generalized anxiety disorder: Secondary | ICD-10-CM | POA: Diagnosis not present

## 2023-04-01 DIAGNOSIS — F4312 Post-traumatic stress disorder, chronic: Secondary | ICD-10-CM | POA: Diagnosis not present

## 2023-04-08 DIAGNOSIS — H6123 Impacted cerumen, bilateral: Secondary | ICD-10-CM | POA: Diagnosis not present

## 2023-04-08 DIAGNOSIS — Z419 Encounter for procedure for purposes other than remedying health state, unspecified: Secondary | ICD-10-CM | POA: Diagnosis not present

## 2023-04-08 DIAGNOSIS — J301 Allergic rhinitis due to pollen: Secondary | ICD-10-CM | POA: Diagnosis not present

## 2023-04-08 DIAGNOSIS — J329 Chronic sinusitis, unspecified: Secondary | ICD-10-CM | POA: Diagnosis not present

## 2023-04-20 DIAGNOSIS — N898 Other specified noninflammatory disorders of vagina: Secondary | ICD-10-CM | POA: Diagnosis not present

## 2023-04-20 DIAGNOSIS — Z202 Contact with and (suspected) exposure to infections with a predominantly sexual mode of transmission: Secondary | ICD-10-CM | POA: Diagnosis not present

## 2023-04-29 DIAGNOSIS — F411 Generalized anxiety disorder: Secondary | ICD-10-CM | POA: Diagnosis not present

## 2023-04-29 DIAGNOSIS — F410 Panic disorder [episodic paroxysmal anxiety] without agoraphobia: Secondary | ICD-10-CM | POA: Diagnosis not present

## 2023-04-29 DIAGNOSIS — F901 Attention-deficit hyperactivity disorder, predominantly hyperactive type: Secondary | ICD-10-CM | POA: Diagnosis not present

## 2023-04-29 DIAGNOSIS — F4312 Post-traumatic stress disorder, chronic: Secondary | ICD-10-CM | POA: Diagnosis not present

## 2023-04-29 DIAGNOSIS — Z5181 Encounter for therapeutic drug level monitoring: Secondary | ICD-10-CM | POA: Diagnosis not present

## 2023-04-29 DIAGNOSIS — F3162 Bipolar disorder, current episode mixed, moderate: Secondary | ICD-10-CM | POA: Diagnosis not present

## 2023-05-08 DIAGNOSIS — Z419 Encounter for procedure for purposes other than remedying health state, unspecified: Secondary | ICD-10-CM | POA: Diagnosis not present

## 2023-05-25 DIAGNOSIS — F411 Generalized anxiety disorder: Secondary | ICD-10-CM | POA: Diagnosis not present

## 2023-05-25 DIAGNOSIS — F4312 Post-traumatic stress disorder, chronic: Secondary | ICD-10-CM | POA: Diagnosis not present

## 2023-05-25 DIAGNOSIS — F3162 Bipolar disorder, current episode mixed, moderate: Secondary | ICD-10-CM | POA: Diagnosis not present

## 2023-05-25 DIAGNOSIS — F901 Attention-deficit hyperactivity disorder, predominantly hyperactive type: Secondary | ICD-10-CM | POA: Diagnosis not present

## 2023-05-25 DIAGNOSIS — F410 Panic disorder [episodic paroxysmal anxiety] without agoraphobia: Secondary | ICD-10-CM | POA: Diagnosis not present

## 2023-06-06 DIAGNOSIS — F410 Panic disorder [episodic paroxysmal anxiety] without agoraphobia: Secondary | ICD-10-CM | POA: Diagnosis not present

## 2023-06-06 DIAGNOSIS — F3162 Bipolar disorder, current episode mixed, moderate: Secondary | ICD-10-CM | POA: Diagnosis not present

## 2023-06-06 DIAGNOSIS — F4312 Post-traumatic stress disorder, chronic: Secondary | ICD-10-CM | POA: Diagnosis not present

## 2023-06-06 DIAGNOSIS — Z5181 Encounter for therapeutic drug level monitoring: Secondary | ICD-10-CM | POA: Diagnosis not present

## 2023-06-06 DIAGNOSIS — F901 Attention-deficit hyperactivity disorder, predominantly hyperactive type: Secondary | ICD-10-CM | POA: Diagnosis not present

## 2023-06-08 DIAGNOSIS — Z419 Encounter for procedure for purposes other than remedying health state, unspecified: Secondary | ICD-10-CM | POA: Diagnosis not present

## 2023-06-27 DIAGNOSIS — J301 Allergic rhinitis due to pollen: Secondary | ICD-10-CM | POA: Diagnosis not present

## 2023-07-04 DIAGNOSIS — J301 Allergic rhinitis due to pollen: Secondary | ICD-10-CM | POA: Diagnosis not present

## 2023-07-05 DIAGNOSIS — F4312 Post-traumatic stress disorder, chronic: Secondary | ICD-10-CM | POA: Diagnosis not present

## 2023-07-05 DIAGNOSIS — F410 Panic disorder [episodic paroxysmal anxiety] without agoraphobia: Secondary | ICD-10-CM | POA: Diagnosis not present

## 2023-07-05 DIAGNOSIS — F411 Generalized anxiety disorder: Secondary | ICD-10-CM | POA: Diagnosis not present

## 2023-07-05 DIAGNOSIS — F3162 Bipolar disorder, current episode mixed, moderate: Secondary | ICD-10-CM | POA: Diagnosis not present

## 2023-07-05 DIAGNOSIS — Z5181 Encounter for therapeutic drug level monitoring: Secondary | ICD-10-CM | POA: Diagnosis not present

## 2023-07-05 DIAGNOSIS — F901 Attention-deficit hyperactivity disorder, predominantly hyperactive type: Secondary | ICD-10-CM | POA: Diagnosis not present

## 2023-07-08 DIAGNOSIS — F3161 Bipolar disorder, current episode mixed, mild: Secondary | ICD-10-CM | POA: Diagnosis not present

## 2023-07-08 DIAGNOSIS — F4310 Post-traumatic stress disorder, unspecified: Secondary | ICD-10-CM | POA: Diagnosis not present

## 2023-07-08 DIAGNOSIS — E119 Type 2 diabetes mellitus without complications: Secondary | ICD-10-CM | POA: Diagnosis not present

## 2023-07-08 DIAGNOSIS — J4520 Mild intermittent asthma, uncomplicated: Secondary | ICD-10-CM | POA: Diagnosis not present

## 2023-07-08 DIAGNOSIS — Z794 Long term (current) use of insulin: Secondary | ICD-10-CM | POA: Diagnosis not present

## 2023-07-09 DIAGNOSIS — Z419 Encounter for procedure for purposes other than remedying health state, unspecified: Secondary | ICD-10-CM | POA: Diagnosis not present

## 2023-07-11 DIAGNOSIS — F411 Generalized anxiety disorder: Secondary | ICD-10-CM | POA: Diagnosis not present

## 2023-07-12 DIAGNOSIS — F411 Generalized anxiety disorder: Secondary | ICD-10-CM | POA: Diagnosis not present

## 2023-07-15 DIAGNOSIS — F411 Generalized anxiety disorder: Secondary | ICD-10-CM | POA: Diagnosis not present

## 2023-07-16 DIAGNOSIS — F411 Generalized anxiety disorder: Secondary | ICD-10-CM | POA: Diagnosis not present

## 2023-07-18 DIAGNOSIS — F411 Generalized anxiety disorder: Secondary | ICD-10-CM | POA: Diagnosis not present

## 2023-07-19 DIAGNOSIS — F411 Generalized anxiety disorder: Secondary | ICD-10-CM | POA: Diagnosis not present

## 2023-07-20 DIAGNOSIS — F411 Generalized anxiety disorder: Secondary | ICD-10-CM | POA: Diagnosis not present

## 2023-07-21 DIAGNOSIS — F411 Generalized anxiety disorder: Secondary | ICD-10-CM | POA: Diagnosis not present

## 2023-07-23 DIAGNOSIS — F411 Generalized anxiety disorder: Secondary | ICD-10-CM | POA: Diagnosis not present

## 2023-07-27 DIAGNOSIS — F411 Generalized anxiety disorder: Secondary | ICD-10-CM | POA: Diagnosis not present

## 2023-07-29 DIAGNOSIS — F411 Generalized anxiety disorder: Secondary | ICD-10-CM | POA: Diagnosis not present

## 2023-07-31 DIAGNOSIS — F411 Generalized anxiety disorder: Secondary | ICD-10-CM | POA: Diagnosis not present

## 2023-08-03 DIAGNOSIS — F419 Anxiety disorder, unspecified: Secondary | ICD-10-CM | POA: Diagnosis not present

## 2023-08-06 DIAGNOSIS — Z419 Encounter for procedure for purposes other than remedying health state, unspecified: Secondary | ICD-10-CM | POA: Diagnosis not present

## 2023-08-07 DIAGNOSIS — F411 Generalized anxiety disorder: Secondary | ICD-10-CM | POA: Diagnosis not present

## 2023-08-08 DIAGNOSIS — F411 Generalized anxiety disorder: Secondary | ICD-10-CM | POA: Diagnosis not present

## 2023-08-09 DIAGNOSIS — F411 Generalized anxiety disorder: Secondary | ICD-10-CM | POA: Diagnosis not present

## 2023-08-10 DIAGNOSIS — F411 Generalized anxiety disorder: Secondary | ICD-10-CM | POA: Diagnosis not present

## 2023-08-12 DIAGNOSIS — F411 Generalized anxiety disorder: Secondary | ICD-10-CM | POA: Diagnosis not present

## 2023-08-15 DIAGNOSIS — F411 Generalized anxiety disorder: Secondary | ICD-10-CM | POA: Diagnosis not present

## 2023-08-17 ENCOUNTER — Encounter: Payer: Self-pay | Admitting: Podiatry

## 2023-08-17 ENCOUNTER — Ambulatory Visit: Admitting: Podiatry

## 2023-08-17 DIAGNOSIS — L600 Ingrowing nail: Secondary | ICD-10-CM

## 2023-08-17 DIAGNOSIS — F411 Generalized anxiety disorder: Secondary | ICD-10-CM | POA: Diagnosis not present

## 2023-08-17 MED ORDER — NEOMYCIN-POLYMYXIN-HC 3.5-10000-1 OT SUSP: OTIC | 0 refills | Status: AC

## 2023-08-17 NOTE — Patient Instructions (Signed)

## 2023-08-18 ENCOUNTER — Telehealth: Payer: Self-pay | Admitting: Podiatry

## 2023-08-18 NOTE — Progress Notes (Signed)
  Subjective:  Patient ID: Yvonne Rios, female    DOB: [birth year],  MRN: [id number]  Chief Complaint  Patient presents with   Nail Problem    "I've got an ingrown toenail on my left big toe and the toe beside it."    33 y.o. female presents with the above complaint. History confirmed with patient.  She has new ingrown nails on the lateral border of the left great toe and medial border of the left second toe  Objective:  Physical Exam: warm, good capillary refill, no trophic changes or ulcerative lesions, normal DP and PT pulses, normal sensory exam, and ingrown lateral hallux and medial second toe border left foot no paronychia.  Assessment:   1. Ingrown nail      Plan:  Patient was evaluated and treated and all questions answered.    Ingrown Nail, left -Patient elects to proceed with minor surgery to remove ingrown toenail today. Consent reviewed and signed by patient. -Ingrown nail excised. See procedure note. -Educated on post-procedure care including soaking. Written instructions provided and reviewed. -Rx for Cortisporin sent to pharmacy. -Advised on signs and symptoms of infection developing.  We discussed that the phenol likely will create some redness and edema and tenderness around the nailbed as long as it is localized this is to be expected.  Will return as needed if any infection signs develop  Procedure: Excision of Ingrown Toenail Location: Left first lateral and second medial nail borders. Anesthesia: Lidocaine 1% plain; 1.5 mL and Marcaine 0.5% plain; 1.5 mL, digital block. Skin Prep: Betadine. Dressing: Silvadene; telfa; dry, sterile, compression dressing. Technique: Following skin prep, the toe was exsanguinated and a tourniquet was secured at the base of the toe. The affected nail border was freed, split with a nail splitter, and excised. Chemical matrixectomy was then performed with phenol and irrigated out with alcohol. The tourniquet was then  removed and sterile dressing applied. Disposition: Patient tolerated procedure well.    Return if symptoms worsen or fail to improve.

## 2023-08-18 NOTE — Telephone Encounter (Signed)
 Pharmacy called asking if they could switch the medication from suspension to solution on the cortiporin Please call pharmacy and advise. They stated they also called yesterday afternoon.

## 2023-08-22 DIAGNOSIS — F411 Generalized anxiety disorder: Secondary | ICD-10-CM | POA: Diagnosis not present

## 2023-08-24 DIAGNOSIS — F411 Generalized anxiety disorder: Secondary | ICD-10-CM | POA: Diagnosis not present

## 2023-08-30 DIAGNOSIS — Z5181 Encounter for therapeutic drug level monitoring: Secondary | ICD-10-CM | POA: Diagnosis not present

## 2023-08-30 DIAGNOSIS — F411 Generalized anxiety disorder: Secondary | ICD-10-CM | POA: Diagnosis not present

## 2023-08-30 DIAGNOSIS — F410 Panic disorder [episodic paroxysmal anxiety] without agoraphobia: Secondary | ICD-10-CM | POA: Diagnosis not present

## 2023-08-30 DIAGNOSIS — F3162 Bipolar disorder, current episode mixed, moderate: Secondary | ICD-10-CM | POA: Diagnosis not present

## 2023-08-30 DIAGNOSIS — F901 Attention-deficit hyperactivity disorder, predominantly hyperactive type: Secondary | ICD-10-CM | POA: Diagnosis not present

## 2023-08-30 DIAGNOSIS — F4312 Post-traumatic stress disorder, chronic: Secondary | ICD-10-CM | POA: Diagnosis not present

## 2023-08-31 DIAGNOSIS — F901 Attention-deficit hyperactivity disorder, predominantly hyperactive type: Secondary | ICD-10-CM | POA: Diagnosis not present

## 2023-08-31 DIAGNOSIS — F4312 Post-traumatic stress disorder, chronic: Secondary | ICD-10-CM | POA: Diagnosis not present

## 2023-08-31 DIAGNOSIS — F3162 Bipolar disorder, current episode mixed, moderate: Secondary | ICD-10-CM | POA: Diagnosis not present

## 2023-08-31 DIAGNOSIS — F411 Generalized anxiety disorder: Secondary | ICD-10-CM | POA: Diagnosis not present

## 2023-08-31 DIAGNOSIS — F410 Panic disorder [episodic paroxysmal anxiety] without agoraphobia: Secondary | ICD-10-CM | POA: Diagnosis not present

## 2023-09-01 DIAGNOSIS — F411 Generalized anxiety disorder: Secondary | ICD-10-CM | POA: Diagnosis not present

## 2023-09-06 DIAGNOSIS — F411 Generalized anxiety disorder: Secondary | ICD-10-CM | POA: Diagnosis not present

## 2023-09-08 DIAGNOSIS — F411 Generalized anxiety disorder: Secondary | ICD-10-CM | POA: Diagnosis not present

## 2023-09-11 DIAGNOSIS — F411 Generalized anxiety disorder: Secondary | ICD-10-CM | POA: Diagnosis not present

## 2023-09-15 DIAGNOSIS — F411 Generalized anxiety disorder: Secondary | ICD-10-CM | POA: Diagnosis not present

## 2023-09-17 DIAGNOSIS — Z419 Encounter for procedure for purposes other than remedying health state, unspecified: Secondary | ICD-10-CM | POA: Diagnosis not present

## 2023-09-19 DIAGNOSIS — F411 Generalized anxiety disorder: Secondary | ICD-10-CM | POA: Diagnosis not present

## 2023-09-20 DIAGNOSIS — F411 Generalized anxiety disorder: Secondary | ICD-10-CM | POA: Diagnosis not present

## 2023-09-21 DIAGNOSIS — F411 Generalized anxiety disorder: Secondary | ICD-10-CM | POA: Diagnosis not present

## 2023-09-22 DIAGNOSIS — Z30431 Encounter for routine checking of intrauterine contraceptive device: Secondary | ICD-10-CM | POA: Diagnosis not present

## 2023-09-22 DIAGNOSIS — N83201 Unspecified ovarian cyst, right side: Secondary | ICD-10-CM | POA: Diagnosis not present

## 2023-09-22 DIAGNOSIS — T8332XA Displacement of intrauterine contraceptive device, initial encounter: Secondary | ICD-10-CM | POA: Diagnosis not present

## 2023-09-22 DIAGNOSIS — Z3201 Encounter for pregnancy test, result positive: Secondary | ICD-10-CM | POA: Diagnosis not present

## 2023-09-26 DIAGNOSIS — F411 Generalized anxiety disorder: Secondary | ICD-10-CM | POA: Diagnosis not present

## 2023-09-27 DIAGNOSIS — F411 Generalized anxiety disorder: Secondary | ICD-10-CM | POA: Diagnosis not present

## 2023-09-28 DIAGNOSIS — N921 Excessive and frequent menstruation with irregular cycle: Secondary | ICD-10-CM | POA: Diagnosis not present

## 2023-09-28 DIAGNOSIS — F411 Generalized anxiety disorder: Secondary | ICD-10-CM | POA: Diagnosis not present

## 2023-09-28 DIAGNOSIS — Z30432 Encounter for removal of intrauterine contraceptive device: Secondary | ICD-10-CM | POA: Diagnosis not present

## 2023-09-28 DIAGNOSIS — T8332XD Displacement of intrauterine contraceptive device, subsequent encounter: Secondary | ICD-10-CM | POA: Diagnosis not present

## 2023-09-29 DIAGNOSIS — F411 Generalized anxiety disorder: Secondary | ICD-10-CM | POA: Diagnosis not present

## 2023-10-03 DIAGNOSIS — J301 Allergic rhinitis due to pollen: Secondary | ICD-10-CM | POA: Diagnosis not present

## 2023-10-05 DIAGNOSIS — F411 Generalized anxiety disorder: Secondary | ICD-10-CM | POA: Diagnosis not present

## 2023-10-06 DIAGNOSIS — J301 Allergic rhinitis due to pollen: Secondary | ICD-10-CM | POA: Diagnosis not present

## 2023-10-09 DIAGNOSIS — F411 Generalized anxiety disorder: Secondary | ICD-10-CM | POA: Diagnosis not present

## 2023-10-10 DIAGNOSIS — F411 Generalized anxiety disorder: Secondary | ICD-10-CM | POA: Diagnosis not present

## 2023-10-11 DIAGNOSIS — Z3202 Encounter for pregnancy test, result negative: Secondary | ICD-10-CM | POA: Diagnosis not present

## 2023-10-11 DIAGNOSIS — Z113 Encounter for screening for infections with a predominantly sexual mode of transmission: Secondary | ICD-10-CM | POA: Diagnosis not present

## 2023-10-11 DIAGNOSIS — Z30430 Encounter for insertion of intrauterine contraceptive device: Secondary | ICD-10-CM | POA: Diagnosis not present

## 2023-10-13 DIAGNOSIS — Z79899 Other long term (current) drug therapy: Secondary | ICD-10-CM | POA: Diagnosis not present

## 2023-10-13 DIAGNOSIS — F901 Attention-deficit hyperactivity disorder, predominantly hyperactive type: Secondary | ICD-10-CM | POA: Diagnosis not present

## 2023-10-13 DIAGNOSIS — F410 Panic disorder [episodic paroxysmal anxiety] without agoraphobia: Secondary | ICD-10-CM | POA: Diagnosis not present

## 2023-10-13 DIAGNOSIS — F3162 Bipolar disorder, current episode mixed, moderate: Secondary | ICD-10-CM | POA: Diagnosis not present

## 2023-10-13 DIAGNOSIS — F411 Generalized anxiety disorder: Secondary | ICD-10-CM | POA: Diagnosis not present

## 2023-10-13 DIAGNOSIS — F4312 Post-traumatic stress disorder, chronic: Secondary | ICD-10-CM | POA: Diagnosis not present

## 2023-10-13 DIAGNOSIS — Z5181 Encounter for therapeutic drug level monitoring: Secondary | ICD-10-CM | POA: Diagnosis not present

## 2023-10-16 DIAGNOSIS — F411 Generalized anxiety disorder: Secondary | ICD-10-CM | POA: Diagnosis not present

## 2023-10-17 DIAGNOSIS — F411 Generalized anxiety disorder: Secondary | ICD-10-CM | POA: Diagnosis not present

## 2023-10-17 DIAGNOSIS — Z419 Encounter for procedure for purposes other than remedying health state, unspecified: Secondary | ICD-10-CM | POA: Diagnosis not present

## 2023-10-18 DIAGNOSIS — F411 Generalized anxiety disorder: Secondary | ICD-10-CM | POA: Diagnosis not present

## 2023-10-25 DIAGNOSIS — F411 Generalized anxiety disorder: Secondary | ICD-10-CM | POA: Diagnosis not present

## 2023-10-26 DIAGNOSIS — F411 Generalized anxiety disorder: Secondary | ICD-10-CM | POA: Diagnosis not present

## 2023-10-29 DIAGNOSIS — F411 Generalized anxiety disorder: Secondary | ICD-10-CM | POA: Diagnosis not present

## 2023-11-01 DIAGNOSIS — F411 Generalized anxiety disorder: Secondary | ICD-10-CM | POA: Diagnosis not present

## 2023-11-02 DIAGNOSIS — F410 Panic disorder [episodic paroxysmal anxiety] without agoraphobia: Secondary | ICD-10-CM | POA: Diagnosis not present

## 2023-11-02 DIAGNOSIS — F3162 Bipolar disorder, current episode mixed, moderate: Secondary | ICD-10-CM | POA: Diagnosis not present

## 2023-11-02 DIAGNOSIS — F4312 Post-traumatic stress disorder, chronic: Secondary | ICD-10-CM | POA: Diagnosis not present

## 2023-11-02 DIAGNOSIS — F411 Generalized anxiety disorder: Secondary | ICD-10-CM | POA: Diagnosis not present

## 2023-11-02 DIAGNOSIS — F901 Attention-deficit hyperactivity disorder, predominantly hyperactive type: Secondary | ICD-10-CM | POA: Diagnosis not present

## 2023-11-08 DIAGNOSIS — F411 Generalized anxiety disorder: Secondary | ICD-10-CM | POA: Diagnosis not present

## 2023-11-09 DIAGNOSIS — F411 Generalized anxiety disorder: Secondary | ICD-10-CM | POA: Diagnosis not present

## 2023-11-14 DIAGNOSIS — F411 Generalized anxiety disorder: Secondary | ICD-10-CM | POA: Diagnosis not present

## 2023-11-17 DIAGNOSIS — Z419 Encounter for procedure for purposes other than remedying health state, unspecified: Secondary | ICD-10-CM | POA: Diagnosis not present

## 2023-11-17 DIAGNOSIS — F411 Generalized anxiety disorder: Secondary | ICD-10-CM | POA: Diagnosis not present

## 2023-11-22 DIAGNOSIS — F411 Generalized anxiety disorder: Secondary | ICD-10-CM | POA: Diagnosis not present

## 2023-11-29 DIAGNOSIS — T8339XA Other mechanical complication of intrauterine contraceptive device, initial encounter: Secondary | ICD-10-CM | POA: Diagnosis not present

## 2023-12-17 DIAGNOSIS — Z419 Encounter for procedure for purposes other than remedying health state, unspecified: Secondary | ICD-10-CM | POA: Diagnosis not present

## 2023-12-22 DIAGNOSIS — T8339XA Other mechanical complication of intrauterine contraceptive device, initial encounter: Secondary | ICD-10-CM | POA: Diagnosis not present

## 2023-12-26 DIAGNOSIS — T8339XD Other mechanical complication of intrauterine contraceptive device, subsequent encounter: Secondary | ICD-10-CM | POA: Diagnosis not present

## 2024-01-05 DIAGNOSIS — E119 Type 2 diabetes mellitus without complications: Secondary | ICD-10-CM | POA: Diagnosis not present

## 2024-01-05 DIAGNOSIS — F3161 Bipolar disorder, current episode mixed, mild: Secondary | ICD-10-CM | POA: Diagnosis not present

## 2024-01-05 DIAGNOSIS — Z794 Long term (current) use of insulin: Secondary | ICD-10-CM | POA: Diagnosis not present

## 2024-01-05 DIAGNOSIS — E782 Mixed hyperlipidemia: Secondary | ICD-10-CM | POA: Diagnosis not present

## 2024-01-05 DIAGNOSIS — J4540 Moderate persistent asthma, uncomplicated: Secondary | ICD-10-CM | POA: Diagnosis not present

## 2024-01-05 DIAGNOSIS — B3731 Acute candidiasis of vulva and vagina: Secondary | ICD-10-CM | POA: Diagnosis not present

## 2024-01-05 DIAGNOSIS — H60333 Swimmer's ear, bilateral: Secondary | ICD-10-CM | POA: Diagnosis not present

## 2024-01-06 DIAGNOSIS — F901 Attention-deficit hyperactivity disorder, predominantly hyperactive type: Secondary | ICD-10-CM | POA: Diagnosis not present

## 2024-01-06 DIAGNOSIS — F3162 Bipolar disorder, current episode mixed, moderate: Secondary | ICD-10-CM | POA: Diagnosis not present

## 2024-01-06 DIAGNOSIS — F411 Generalized anxiety disorder: Secondary | ICD-10-CM | POA: Diagnosis not present

## 2024-01-06 DIAGNOSIS — F410 Panic disorder [episodic paroxysmal anxiety] without agoraphobia: Secondary | ICD-10-CM | POA: Diagnosis not present

## 2024-01-06 DIAGNOSIS — F4312 Post-traumatic stress disorder, chronic: Secondary | ICD-10-CM | POA: Diagnosis not present

## 2024-01-17 DIAGNOSIS — Z419 Encounter for procedure for purposes other than remedying health state, unspecified: Secondary | ICD-10-CM | POA: Diagnosis not present

## 2024-01-17 DIAGNOSIS — J301 Allergic rhinitis due to pollen: Secondary | ICD-10-CM | POA: Diagnosis not present

## 2024-01-19 DIAGNOSIS — J301 Allergic rhinitis due to pollen: Secondary | ICD-10-CM | POA: Diagnosis not present

## 2024-01-23 DIAGNOSIS — F3162 Bipolar disorder, current episode mixed, moderate: Secondary | ICD-10-CM | POA: Diagnosis not present

## 2024-01-23 DIAGNOSIS — Z5181 Encounter for therapeutic drug level monitoring: Secondary | ICD-10-CM | POA: Diagnosis not present

## 2024-02-17 DIAGNOSIS — Z419 Encounter for procedure for purposes other than remedying health state, unspecified: Secondary | ICD-10-CM | POA: Diagnosis not present

## 2024-03-05 DIAGNOSIS — Z5181 Encounter for therapeutic drug level monitoring: Secondary | ICD-10-CM | POA: Diagnosis not present

## 2024-03-05 DIAGNOSIS — F3162 Bipolar disorder, current episode mixed, moderate: Secondary | ICD-10-CM | POA: Diagnosis not present

## 2024-03-05 DIAGNOSIS — Z79899 Other long term (current) drug therapy: Secondary | ICD-10-CM | POA: Diagnosis not present

## 2024-03-05 DIAGNOSIS — F411 Generalized anxiety disorder: Secondary | ICD-10-CM | POA: Diagnosis not present

## 2024-03-05 DIAGNOSIS — F410 Panic disorder [episodic paroxysmal anxiety] without agoraphobia: Secondary | ICD-10-CM | POA: Diagnosis not present

## 2024-03-05 DIAGNOSIS — F4312 Post-traumatic stress disorder, chronic: Secondary | ICD-10-CM | POA: Diagnosis not present

## 2024-03-05 DIAGNOSIS — F901 Attention-deficit hyperactivity disorder, predominantly hyperactive type: Secondary | ICD-10-CM | POA: Diagnosis not present

## 2024-04-16 DIAGNOSIS — F3162 Bipolar disorder, current episode mixed, moderate: Secondary | ICD-10-CM | POA: Diagnosis not present

## 2024-04-24 DIAGNOSIS — H43393 Other vitreous opacities, bilateral: Secondary | ICD-10-CM | POA: Diagnosis not present

## 2024-04-24 DIAGNOSIS — E119 Type 2 diabetes mellitus without complications: Secondary | ICD-10-CM | POA: Diagnosis not present
